# Patient Record
Sex: Female | Born: 1935 | Race: White | Hispanic: No | Smoking: Never smoker
Health system: Southern US, Community
[De-identification: ages and names within clinical notes are randomized; demographics above are authoritative.]

## PROBLEM LIST (undated history)

## (undated) DIAGNOSIS — C50919 Malignant neoplasm of unspecified site of unspecified female breast: Secondary | ICD-10-CM

## (undated) DIAGNOSIS — M810 Age-related osteoporosis without current pathological fracture: Secondary | ICD-10-CM

## (undated) DIAGNOSIS — I499 Cardiac arrhythmia, unspecified: Secondary | ICD-10-CM

## (undated) DIAGNOSIS — I509 Heart failure, unspecified: Secondary | ICD-10-CM

## (undated) DIAGNOSIS — D219 Benign neoplasm of connective and other soft tissue, unspecified: Secondary | ICD-10-CM

## (undated) DIAGNOSIS — N951 Menopausal and female climacteric states: Secondary | ICD-10-CM

## (undated) DIAGNOSIS — I4891 Unspecified atrial fibrillation: Secondary | ICD-10-CM

## (undated) DIAGNOSIS — R0600 Dyspnea, unspecified: Secondary | ICD-10-CM

## (undated) DIAGNOSIS — I2510 Atherosclerotic heart disease of native coronary artery without angina pectoris: Secondary | ICD-10-CM

## (undated) DIAGNOSIS — C801 Malignant (primary) neoplasm, unspecified: Secondary | ICD-10-CM

## (undated) DIAGNOSIS — M1990 Unspecified osteoarthritis, unspecified site: Secondary | ICD-10-CM

## (undated) DIAGNOSIS — M353 Polymyalgia rheumatica: Secondary | ICD-10-CM

## (undated) DIAGNOSIS — I10 Essential (primary) hypertension: Secondary | ICD-10-CM

## (undated) DIAGNOSIS — Z950 Presence of cardiac pacemaker: Secondary | ICD-10-CM

## (undated) HISTORY — DX: Unspecified atrial fibrillation: I48.91

## (undated) HISTORY — DX: Age-related osteoporosis without current pathological fracture: M81.0

## (undated) HISTORY — PX: BASAL CELL CARCINOMA EXCISION: SHX1214

## (undated) HISTORY — PX: INSERT / REPLACE / REMOVE PACEMAKER: SUR710

## (undated) HISTORY — DX: Menopausal and female climacteric states: N95.1

## (undated) HISTORY — DX: Essential (primary) hypertension: I10

## (undated) HISTORY — DX: Benign neoplasm of connective and other soft tissue, unspecified: D21.9

## (undated) HISTORY — DX: Polymyalgia rheumatica: M35.3

## (undated) HISTORY — DX: Malignant (primary) neoplasm, unspecified: C80.1

## (undated) HISTORY — DX: Malignant neoplasm of unspecified site of unspecified female breast: C50.919

## (undated) HISTORY — PX: SKIN BIOPSY: SHX1

## (undated) HISTORY — PX: CARDIAC ELECTROPHYSIOLOGY STUDY AND ABLATION: SHX1294

## (undated) HISTORY — PX: HYSTEROSCOPY: SHX211

---
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
## 1998-02-09 ENCOUNTER — Ambulatory Visit (HOSPITAL_COMMUNITY): Admission: RE | Admit: 1998-02-09 | Discharge: 1998-02-09 | Payer: Self-pay | Admitting: Obstetrics and Gynecology

## 1998-03-22 ENCOUNTER — Other Ambulatory Visit: Admission: RE | Admit: 1998-03-22 | Discharge: 1998-03-22 | Payer: Self-pay | Admitting: Obstetrics and Gynecology

## 1998-06-07 ENCOUNTER — Encounter: Payer: Self-pay | Admitting: Obstetrics and Gynecology

## 1998-06-07 ENCOUNTER — Ambulatory Visit (HOSPITAL_COMMUNITY): Admission: RE | Admit: 1998-06-07 | Discharge: 1998-06-07 | Payer: Self-pay | Admitting: Obstetrics and Gynecology

## 1999-01-17 ENCOUNTER — Other Ambulatory Visit: Admission: RE | Admit: 1999-01-17 | Discharge: 1999-01-17 | Payer: Self-pay | Admitting: Obstetrics and Gynecology

## 1999-09-22 ENCOUNTER — Inpatient Hospital Stay (HOSPITAL_COMMUNITY): Admission: RE | Admit: 1999-09-22 | Discharge: 1999-09-26 | Payer: Self-pay | Admitting: Internal Medicine

## 2000-01-19 ENCOUNTER — Other Ambulatory Visit: Admission: RE | Admit: 2000-01-19 | Discharge: 2000-01-19 | Payer: Self-pay | Admitting: Obstetrics and Gynecology

## 2000-07-24 ENCOUNTER — Ambulatory Visit (HOSPITAL_COMMUNITY): Admission: RE | Admit: 2000-07-24 | Discharge: 2000-07-24 | Payer: Self-pay | Admitting: Internal Medicine

## 2000-07-24 ENCOUNTER — Encounter: Payer: Self-pay | Admitting: Internal Medicine

## 2001-02-21 ENCOUNTER — Other Ambulatory Visit: Admission: RE | Admit: 2001-02-21 | Discharge: 2001-02-21 | Payer: Self-pay | Admitting: Obstetrics and Gynecology

## 2002-02-21 ENCOUNTER — Other Ambulatory Visit: Admission: RE | Admit: 2002-02-21 | Discharge: 2002-02-21 | Payer: Self-pay | Admitting: Obstetrics and Gynecology

## 2003-02-23 ENCOUNTER — Other Ambulatory Visit: Admission: RE | Admit: 2003-02-23 | Discharge: 2003-02-23 | Payer: Self-pay | Admitting: Obstetrics and Gynecology

## 2003-04-04 HISTORY — PX: CARDIAC CATHETERIZATION: SHX172

## 2005-03-02 ENCOUNTER — Other Ambulatory Visit: Admission: RE | Admit: 2005-03-02 | Discharge: 2005-03-02 | Payer: Self-pay | Admitting: Obstetrics and Gynecology

## 2005-12-14 ENCOUNTER — Ambulatory Visit: Payer: Self-pay | Admitting: Internal Medicine

## 2006-03-08 ENCOUNTER — Other Ambulatory Visit: Admission: RE | Admit: 2006-03-08 | Discharge: 2006-03-08 | Payer: Self-pay | Admitting: Obstetrics and Gynecology

## 2006-06-28 ENCOUNTER — Ambulatory Visit: Payer: Self-pay | Admitting: Internal Medicine

## 2006-08-06 ENCOUNTER — Ambulatory Visit: Payer: Self-pay | Admitting: Internal Medicine

## 2007-03-20 ENCOUNTER — Other Ambulatory Visit: Admission: RE | Admit: 2007-03-20 | Discharge: 2007-03-20 | Payer: Self-pay | Admitting: Obstetrics and Gynecology

## 2007-04-22 ENCOUNTER — Encounter: Payer: Self-pay | Admitting: Obstetrics and Gynecology

## 2007-04-22 ENCOUNTER — Ambulatory Visit (HOSPITAL_BASED_OUTPATIENT_CLINIC_OR_DEPARTMENT_OTHER): Admission: RE | Admit: 2007-04-22 | Discharge: 2007-04-22 | Payer: Self-pay | Admitting: Obstetrics and Gynecology

## 2008-07-13 ENCOUNTER — Encounter: Payer: Self-pay | Admitting: Obstetrics and Gynecology

## 2008-07-13 ENCOUNTER — Ambulatory Visit: Payer: Self-pay | Admitting: Obstetrics and Gynecology

## 2008-07-13 ENCOUNTER — Other Ambulatory Visit: Admission: RE | Admit: 2008-07-13 | Discharge: 2008-07-13 | Payer: Self-pay | Admitting: Obstetrics and Gynecology

## 2009-06-22 ENCOUNTER — Ambulatory Visit: Payer: Self-pay | Admitting: Obstetrics and Gynecology

## 2009-06-23 ENCOUNTER — Ambulatory Visit: Payer: Self-pay | Admitting: Obstetrics and Gynecology

## 2009-07-14 ENCOUNTER — Ambulatory Visit: Payer: Self-pay | Admitting: Obstetrics and Gynecology

## 2009-08-31 ENCOUNTER — Ambulatory Visit: Payer: Self-pay | Admitting: Obstetrics and Gynecology

## 2009-10-28 ENCOUNTER — Ambulatory Visit (HOSPITAL_BASED_OUTPATIENT_CLINIC_OR_DEPARTMENT_OTHER): Admission: RE | Admit: 2009-10-28 | Discharge: 2009-10-28 | Payer: Self-pay | Admitting: Obstetrics and Gynecology

## 2009-10-28 ENCOUNTER — Ambulatory Visit: Payer: Self-pay | Admitting: Obstetrics and Gynecology

## 2009-10-31 ENCOUNTER — Inpatient Hospital Stay: Payer: Self-pay | Admitting: Internal Medicine

## 2009-11-03 ENCOUNTER — Ambulatory Visit: Payer: Self-pay | Admitting: Obstetrics and Gynecology

## 2009-12-20 ENCOUNTER — Ambulatory Visit: Payer: Self-pay | Admitting: Unknown Physician Specialty

## 2009-12-22 LAB — PATHOLOGY REPORT

## 2010-06-18 LAB — BASIC METABOLIC PANEL
BUN: 24 mg/dL — ABNORMAL HIGH (ref 6–23)
CO2: 25 mEq/L (ref 19–32)
Calcium: 9.5 mg/dL (ref 8.4–10.5)
Chloride: 107 mEq/L (ref 96–112)
Creatinine, Ser: 0.99 mg/dL (ref 0.4–1.2)
GFR calc Af Amer: >60 mL/min (ref >60)
GFR calc non Af Amer: 54 mL/min — ABNORMAL LOW (ref >60)
Glucose, Bld: 109 mg/dL — ABNORMAL HIGH (ref 70–99)
Potassium: 4.8 mEq/L (ref 3.5–5.1)
Sodium: 138 mEq/L (ref 135–145)

## 2010-06-18 LAB — PROTIME-INR
INR: 1.27 (ref 0.00–1.49)
Prothrombin Time: 15.8 seconds — ABNORMAL HIGH (ref 11.6–15.2)

## 2010-06-18 LAB — POCT HEMOGLOBIN-HEMACUE: Hemoglobin: 15.7 g/dL — ABNORMAL HIGH (ref 12.0–15.0)

## 2010-06-18 LAB — APTT: aPTT: 27 seconds (ref 24–37)

## 2010-08-16 NOTE — Op Note (Signed)
[name], [name]             ACCOUNT NO.:  [id number]   MEDICAL RECORD NO.:  [id number]          PATIENT TYPE:  AMB   LOCATION:  [facility]                         FACILITY:  [facility]   PHYSICIAN:  Daniel L. Gottsegen, M.D.DATE OF BIRTH:  [birth year]   DATE OF PROCEDURE:  04/22/2007  DATE OF DISCHARGE:                               OPERATIVE REPORT   PREOPERATIVE DIAGNOSES:  Postmenopausal bleeding with endometrial polyp.   POSTOPERATIVE DIAGNOSIS:  Postmenopausal bleeding with endometrial  polyp.   OPERATIONS:  Hysteroscopy and excision of endometrial polyp.   SURGEON:  Dr. Eda Paschal.   ANESTHESIA:  General.   INDICATIONS:  The patient is a 77 year old, gravida 3 , para 3, AB 0 who  had presented to the office with postmenopausal bleeding.  She had been  on hormone replacement but had never had this prior. She underwent an  ultrasound in our office which showed an enlarged endometrial echo of  about 9-10 mm and a distinct endometrial polyp in the lower uterine  segment. As a result of this, she was taken to the operating room for  hysteroscopy, excision of polyp and evaluation to be sure there was not  another reason for her postmenopausal bleeding.   FINDINGS:  External is normal. BUS is normal.  Vaginal shows decreased  estrogen effect, cervix is clean.  The uterus is retroverted and small,  adnexa failed to reveal masses.  At the time of hysteroscopy, there was  a small endometrial polyp in the lower uterine segment of approximately  1 to 1-1/2 cm.  It was attached to the anterior wall.  After this was  removed, the top of the fundus, tubal ostia, anterior and posterior  walls of the fundus, lower uterine segment and endocervical canal were  evaluated and were all free of disease.   DESCRIPTION OF PROCEDURE:  After adequate general anesthesia, the  patient was placed in the dorsal lithotomy position, prepped and draped  in the usual sterile manner.  A single-tooth tenaculum  was placed in the  anterior lip of the cervix.  The cervix could be dilated to about a 27  Pratt dilator but after that even using a little more pressure than  normal but being careful not to perforate or lacerate the cervix, we  could not get further dilatation. An attempt at this point then was made  to insert a resectoscope. It could be inserted into the external os but  it could not get through the internal os. Even  trying to follow it in  with the 3% sorbitol running to try to dilate the internal os, it could  not be passed into the endometrial cavity, however, you could start to  see the endometrial polyp in the lower uterine segment when you got  close the internal os. At this point then, the resectoscope was  abandoned and using polyp forceps and a small endometrial curette  the  polyp appeared to be removed. There was significant tissue obtained.  We  then put a diagnostic hysteroscope in and were able to explore the  entire endometrial cavity.  We could seen no pathology  other than the  fact that there was still a small stalk of the polyp that had not been  completely removed with the Randall forceps or with the curette.  We  therefore switched to a [place] diagnostic scope which has a side operating  channel. Through that we could put a grasper and through this we could  grasp the remainder of the polyp stalk and remove it.  There was no  bleeding noted.  The fluid deficit was less than 100 mL.  There was some bleeding however from the single-tooth tenaculum.  Normally this would have probably been treated either with silver  nitrate or just time but because she has to go back on Coumadin we  cauterized this to stop the bleeding.  Total blood loss was minimal.  The patient left the operating room in satisfactory condition.      Daniel L. Eda Paschal, M.D.  Electronically Signed     DLG/MEDQ  D:  04/22/2007  T:  04/22/2007  Job:  [id number]

## 2010-08-19 NOTE — Discharge Summary (Signed)
[facility]  Patient:    [name], [name]                    MRN: [id number] Adm. Date:  [id number] Disc. Date: [id number] Attending:  Nathen May Dictator:   Delton See, P.A. CC:         Nathen May, M.D. [facility]             Diona Fanti, M.D., [place]             Dalia Heading, M.D.                           Discharge Summary  HISTORY ON ADMISSION:  This is a 77 year old female evaluated by Dr. Graciela Husbands in the office on September 06, 1999, for uncontrollable atrial arrhythmias.  Please refer to his complete office notes.  The patient is status post multiple ablations performed at [facility].  She previously was treated with sotalol, but her symptoms continued to worsen.  A decision was made to admit the patient for dofetilide therapy.  The patient is very symptomatic when she has her atrial arrhythmias.  Her symptoms include shortness of breath, chest pressure and pre-syncope.  PAST MEDICAL HISTORY:  Significant for hypertension, history of atrial arrhythmias.  She is status post cardiac catheterization at [facility].  She had a normal left ventricle with normal coronary arteries at that time except for mild irregularities of the LAD.  She has a history of uterine polyps.  She has an ankle fracture, skin cancer and breast biopsies. She had TB treated with INH therapy.  She has been on Coumadin for her atrial arrhythmias.  She also has rheumatoid arthritis.  ALLERGIES:  No known drug allergies.  MEDICATIONS AT TIME OF ADMISSION: 1. Coumadin 2.5 mg daily. 2. Provera 2.5 mg daily. 3. Ogen 0.325 mg daily. 4. Sotalol 120 mg b.i.d. 5. Multivitamins. 6. Folic acid. 7. Metoprolol 50 mg q.d.  SOCIAL HISTORY:  The patient is married.  She lives with her husband.  She is a retired Engineer, civil (consulting).  She does not use tobacco.  She drinks wine on occasion.  REVIEW OF SYSTEMS:  Essentially negative,  except for the symptoms as noted above associated with her rapid atrial arrhythmias.  PHYSICAL EXAMINATION:  GENERAL:  Revealed a pleasant 77 year old white female in no acute distress.  VITAL SIGNS:  Blood pressure 140/100, pulse 97.  HEENT:  Unremarkable.  NECK:  No bruits, no jugular venous distension.  HEART:  Regular rate and rhythm without murmur.  LUNGS:  Clear.  ABDOMEN:  Positive bowel sounds, soft, nontender.  EXTREMITIES:  Revealed pulses to be intact without edema.  HOSPITAL COURSE:  As noted, this patient was to be admitted to [facility] on September 20, 1999, for dofetilide therapy secondary to uncontrolled atrial arrhythmias.  She had been seen in the office by Dr. Graciela Husbands on September 06, 1999, and was seen by Dr. Andee Lineman shortly after admission.  Dr. Andee Lineman did not feel that the treatment with dofetilide was appropriate at this time, although I am not sure why that decision was made.  The patient was discharged in stable condition.  DISCHARGE MEDICATIONS:  To my knowledge the patient was discharged on the same medications she was on at time of admission, please see list as noted above.  IMPRESSION: 1. Uncontrolled atrial arrhythmias status post multiple ablations. 2. History  of normal coronary arteries and normal left ventricle at cardiac    catheterization performed at [facility]. 3. History of Coumadin therapy. 4. Hypertension. 5. Rheumatoid arthritis. 6. Status post multiple surgeries. 7. Remote tuberculosis history treated with appropriate antibiotics. DD:  10/07/99 TD:  10/07/99 Job: [id number]

## 2010-08-19 NOTE — Assessment & Plan Note (Signed)
[facility]                         ELECTROPHYSIOLOGY OFFICE NOTE   HANALEI, GLACE                    MRN:          [id number]  DATE:06/28/2006                            DOB:          [birth year]    [name] comes in today.  She has been feeling some better on her  current medications without feeling like her heart is bottoming out.  This had improved your new discontinuation of her Lanoxin and Toprol in  the fall.  She is back on Toprol 25 mg a day and she feels like her  heart rate does great for the morning and then it starts going fast in  the evening.   She is also concerned about her Coumadin and its impact on her diet,  having excluded most of what she would like to eat and, unfortunately,  she has also intercurrently been diagnosed with polymyalgia rheumatica  and has been put on prednisone and is feeling quite puffy.  She has put  on about 10 pounds, she says because of this, which is true from our  scales from September 2007 but is overall down from December 2003.   Her medication list is lesion and includes aldactone, Coumadin and  prednisone, Toprol 25 mg and a variety of nutraceuticals.   She has no known drug allergies.   PHYSICAL EXAMINATION:  VITAL SIGNS:  On examination today her blood  pressure is 122/78, pulse is 74.  LUNGS:  Clear.  CARDIAC:  Heart sounds were regular.  EXTREMITIES:  Without edema.   Her electrocardiogram demonstrated sinus rhythm at 74 with intervals of  0.13/0.07/0.38.  The axis was mildly leftward at -19.  Her ECG was  consistent with a prior septal infarct, which is old, and she apparently  has had a negative catheterization about 7 years ago.   IMPRESSION:  1. Atrial arrhythmias.  2. History of bradycardia with previously-planned pacemaker      implantation, which was deferred and for which she is now grateful.  3. Anticoagulation for #1 with an impact on her diet.  4. Polymyalgia  rheumatica with a sense of weight gain.   [name] has a variety of issues.  What we will try and do initially  is:  1. Increase her Toprol by having her take a small dose in the evening      at 25 mg to see if we can minimize her p.m. symptoms.  This is done      empirically.  2. Allow her to free up her diet, realizing that it may increase the      Coumadin requirements that she has.  3. Further, she will get her lipids and we will plan to review those      issues concerned about whether she should be on a statin or not.  4. She is planning to go to [place] in early May so we will plan to see      her again in 3 weeks' time.     Duke Salvia, MD, [facility]  Electronically Signed    SCK/MedQ  DD: 06/28/2006  DT: 06/28/2006  Job #: [id number]   cc:   Arnoldo Hooker, M.D.  Lavenia Atlas, M.D.

## 2010-08-19 NOTE — Letter (Signed)
December 14, 2005     Catherine Hooker, MD  [address]  [place], [place]  [address]   RE:  [name], [name]  MRN:  [id number]  /  DOB:  10-12-1926   Dear [name]:   It was a pleasure to [employer] again at your request about her  tachybrady syndrome.   As you know, she is a very pleasant 77 year old woman who with her husband  both think the world of you.  She has a long standing history of atrial  arrhythmias and underwent a complex ablation procedure with [facility] Chester,  MD down at [facility] years ago.  This was ultimately felt to be unsuccessful and  she was tried on a series of antiarrhythmic drugs to control her symptoms  for tachycardia.  She had some bradycardia but this was relatively  quiescent.  She was tried 1C agents as well as dofetilide;  ultimately these  were discontinued a couple of years back and she had been relatively symptom  free for the last 3-years.  She is able to play tennis, walk on her  treadmill, swim.  However, a couple of weeks ago she started having spells  of presyncope.  Holter monitor was undertaken which demonstrated rates  ranging from the 140s down to the 40s.  She was seen in your office at a 3.5  second pause as noted with sinus node dysfunction. Review of their tracings  demonstrates continued problems with this atrial arrhythmia with upright P-  waves in lead V1 and inferiorly consistent with a left atrial focus.  She  also has atrial fibrillations still.   You stopped her Toprol and her Lanoxin earlier this week and she has had no  recurrent spells of her light headedness.   She has no chest pain or shortness of breath.  She has long-standing  hypertension.   PAST MEDICAL HISTORY:  Her past medical history is otherwise largely  negative.   FAMILY HISTORY:  She comes from a long-standing stock with her parents  living to a total age of 190 years between the two of them.   PAST SURGICAL HISTORY:  Her past surgical history  is noted for breast  biopsy.   REVIEW OF SYSTEMS:  Non-contributory. It is notable for a remote history of  TB treated with INH.   CURRENT MEDICATIONS:  Include:  1. Aldactone 25.  2. Coumadin.  3. Fosamax.  4. Vitamin C.   ALLERGIES:  No known drug allergies.   EXAMINATION:  VITAL SIGNS:  Her blood pressure today was 136/93.  Her pulse  is 93.  HEENT:  Demonstrates no icterus or xanthomas.  NECK:  Neck veins were flat.  Carotids were brisk and full bilaterally  without bruits.  BACK:  Without kyphosis or scoliosis.  LUNGS:  Clear.  HEART:  Heart sounds were irregular with a 2/6 systolic murmur.  ABDOMEN:  Soft with active bowel sounds.  EXTREMITIES:  Femoral pulses were 2+, distal pulses were intact.  There is  no clubbing, cyanosis or edema.  NEUROLOGICAL:  Grossly normal.   Electrocardiograms from your office dated December 12, 2005,  demonstrated  left atrial flutter with P wave morphology as described above  with an  average heart rate of 75.  Strips from the day before on the December 11, 2005, demonstrated recurrent pausing in the setting of sinus rhythm.  Holter  monitoring from September 2005 demonstrated the rapid rates and slow rates  as previously described.   IMPRESSION:  1. Recurrent light headedness associated with documented sinus node      dysfunction and sinus pauses.  2. Tachycardia - multiple - status post ablation  of multiple arrhythmias      at [facility] with multiple residual arrhythmias and failed entry into drug      therapy.  3. Hypertension.   [name] has tachybrady and I concur with your thoughts that  tachybrady pacing is currently indicated.  She has had symptomatic tachy  palpitations in the past and has been remarkably well-controlled on a  combination of Toprol and Lanoxin; the fact that she is now having problems  with bradycardia, I think is probably evolution of her sinus node  dysfunction and backup brady pacing I think  is the only way to allow for  control of what will likely be symptomatic tachycardia but is already  concerning tachycardia with her heart rates on therapy, previously  demonstrated in early September to be over 130 or 140.   I have reviewed extensively with her and her husband the potential benefits  as well as potential risks including but not limited to death, perforation,  infection and lead dislodgement.  They understand these risks and would like  to proceed.  I stressed that this was a semi-elective procedure so they  could feel free to go to the beach for the week, that the bradycardia is the  more problematic portion of her tachybrady and that as long as she was not  having symptoms of light-headedness that  would put her at risk for falling  down, her tachy palpitations could be tolerated   Thank you very much for asking me to see her and we will plan to implant her  pacer the first week of October.    Sincerely,      Catherine Salvia, MD, [facility]   SCK/MedQ  DD:  12/14/2005  DT:  12/15/2005  Job #:  [id number]   CC:    [name]

## 2010-08-19 NOTE — Discharge Summary (Signed)
[name]  Patient:    [name], [name]                    MRN: [id number] Adm. Date:  [id number] Disc. Date: 09/26/99 Attending:  Nathen May Dictator:   Abelino Derrick, P.A.C. [facility] CC:         Nathen May, M.D., [facility]             Iantha Fallen A. Lady Gary, M.D., [address], [place], [place]                           Discharge Summary  DISCHARGE DIAGNOSES: 1. Atrial fibrillation, dofetilide instituted this admission. 2. Hypertension 3. Nonobstructive coronary disease by previous catheterization. 4. History of TB in the past treated with INH.  HOSPITAL COURSE:  The patient is a 77 year old female, followed by Dr. Lady Gary with atrial arrhythmias.  She was on sotalol most recently.  She was admitted September 22, 1999, for dofetilide therapy.  The patient was admitted to telemetry and started on Tikosyn per the pharmacy protocol.  She tolerated this well.  Dr. Graciela Husbands feels she can be discharged September 26, 1999.  We did add Aldactone to her medicines and increased her Toprol to 100 mg a day.  Her potassium was low during this admission and replaced but she needs a followup potassium check later this week at Dr. Don Perking office.  She will follow up with Dr. Graciela Husbands on October 13, 1999, at 2:15 p.m.  Her QTC at discharge is 453.  DISCHARGE MEDICATIONS: 1. Toprol XL 100 mg a day. 2. Aldactone 25 mg a day. 3. Dofetilide 250 mg twice a day. 4. Coumadin 2.5 mg a day. 5. Folic acid 1 mg a day. 6. Premarin 0.625 q. day. 7. Provera 2.5 q. day.  LABORATORY DATA:  Sodium 137, potassium 3.8, BUN 21, creatinine 0.8, INR is 2.7, white count 5.5. Hemoglobin 14.7, hematocrit 42.7, platelets 160.  DISPOSITION:  The patient is discharge in stable condition and will followup with Dr. Graciela Husbands on October 11, 1999, at 2:15 p.m., and have a potassium check later this week at Dr. Don Perking office. DD:  09/26/99 TD:  09/26/99 Job: [id number]

## 2010-08-19 NOTE — Procedures (Signed)
[place]  Patient:    [name], [name]                    MRN: [id number] Proc. Date: 07/24/00 Adm. Date:  [id number] Attending:  Nathen May                           Procedure Report  PREOPERATIVE DIAGNOSIS:  Atrial tachycardia.  POSTOPERATIVE DIAGNOSIS:  Sinus rhythm.  The patient was admitted for DC cardioversion.  The patients INR was 3.4.  Patient received a total of 150 mg of sodium pentothal under the care of Dr. Kipp Brood.  A 20-joule shock was delivered synchronously during atrial tachycardia and sinus rhythm was restored.  The patient was awakened at this time. DD:  07/24/00 TD:  07/24/00 Job: [id number]

## 2010-12-22 LAB — PROTIME-INR
INR: 1.3
Prothrombin Time: 16.5 — ABNORMAL HIGH

## 2010-12-22 LAB — BASIC METABOLIC PANEL
CO2: 27
Calcium: 9.5
Creatinine, Ser: 0.83
GFR calc Af Amer: >60
GFR calc non Af Amer: >60
Sodium: 142

## 2011-02-06 ENCOUNTER — Encounter: Payer: Self-pay | Admitting: Gynecology

## 2011-02-06 DIAGNOSIS — N951 Menopausal and female climacteric states: Secondary | ICD-10-CM | POA: Insufficient documentation

## 2011-02-20 ENCOUNTER — Other Ambulatory Visit (HOSPITAL_COMMUNITY)
Admission: RE | Admit: 2011-02-20 | Discharge: 2011-02-20 | Disposition: A | Discharge status: Home / Self Care | Payer: Medicare Other | Source: Ambulatory Visit | Attending: Obstetrics and Gynecology | Admitting: Obstetrics and Gynecology

## 2011-02-20 ENCOUNTER — Ambulatory Visit (INDEPENDENT_AMBULATORY_CARE_PROVIDER_SITE_OTHER): Payer: Medicare Other | Admitting: Obstetrics and Gynecology

## 2011-02-20 ENCOUNTER — Encounter: Payer: Self-pay | Admitting: Obstetrics and Gynecology

## 2011-02-20 VITALS — BP 124/74 | Ht 62.0 in | Wt 148.0 lb

## 2011-02-20 DIAGNOSIS — N950 Postmenopausal bleeding: Secondary | ICD-10-CM

## 2011-02-20 DIAGNOSIS — M899 Disorder of bone, unspecified: Secondary | ICD-10-CM

## 2011-02-20 DIAGNOSIS — M8580 Other specified disorders of bone density and structure, unspecified site: Secondary | ICD-10-CM

## 2011-02-20 DIAGNOSIS — R3911 Hesitancy of micturition: Secondary | ICD-10-CM

## 2011-02-20 DIAGNOSIS — Z124 Encounter for screening for malignant neoplasm of cervix: Secondary | ICD-10-CM | POA: Insufficient documentation

## 2011-02-20 DIAGNOSIS — N951 Menopausal and female climacteric states: Secondary | ICD-10-CM

## 2011-02-20 DIAGNOSIS — I4891 Unspecified atrial fibrillation: Secondary | ICD-10-CM

## 2011-02-20 DIAGNOSIS — Z780 Asymptomatic menopausal state: Secondary | ICD-10-CM

## 2011-02-20 MED ORDER — ESTRADIOL-LEVONORGESTREL 0.045-0.015 MG/DAY TD PTWK: 1.0000 | MEDICATED_PATCH | TRANSDERMAL | Status: DC

## 2011-02-20 NOTE — Progress Notes (Signed)
Subjective:     Patient ID: [name], female   DOB: Apr 14, 1926, 77 y.o.   MRN: [id number]  HPIpatient came back to see me today. When I last saw her she had postmenopausal bleeding. She had a previous endometrial polyp. This time when she had the bleeding hysteroscopy and D&C failed to reveal pathology. We stopped her HRT. The bleeding stopped. We thoroughly some of those related to her anticoagulation for atrial fibrillation. She now is having menopausal symptoms. She is a lack of energy. She doesn't feel as good. She doesn't really have night sweats. She is having no vaginal dryness. She wonders if she could go back on HRT. She also notices that her stream of urination isn't as strong. She has no dysuria, frequency, or urgency. She's had no hematuria. She was concerned that her bladder had dropped causing the above. She is due for a mammogram. She is due for bone density. She has low bone mass. She takes her calcium and vitamin D. She's had no fracture. She's had no bleeding now for one year vaginally.   Review of Systems  Constitutional: Negative.   HENT: Negative.   Eyes: Negative.   Respiratory: Negative.   Cardiovascular:       Atrial fibrillation treated with Cardizem and coumadin  Gastrointestinal: Negative.   Genitourinary:       See history of present illness  Musculoskeletal: Negative.   Skin: Negative.   Neurological: Negative.   Hematological: Negative.   Psychiatric/Behavioral: Negative.        Objective:   Physical ExamPhysical examination: Kennon Portela present HEENT within normal limits. Neck: Thyroid not large. No masses. Supraclavicular nodes: not enlarged. Breasts: Examined in both sitting midline position. No skin changes and no masses. Abdomen: Soft no guarding rebound or masses or hernia. Pelvic: External: Within normal limits. BUS: Within normal limits. Vaginal:within normal limits. Poor estrogen effect. No evidence of cystocele rectocele or enterocele.  Cervix: clean. Uterus: Normal size and shape. Adnexa: No masses. Rectovaginal exam: Confirmatory and negative. Extremities: Within normal limits.    Assessment:    #1. Menopausal symptoms #2. Postmenopausal bleeding #3. Mild atrophic vaginitis #4. Low bone mass #5. Loss of stream with urination    Plan:     we discussed in detail reinitiating HRT. All risks including DVT, CVA, and breast cancer discussed. Patient would like to try it. We started her on Climara pro. She was warned about bleeding. She was scheduled bone density and mammogram. She will see a urologist regarding her loss of urine stream.

## 2011-03-16 ENCOUNTER — Telehealth: Payer: Self-pay | Admitting: *Deleted

## 2011-03-16 NOTE — Telephone Encounter (Signed)
Telephone call to review problem of bleeding for 3 days, changing pad every 2-3 hours dark blood. Had been off hormones x1 year with no bleeding. Started back on Climara pro patch one month ago for symptom relief. States has felt better on HRT but does not like bleeding. Also on Coumadin. Due to put a new patch on tomorrow, will take off patch today, will see if bleeding slows or stops. Will call tomorrow with an update. Reviewed may need medication to make bleeding stop.

## 2011-03-16 NOTE — Telephone Encounter (Signed)
Pt called c/o bleeding x 3 days now. Last office visit on 02/20/11 and was given climarapro 0.045-0.015 mg to take. The bleeding is heavy but not gusting blood, no cramping nor other symptoms. Pt wants to know if she should continue the patch or stop? Please advise

## 2011-03-17 ENCOUNTER — Other Ambulatory Visit: Payer: Self-pay | Admitting: Women's Health

## 2011-03-17 MED ORDER — MEGESTROL ACETATE 20 MG PO TABS: 20.0000 mg | ORAL_TABLET | Freq: Two times a day (BID) | ORAL | Status: AC

## 2011-03-17 NOTE — Telephone Encounter (Signed)
Telephone call for followup. States continues to bleed changing a pad every 2-3 hours. No clots blood is mostly dark. Has taken Megace in the past without a problem. Megace 20 mg twice a day until bleeding stops. Will followup with Dr. Eda Paschal for HRT. Instructed to call if bleeding does not stop.

## 2011-03-20 NOTE — Telephone Encounter (Signed)
Telephone call for followup. States bleeding stopped yesterday after 2 days of Megace. None today states is feeling much better. Will continue  Megace for 5 days. Will schedule followup with Dr. Eda Paschal or call if bleeding would start again.

## 2011-03-30 ENCOUNTER — Encounter: Payer: Self-pay | Admitting: Obstetrics and Gynecology

## 2011-04-04 DIAGNOSIS — Z23 Encounter for immunization: Secondary | ICD-10-CM | POA: Diagnosis not present

## 2011-04-24 DIAGNOSIS — I4891 Unspecified atrial fibrillation: Secondary | ICD-10-CM | POA: Diagnosis not present

## 2011-04-24 DIAGNOSIS — E538 Deficiency of other specified B group vitamins: Secondary | ICD-10-CM | POA: Diagnosis not present

## 2011-05-03 DIAGNOSIS — I4891 Unspecified atrial fibrillation: Secondary | ICD-10-CM | POA: Diagnosis not present

## 2011-06-22 DIAGNOSIS — I4891 Unspecified atrial fibrillation: Secondary | ICD-10-CM | POA: Diagnosis not present

## 2011-07-10 DIAGNOSIS — Z23 Encounter for immunization: Secondary | ICD-10-CM | POA: Diagnosis not present

## 2011-07-10 DIAGNOSIS — I10 Essential (primary) hypertension: Secondary | ICD-10-CM | POA: Diagnosis not present

## 2011-07-10 DIAGNOSIS — E538 Deficiency of other specified B group vitamins: Secondary | ICD-10-CM | POA: Diagnosis not present

## 2011-07-10 DIAGNOSIS — I4891 Unspecified atrial fibrillation: Secondary | ICD-10-CM | POA: Diagnosis not present

## 2011-07-10 DIAGNOSIS — M129 Arthropathy, unspecified: Secondary | ICD-10-CM | POA: Diagnosis not present

## 2011-07-11 DIAGNOSIS — I10 Essential (primary) hypertension: Secondary | ICD-10-CM | POA: Diagnosis not present

## 2011-07-11 DIAGNOSIS — M810 Age-related osteoporosis without current pathological fracture: Secondary | ICD-10-CM | POA: Diagnosis not present

## 2011-07-11 DIAGNOSIS — M129 Arthropathy, unspecified: Secondary | ICD-10-CM | POA: Diagnosis not present

## 2011-07-11 DIAGNOSIS — E538 Deficiency of other specified B group vitamins: Secondary | ICD-10-CM | POA: Diagnosis not present

## 2011-07-11 DIAGNOSIS — I4891 Unspecified atrial fibrillation: Secondary | ICD-10-CM | POA: Diagnosis not present

## 2011-08-17 DIAGNOSIS — E538 Deficiency of other specified B group vitamins: Secondary | ICD-10-CM | POA: Diagnosis not present

## 2011-08-21 DIAGNOSIS — I4891 Unspecified atrial fibrillation: Secondary | ICD-10-CM | POA: Diagnosis not present

## 2011-09-14 DIAGNOSIS — I059 Rheumatic mitral valve disease, unspecified: Secondary | ICD-10-CM | POA: Diagnosis not present

## 2011-09-14 DIAGNOSIS — E782 Mixed hyperlipidemia: Secondary | ICD-10-CM | POA: Diagnosis not present

## 2011-09-14 DIAGNOSIS — I4891 Unspecified atrial fibrillation: Secondary | ICD-10-CM | POA: Diagnosis not present

## 2011-09-14 DIAGNOSIS — I4892 Unspecified atrial flutter: Secondary | ICD-10-CM | POA: Diagnosis not present

## 2011-09-14 DIAGNOSIS — I10 Essential (primary) hypertension: Secondary | ICD-10-CM | POA: Diagnosis not present

## 2011-09-19 DIAGNOSIS — E538 Deficiency of other specified B group vitamins: Secondary | ICD-10-CM | POA: Diagnosis not present

## 2011-10-20 DIAGNOSIS — E538 Deficiency of other specified B group vitamins: Secondary | ICD-10-CM | POA: Diagnosis not present

## 2011-11-06 DIAGNOSIS — I4891 Unspecified atrial fibrillation: Secondary | ICD-10-CM | POA: Diagnosis not present

## 2011-11-23 DIAGNOSIS — E538 Deficiency of other specified B group vitamins: Secondary | ICD-10-CM | POA: Diagnosis not present

## 2011-12-14 DIAGNOSIS — I4891 Unspecified atrial fibrillation: Secondary | ICD-10-CM | POA: Diagnosis not present

## 2012-01-02 ENCOUNTER — Other Ambulatory Visit: Payer: Self-pay | Admitting: *Deleted

## 2012-01-02 DIAGNOSIS — M8580 Other specified disorders of bone density and structure, unspecified site: Secondary | ICD-10-CM

## 2012-01-03 DIAGNOSIS — Z23 Encounter for immunization: Secondary | ICD-10-CM | POA: Diagnosis not present

## 2012-01-08 DIAGNOSIS — E538 Deficiency of other specified B group vitamins: Secondary | ICD-10-CM | POA: Diagnosis not present

## 2012-01-08 DIAGNOSIS — I4891 Unspecified atrial fibrillation: Secondary | ICD-10-CM | POA: Diagnosis not present

## 2012-01-09 DIAGNOSIS — M949 Disorder of cartilage, unspecified: Secondary | ICD-10-CM | POA: Diagnosis not present

## 2012-01-09 DIAGNOSIS — M899 Disorder of bone, unspecified: Secondary | ICD-10-CM | POA: Diagnosis not present

## 2012-01-09 DIAGNOSIS — Z1231 Encounter for screening mammogram for malignant neoplasm of breast: Secondary | ICD-10-CM | POA: Diagnosis not present

## 2012-01-11 ENCOUNTER — Encounter: Payer: Self-pay | Admitting: Obstetrics and Gynecology

## 2012-01-11 ENCOUNTER — Other Ambulatory Visit: Payer: Self-pay | Admitting: Obstetrics and Gynecology

## 2012-01-11 DIAGNOSIS — M8580 Other specified disorders of bone density and structure, unspecified site: Secondary | ICD-10-CM

## 2012-01-29 DIAGNOSIS — L578 Other skin changes due to chronic exposure to nonionizing radiation: Secondary | ICD-10-CM | POA: Diagnosis not present

## 2012-01-29 DIAGNOSIS — L820 Inflamed seborrheic keratosis: Secondary | ICD-10-CM | POA: Diagnosis not present

## 2012-01-29 DIAGNOSIS — D485 Neoplasm of uncertain behavior of skin: Secondary | ICD-10-CM | POA: Diagnosis not present

## 2012-01-29 DIAGNOSIS — L821 Other seborrheic keratosis: Secondary | ICD-10-CM | POA: Diagnosis not present

## 2012-01-29 DIAGNOSIS — Z85828 Personal history of other malignant neoplasm of skin: Secondary | ICD-10-CM | POA: Diagnosis not present

## 2012-02-19 DIAGNOSIS — E538 Deficiency of other specified B group vitamins: Secondary | ICD-10-CM | POA: Diagnosis not present

## 2012-02-21 ENCOUNTER — Ambulatory Visit (INDEPENDENT_AMBULATORY_CARE_PROVIDER_SITE_OTHER): Payer: Medicare Other | Admitting: Obstetrics and Gynecology

## 2012-02-21 ENCOUNTER — Encounter: Payer: Self-pay | Admitting: Obstetrics and Gynecology

## 2012-02-21 VITALS — BP 120/76 | Ht 62.0 in | Wt 147.0 lb

## 2012-02-21 DIAGNOSIS — R5381 Other malaise: Secondary | ICD-10-CM

## 2012-02-21 DIAGNOSIS — I10 Essential (primary) hypertension: Secondary | ICD-10-CM | POA: Insufficient documentation

## 2012-02-21 DIAGNOSIS — R5383 Other fatigue: Secondary | ICD-10-CM | POA: Diagnosis not present

## 2012-02-21 DIAGNOSIS — N950 Postmenopausal bleeding: Secondary | ICD-10-CM

## 2012-02-21 DIAGNOSIS — Z780 Asymptomatic menopausal state: Secondary | ICD-10-CM

## 2012-02-21 DIAGNOSIS — M810 Age-related osteoporosis without current pathological fracture: Secondary | ICD-10-CM | POA: Diagnosis not present

## 2012-02-21 MED ORDER — MEDROXYPROGESTERONE ACETATE 2.5 MG PO TABS: 2.5000 mg | ORAL_TABLET | Freq: Every day | ORAL | Status: DC

## 2012-02-21 MED ORDER — ESTRADIOL 0.025 MG/24HR TD PTWK: 1.0000 | MEDICATED_PATCH | TRANSDERMAL | Status: DC

## 2012-02-21 NOTE — Patient Instructions (Signed)
We will call you with lab work 

## 2012-02-21 NOTE — Progress Notes (Signed)
Patient came back to see me today for further follow up. She stopped her HRT after all bleeding issues that we went through.( see record). She is having no bleeding now but does not feel as good off her HRT.She notices more fatigue, joint pain and feels she is less focused. She is having no pelvic pain. Her last bone density showed continued bone loss that was statistically significant. Her diagnosis is osteoporosis with a T score of -2.5. She had been on Fosamax for greater than 10  years. She has been on drug holiday for 3 years. She has had no fractures. She has never had an abnormal Pap smear. Her last Pap smear was 2012.She just had her yearly mammogram. She continues to be on Coumadin due to atrial fibrillation.  She is generally taking 2 mg daily. She is having no dysuria, urgency of urination, or frequency of urination.  ROS: 12 system review done. Pertinent positives above.Other positive is hypertension.  Physical examination:[name] present. HEENT within normal limits. Neck: Thyroid not large. No masses. Supraclavicular nodes: not enlarged. Breasts: Examined in both sitting and lying  position. No skin changes and no masses. Abdomen: Soft no guarding rebound or masses or hernia. Pelvic: External: Within normal limits. BUS: Within normal limits. Vaginal:within normal limits. Good estrogen effect. No evidence of cystocele rectocele or enterocele. Cervix: clean. Uterus: Normal size and shape. Adnexa: No masses. Rectovaginal exam: Confirmatory and negative. Extremities: Within normal limits.  Assessment: #1. Postmenopausal bleeding-now resolved #2. Menopausal symptoms including fatigue #3. Osteoporosis  Plan: Appropriate lab done for her osteoporosis. Recommend Prolia. We will get her approved after lab work. Written information on Prolia given. Discussed issues with HRT at 85 including DVT, stroke and increased risk of breast cancer. Elected to restart her on the lowest dose estrogen patch  with daily progestin. She will not start it without her cardiologist's consent. Pap not done.The new Pap smear guidelines were discussed with the patient. Consider estrogen-serm when available next year Although that would be oral estrogen.

## 2012-02-22 LAB — VITAMIN D 25 HYDROXY (VIT D DEFICIENCY, FRACTURES): Vit D, 25-Hydroxy: 40 ng/mL (ref 30–89)

## 2012-02-23 ENCOUNTER — Other Ambulatory Visit: Payer: Self-pay | Admitting: Obstetrics and Gynecology

## 2012-02-23 DIAGNOSIS — M898X9 Other specified disorders of bone, unspecified site: Secondary | ICD-10-CM

## 2012-02-28 ENCOUNTER — Other Ambulatory Visit: Payer: Medicare Other

## 2012-02-28 DIAGNOSIS — M948X9 Other specified disorders of cartilage, unspecified sites: Secondary | ICD-10-CM | POA: Diagnosis not present

## 2012-02-28 DIAGNOSIS — M898X9 Other specified disorders of bone, unspecified site: Secondary | ICD-10-CM

## 2012-03-01 LAB — PTH, INTACT AND CALCIUM: PTH: 145.2 pg/mL — ABNORMAL HIGH (ref 14.0–72.0)

## 2012-03-05 ENCOUNTER — Telehealth: Payer: Self-pay | Admitting: *Deleted

## 2012-03-05 NOTE — Telephone Encounter (Signed)
Office notes faxed to [facility] physicians for endocrinologist per Dr.G result note. For elevated PTH level.

## 2012-03-12 DIAGNOSIS — I4891 Unspecified atrial fibrillation: Secondary | ICD-10-CM | POA: Diagnosis not present

## 2012-03-12 DIAGNOSIS — I10 Essential (primary) hypertension: Secondary | ICD-10-CM | POA: Diagnosis not present

## 2012-03-12 DIAGNOSIS — I4892 Unspecified atrial flutter: Secondary | ICD-10-CM | POA: Diagnosis not present

## 2012-03-12 DIAGNOSIS — I495 Sick sinus syndrome: Secondary | ICD-10-CM | POA: Diagnosis not present

## 2012-03-12 DIAGNOSIS — I059 Rheumatic mitral valve disease, unspecified: Secondary | ICD-10-CM | POA: Diagnosis not present

## 2012-03-13 NOTE — Telephone Encounter (Signed)
Spoke with care coordinator at office and pt does not have appointment scheduled yet, care coordinator has pt on cancellation list and will contact pt when OV set.

## 2012-03-20 NOTE — Telephone Encounter (Signed)
Pt appt on Jan 13th @ 3:00 pm

## 2012-03-22 DIAGNOSIS — E538 Deficiency of other specified B group vitamins: Secondary | ICD-10-CM | POA: Diagnosis not present

## 2012-04-09 DIAGNOSIS — I4891 Unspecified atrial fibrillation: Secondary | ICD-10-CM | POA: Diagnosis not present

## 2012-04-15 DIAGNOSIS — I4891 Unspecified atrial fibrillation: Secondary | ICD-10-CM | POA: Diagnosis not present

## 2012-04-15 DIAGNOSIS — E215 Disorder of parathyroid gland, unspecified: Secondary | ICD-10-CM | POA: Diagnosis not present

## 2012-04-15 DIAGNOSIS — M810 Age-related osteoporosis without current pathological fracture: Secondary | ICD-10-CM | POA: Diagnosis not present

## 2012-04-30 DIAGNOSIS — E538 Deficiency of other specified B group vitamins: Secondary | ICD-10-CM | POA: Diagnosis not present

## 2012-04-30 DIAGNOSIS — I4891 Unspecified atrial fibrillation: Secondary | ICD-10-CM | POA: Diagnosis not present

## 2012-06-12 DIAGNOSIS — I4891 Unspecified atrial fibrillation: Secondary | ICD-10-CM | POA: Diagnosis not present

## 2012-06-12 DIAGNOSIS — E538 Deficiency of other specified B group vitamins: Secondary | ICD-10-CM | POA: Diagnosis not present

## 2012-07-01 DIAGNOSIS — L578 Other skin changes due to chronic exposure to nonionizing radiation: Secondary | ICD-10-CM | POA: Diagnosis not present

## 2012-07-01 DIAGNOSIS — L820 Inflamed seborrheic keratosis: Secondary | ICD-10-CM | POA: Diagnosis not present

## 2012-07-01 DIAGNOSIS — Z85828 Personal history of other malignant neoplasm of skin: Secondary | ICD-10-CM | POA: Diagnosis not present

## 2012-07-01 DIAGNOSIS — L821 Other seborrheic keratosis: Secondary | ICD-10-CM | POA: Diagnosis not present

## 2012-07-01 DIAGNOSIS — D485 Neoplasm of uncertain behavior of skin: Secondary | ICD-10-CM | POA: Diagnosis not present

## 2012-07-01 DIAGNOSIS — C44319 Basal cell carcinoma of skin of other parts of face: Secondary | ICD-10-CM | POA: Diagnosis not present

## 2012-07-16 DIAGNOSIS — I4891 Unspecified atrial fibrillation: Secondary | ICD-10-CM | POA: Diagnosis not present

## 2012-07-16 DIAGNOSIS — E538 Deficiency of other specified B group vitamins: Secondary | ICD-10-CM | POA: Diagnosis not present

## 2012-08-21 DIAGNOSIS — E538 Deficiency of other specified B group vitamins: Secondary | ICD-10-CM | POA: Diagnosis not present

## 2012-08-21 DIAGNOSIS — I4891 Unspecified atrial fibrillation: Secondary | ICD-10-CM | POA: Diagnosis not present

## 2012-09-24 DIAGNOSIS — I4891 Unspecified atrial fibrillation: Secondary | ICD-10-CM | POA: Diagnosis not present

## 2012-09-24 DIAGNOSIS — I119 Hypertensive heart disease without heart failure: Secondary | ICD-10-CM | POA: Diagnosis not present

## 2012-09-24 DIAGNOSIS — E782 Mixed hyperlipidemia: Secondary | ICD-10-CM | POA: Diagnosis not present

## 2012-09-24 DIAGNOSIS — I4892 Unspecified atrial flutter: Secondary | ICD-10-CM | POA: Diagnosis not present

## 2012-10-13 DIAGNOSIS — I495 Sick sinus syndrome: Secondary | ICD-10-CM | POA: Diagnosis not present

## 2012-10-13 DIAGNOSIS — I214 Non-ST elevation (NSTEMI) myocardial infarction: Secondary | ICD-10-CM | POA: Diagnosis not present

## 2012-10-13 DIAGNOSIS — I4891 Unspecified atrial fibrillation: Secondary | ICD-10-CM | POA: Diagnosis not present

## 2012-10-13 DIAGNOSIS — R0789 Other chest pain: Secondary | ICD-10-CM | POA: Diagnosis not present

## 2012-10-13 DIAGNOSIS — R079 Chest pain, unspecified: Secondary | ICD-10-CM | POA: Diagnosis not present

## 2012-10-13 DIAGNOSIS — Z950 Presence of cardiac pacemaker: Secondary | ICD-10-CM | POA: Diagnosis not present

## 2012-10-13 DIAGNOSIS — Z7901 Long term (current) use of anticoagulants: Secondary | ICD-10-CM | POA: Diagnosis not present

## 2012-10-13 DIAGNOSIS — I10 Essential (primary) hypertension: Secondary | ICD-10-CM | POA: Diagnosis not present

## 2012-10-13 DIAGNOSIS — R110 Nausea: Secondary | ICD-10-CM | POA: Diagnosis not present

## 2012-11-04 DIAGNOSIS — I4891 Unspecified atrial fibrillation: Secondary | ICD-10-CM | POA: Diagnosis not present

## 2012-12-18 DIAGNOSIS — I4891 Unspecified atrial fibrillation: Secondary | ICD-10-CM | POA: Diagnosis not present

## 2013-01-09 DIAGNOSIS — I4891 Unspecified atrial fibrillation: Secondary | ICD-10-CM | POA: Diagnosis not present

## 2013-01-21 DIAGNOSIS — Z1231 Encounter for screening mammogram for malignant neoplasm of breast: Secondary | ICD-10-CM | POA: Diagnosis not present

## 2013-01-23 ENCOUNTER — Encounter: Payer: Self-pay | Admitting: Gynecology

## 2013-01-28 DIAGNOSIS — L820 Inflamed seborrheic keratosis: Secondary | ICD-10-CM | POA: Diagnosis not present

## 2013-01-28 DIAGNOSIS — L821 Other seborrheic keratosis: Secondary | ICD-10-CM | POA: Diagnosis not present

## 2013-01-28 DIAGNOSIS — D239 Other benign neoplasm of skin, unspecified: Secondary | ICD-10-CM | POA: Diagnosis not present

## 2013-01-28 DIAGNOSIS — Z85828 Personal history of other malignant neoplasm of skin: Secondary | ICD-10-CM | POA: Diagnosis not present

## 2013-01-28 DIAGNOSIS — L578 Other skin changes due to chronic exposure to nonionizing radiation: Secondary | ICD-10-CM | POA: Diagnosis not present

## 2013-02-24 ENCOUNTER — Encounter: Payer: Self-pay | Admitting: Gynecology

## 2013-02-24 ENCOUNTER — Ambulatory Visit (INDEPENDENT_AMBULATORY_CARE_PROVIDER_SITE_OTHER): Payer: Medicare Other | Admitting: Gynecology

## 2013-02-24 VITALS — BP 122/78 | Ht 63.0 in | Wt 155.0 lb

## 2013-02-24 DIAGNOSIS — N952 Postmenopausal atrophic vaginitis: Secondary | ICD-10-CM | POA: Diagnosis not present

## 2013-02-24 DIAGNOSIS — M810 Age-related osteoporosis without current pathological fracture: Secondary | ICD-10-CM

## 2013-02-24 DIAGNOSIS — K649 Unspecified hemorrhoids: Secondary | ICD-10-CM | POA: Diagnosis not present

## 2013-02-24 DIAGNOSIS — N951 Menopausal and female climacteric states: Secondary | ICD-10-CM

## 2013-02-24 NOTE — Progress Notes (Signed)
[name] [birth year] [id number]        79 y.o.  G3P3003 for followup exam.  Former patient of Dr. Eda Paschal. Several issues noted below.  Past medical history,surgical history, problem list, medications, allergies, family history and social history were all reviewed and documented in the EPIC chart.  ROS:  Performed and pertinent positives and negatives are included in the history, assessment and plan .  Exam: Kim assistant Filed Vitals:   02/24/13 1449  BP: 122/78  Height: 5\' 3"  (1.6 m)  Weight: 155 lb (70.308 kg)   General appearance  Normal Skin grossly normal Head/Neck normal with no cervical or supraclavicular adenopathy thyroid normal Lungs  clear Cardiac RR, without RMG Abdominal  soft, nontender, without masses, organomegaly or hernia Breasts  examined lying and sitting without masses, retractions, discharge or axillary adenopathy. Pelvic  Ext/BUS/vagina  normal with atrophic changes  Cervix  normal with atrophic changes  Uterus  anteverted, normal size, shape and contour, midline and mobile nontender   Adnexa  Without masses or tenderness    Anus and perineum  normal   Rectovaginal  normal sphincter tone without palpated masses or tenderness. Sternal hemorrhoids   Assessment/Plan:  79 y.o. G3P3003 female for followup exam.   1. Menopausal/menopausal symptoms.  Patient complains of menopausal symptoms. Had been on HRT but discontinued due to irregular bleeding. Still having some hot flashes and night sweats. We've reviewed the risks benefits of HRT and at age 14 with atrial fibrillation on Coumadin it was more prudent to stay off of this and she agrees. 2. Osteoporosis. Most recent DEXA 2013 with T score -2.5. Does have a history of being on Fosamax for greater than 10 years and was on a 3 year drug-free holiday. She showed deterioration on a followup DEXA. She discussed Prolia with Dr. Eda Paschal ended up not starting this. I reviewed her fracture risk and my  recommendation to consider Prolia. Risks/benefits reviewed. Osteonecrosis of the jaw, atypical fracture, rashes and infections reviewed. Patient wants to go ahead and start we'll make arrangements to do so. 3. External hemorrhoids. Not overly bothersome to the patient. We'll continue to monitor. 4. Mammography 01/2013. Continue with annual mammography. SBE monthly reviewed. 5. Pap smear 2012. No Pap smear done today. No history of significant abnormal Pap smears. Current screening guidelines discussed options to stop screening altogether as she is over the age of 65% less frequent screening intervals discussed. We'll readdress on an annual basis. 6. Colonoscopy 2011. Repeat at their recommended interval. 7. Health maintenance. No blood work done as this is done through her other physician's offices. Followup one year, sooner as needed.   Note: This document was prepared with digital dictation and possible smart phrase technology. Any transcriptional errors that result from this process are unintentional.   Catherine Lords MD, 3:29 PM 02/24/2013

## 2013-02-24 NOTE — Patient Instructions (Signed)
Office will call you to arrange for Prolia. Followup in one year for annual exam.

## 2013-03-05 ENCOUNTER — Encounter: Payer: Self-pay | Admitting: Obstetrics and Gynecology

## 2013-03-13 ENCOUNTER — Telehealth: Payer: Self-pay | Admitting: *Deleted

## 2013-03-13 NOTE — Telephone Encounter (Signed)
Pt was called with Prolia benefits. Pts estimated out of pocket is $0 except for deductible cost which has been met. No PA needed. Pt will call back to schedule. [facility]

## 2013-03-20 DIAGNOSIS — I119 Hypertensive heart disease without heart failure: Secondary | ICD-10-CM | POA: Diagnosis not present

## 2013-03-20 DIAGNOSIS — I059 Rheumatic mitral valve disease, unspecified: Secondary | ICD-10-CM | POA: Diagnosis not present

## 2013-03-20 DIAGNOSIS — I4891 Unspecified atrial fibrillation: Secondary | ICD-10-CM | POA: Diagnosis not present

## 2013-03-20 DIAGNOSIS — R0602 Shortness of breath: Secondary | ICD-10-CM | POA: Diagnosis not present

## 2013-03-20 DIAGNOSIS — I4892 Unspecified atrial flutter: Secondary | ICD-10-CM | POA: Diagnosis not present

## 2013-04-01 ENCOUNTER — Ambulatory Visit: Payer: Medicare Other

## 2013-04-03 DIAGNOSIS — M810 Age-related osteoporosis without current pathological fracture: Secondary | ICD-10-CM

## 2013-04-03 HISTORY — DX: Age-related osteoporosis without current pathological fracture: M81.0

## 2013-04-11 DIAGNOSIS — Z23 Encounter for immunization: Secondary | ICD-10-CM | POA: Diagnosis not present

## 2013-04-16 DIAGNOSIS — M1710 Unilateral primary osteoarthritis, unspecified knee: Secondary | ICD-10-CM | POA: Diagnosis not present

## 2013-04-16 DIAGNOSIS — R0602 Shortness of breath: Secondary | ICD-10-CM | POA: Diagnosis not present

## 2013-04-16 DIAGNOSIS — IMO0002 Reserved for concepts with insufficient information to code with codable children: Secondary | ICD-10-CM | POA: Diagnosis not present

## 2013-04-28 DIAGNOSIS — I4891 Unspecified atrial fibrillation: Secondary | ICD-10-CM | POA: Diagnosis not present

## 2013-04-28 DIAGNOSIS — L659 Nonscarring hair loss, unspecified: Secondary | ICD-10-CM | POA: Diagnosis not present

## 2013-04-29 DIAGNOSIS — IMO0002 Reserved for concepts with insufficient information to code with codable children: Secondary | ICD-10-CM | POA: Diagnosis not present

## 2013-04-29 DIAGNOSIS — M1710 Unilateral primary osteoarthritis, unspecified knee: Secondary | ICD-10-CM | POA: Diagnosis not present

## 2013-06-17 DIAGNOSIS — M129 Arthropathy, unspecified: Secondary | ICD-10-CM | POA: Diagnosis not present

## 2013-06-17 DIAGNOSIS — M810 Age-related osteoporosis without current pathological fracture: Secondary | ICD-10-CM | POA: Diagnosis not present

## 2013-06-17 DIAGNOSIS — I4891 Unspecified atrial fibrillation: Secondary | ICD-10-CM | POA: Diagnosis not present

## 2013-06-17 DIAGNOSIS — I10 Essential (primary) hypertension: Secondary | ICD-10-CM | POA: Diagnosis not present

## 2013-06-17 DIAGNOSIS — E538 Deficiency of other specified B group vitamins: Secondary | ICD-10-CM | POA: Diagnosis not present

## 2013-06-18 DIAGNOSIS — I4891 Unspecified atrial fibrillation: Secondary | ICD-10-CM | POA: Diagnosis not present

## 2013-06-18 DIAGNOSIS — E538 Deficiency of other specified B group vitamins: Secondary | ICD-10-CM | POA: Diagnosis not present

## 2013-06-18 DIAGNOSIS — I10 Essential (primary) hypertension: Secondary | ICD-10-CM | POA: Diagnosis not present

## 2013-07-21 DIAGNOSIS — I4891 Unspecified atrial fibrillation: Secondary | ICD-10-CM | POA: Diagnosis not present

## 2013-09-03 DIAGNOSIS — I4891 Unspecified atrial fibrillation: Secondary | ICD-10-CM | POA: Diagnosis not present

## 2013-09-19 DIAGNOSIS — N189 Chronic kidney disease, unspecified: Secondary | ICD-10-CM | POA: Insufficient documentation

## 2013-09-19 DIAGNOSIS — IMO0001 Reserved for inherently not codable concepts without codable children: Secondary | ICD-10-CM | POA: Insufficient documentation

## 2013-09-19 DIAGNOSIS — Z0389 Encounter for observation for other suspected diseases and conditions ruled out: Secondary | ICD-10-CM

## 2013-09-25 DIAGNOSIS — N189 Chronic kidney disease, unspecified: Secondary | ICD-10-CM | POA: Diagnosis not present

## 2013-09-25 DIAGNOSIS — I4891 Unspecified atrial fibrillation: Secondary | ICD-10-CM | POA: Diagnosis not present

## 2013-09-25 DIAGNOSIS — I38 Endocarditis, valve unspecified: Secondary | ICD-10-CM | POA: Diagnosis not present

## 2013-09-25 DIAGNOSIS — I10 Essential (primary) hypertension: Secondary | ICD-10-CM | POA: Diagnosis not present

## 2013-09-25 DIAGNOSIS — I495 Sick sinus syndrome: Secondary | ICD-10-CM | POA: Diagnosis not present

## 2013-10-31 DIAGNOSIS — I4891 Unspecified atrial fibrillation: Secondary | ICD-10-CM | POA: Diagnosis not present

## 2013-12-16 DIAGNOSIS — E538 Deficiency of other specified B group vitamins: Secondary | ICD-10-CM | POA: Diagnosis not present

## 2013-12-16 DIAGNOSIS — I10 Essential (primary) hypertension: Secondary | ICD-10-CM | POA: Diagnosis not present

## 2013-12-16 DIAGNOSIS — I4891 Unspecified atrial fibrillation: Secondary | ICD-10-CM | POA: Diagnosis not present

## 2014-01-20 ENCOUNTER — Other Ambulatory Visit: Payer: Self-pay | Admitting: *Deleted

## 2014-01-20 DIAGNOSIS — M8580 Other specified disorders of bone density and structure, unspecified site: Secondary | ICD-10-CM

## 2014-01-20 DIAGNOSIS — M810 Age-related osteoporosis without current pathological fracture: Secondary | ICD-10-CM | POA: Insufficient documentation

## 2014-01-27 DIAGNOSIS — I481 Persistent atrial fibrillation: Secondary | ICD-10-CM | POA: Diagnosis not present

## 2014-01-27 DIAGNOSIS — I10 Essential (primary) hypertension: Secondary | ICD-10-CM | POA: Diagnosis not present

## 2014-01-27 DIAGNOSIS — E785 Hyperlipidemia, unspecified: Secondary | ICD-10-CM | POA: Diagnosis not present

## 2014-01-27 DIAGNOSIS — E875 Hyperkalemia: Secondary | ICD-10-CM | POA: Diagnosis not present

## 2014-01-27 DIAGNOSIS — I4891 Unspecified atrial fibrillation: Secondary | ICD-10-CM | POA: Diagnosis not present

## 2014-01-27 DIAGNOSIS — M810 Age-related osteoporosis without current pathological fracture: Secondary | ICD-10-CM | POA: Diagnosis not present

## 2014-01-27 LAB — PROTIME-INR

## 2014-01-28 DIAGNOSIS — M810 Age-related osteoporosis without current pathological fracture: Secondary | ICD-10-CM | POA: Diagnosis not present

## 2014-01-28 DIAGNOSIS — Z1231 Encounter for screening mammogram for malignant neoplasm of breast: Secondary | ICD-10-CM | POA: Diagnosis not present

## 2014-02-02 ENCOUNTER — Encounter: Payer: Self-pay | Admitting: Gynecology

## 2014-02-02 DIAGNOSIS — M25511 Pain in right shoulder: Secondary | ICD-10-CM | POA: Diagnosis not present

## 2014-02-02 DIAGNOSIS — M75101 Unspecified rotator cuff tear or rupture of right shoulder, not specified as traumatic: Secondary | ICD-10-CM | POA: Diagnosis not present

## 2014-02-02 DIAGNOSIS — M170 Bilateral primary osteoarthritis of knee: Secondary | ICD-10-CM | POA: Diagnosis not present

## 2014-02-02 DIAGNOSIS — M7541 Impingement syndrome of right shoulder: Secondary | ICD-10-CM | POA: Insufficient documentation

## 2014-02-03 ENCOUNTER — Other Ambulatory Visit: Payer: Self-pay | Admitting: *Deleted

## 2014-02-03 DIAGNOSIS — M8580 Other specified disorders of bone density and structure, unspecified site: Secondary | ICD-10-CM

## 2014-02-23 DIAGNOSIS — I4891 Unspecified atrial fibrillation: Secondary | ICD-10-CM | POA: Diagnosis not present

## 2014-02-23 LAB — PROTIME-INR

## 2014-03-02 ENCOUNTER — Encounter: Payer: Self-pay | Admitting: Gynecology

## 2014-03-02 ENCOUNTER — Ambulatory Visit (INDEPENDENT_AMBULATORY_CARE_PROVIDER_SITE_OTHER): Payer: Medicare Other | Admitting: Gynecology

## 2014-03-02 VITALS — BP 120/76 | Ht 62.0 in | Wt 143.0 lb

## 2014-03-02 DIAGNOSIS — N952 Postmenopausal atrophic vaginitis: Secondary | ICD-10-CM | POA: Diagnosis not present

## 2014-03-02 DIAGNOSIS — M810 Age-related osteoporosis without current pathological fracture: Secondary | ICD-10-CM | POA: Diagnosis not present

## 2014-03-02 MED ORDER — DENOSUMAB 60 MG/ML ~~LOC~~ SOLN
60.0000 mg | Freq: Once | SUBCUTANEOUS | Status: AC
  Administered 2014-03-02: 60 mg via SUBCUTANEOUS

## 2014-03-02 NOTE — Patient Instructions (Signed)
Office will call you in follow up of your lab results and possible referral to endocrinology.

## 2014-03-02 NOTE — Addendum Note (Signed)
Addended by: Nelva Nay on: 03/02/2014 10:34 AM   Modules accepted: Orders

## 2014-03-02 NOTE — Progress Notes (Signed)
[name] 04-26-1926 [id number]        80 y.o.  G3P3003 for follow up exam. Several issues noted below.  Past medical history,surgical history, problem list, medications, allergies, family history and social history were all reviewed and documented as reviewed in the EPIC chart.  ROS:  12 system ROS performed with pertinent positives and negatives included in the history, assessment and plan.   Additional significant findings :  none   Exam: Kim Counsellor Vitals:   03/02/14 0924  BP: 120/76  Height: 5\' 2"  (1.575 m)  Weight: 143 lb (64.864 kg)   General appearance:  Normal affect, orientation and appearance. Skin: Grossly normal HEENT: Without gross lesions.  No cervical or supraclavicular adenopathy. Thyroid normal.  Lungs:  Clear without wheezing, rales or rhonchi Cardiac: RR, without RMG Abdominal:  Soft, nontender, without masses, guarding, rebound, organomegaly or hernia Breasts:  Examined lying and sitting without masses, retractions, discharge or axillary adenopathy. Pelvic:  Ext/BUS/vagina with generalized atrophic changes  Cervix with atrophic changes  Uterus anteverted, normal size, shape and contour, midline and mobile nontender   Adnexa  Without masses or tenderness    Anus and perineum  Normal   Rectovaginal  Normal sphincter tone without palpated masses or tenderness.    Assessment/Plan:  80 y.o. G64P3003 female for follow up exam.   1. Postmenopausal/atrophic genital changes.  Patient continues to have some menopausal symptoms. We again discussed the issues of HRT and risks. Given her other medical issues and anticoagulation we both agree to monitor at present.  She's had no vaginal bleeding and knows importance to report any vaginal bleeding. 2. Osteoporosis. Most recent DEXA 01/2014 T score -2.9. Is going to have her first Prolia injection today.  Patient does have a history of elevated PTH at 145 2013. Saw Dr. Dwyane Dee historically as recommended by  Dr.Gottsegen. She said he had recommended starting Prolia at that point but no further workup otherwise. Will recheck her PTH TSH and vitamin D level today. If still elevated then we'll recommend that she see endocrinology again and I will refer her to Dr. Cruzita Lederer. Patient will follow up in 6 months for her next Prolia injection.  Plan repeat DEXA at two-year interval area 3. Mammography 01/2014. Continue with annual mammography. SBE monthly reviewed. 4. Pap smear 2012. No Pap smear done today. No history of significant abnormal Pap smears. Reviewed current screening guidelines and we both agreed to stop screening. 5. Colonoscopy 2011. Repeat at their recommended interval. 6. Health maintenance. No routine blood work done as this is done at her primary physician's office. Follow up above blood work results and possible referral to endocrinology, otherwise 6 months for her next Prolia shot.     Anastasio Auerbach MD, 10:22 AM 03/02/2014

## 2014-03-03 ENCOUNTER — Telehealth: Payer: Self-pay | Admitting: *Deleted

## 2014-03-03 DIAGNOSIS — M170 Bilateral primary osteoarthritis of knee: Secondary | ICD-10-CM | POA: Insufficient documentation

## 2014-03-03 DIAGNOSIS — M1712 Unilateral primary osteoarthritis, left knee: Secondary | ICD-10-CM | POA: Diagnosis not present

## 2014-03-03 LAB — PTH, INTACT AND CALCIUM
Calcium: 9.8 mg/dL (ref 8.4–10.5)
PTH: 127 pg/mL — ABNORMAL HIGH (ref 14–64)

## 2014-03-03 LAB — VITAMIN D 25 HYDROXY (VIT D DEFICIENCY, FRACTURES): Vit D, 25-Hydroxy: 29 ng/mL — ABNORMAL LOW (ref 30–100)

## 2014-03-03 LAB — TSH: TSH: 2.283 u[IU]/mL (ref 0.350–4.500)

## 2014-03-03 NOTE — Telephone Encounter (Signed)
(  late documentation) Pt called to get Prolia benefits prior to having her injection which she requests to get at visit on 03/02/14 with Dr Phineas Real. She has Medicare that does cover it after the deductible met then has a [employer] plan that also covers it without prior authorization request. And her BCBS Supplement covers all expenses that Medicare doesn't cover. Per Renaye Rakers and Margarette Asal at [place]. Pt will receive injection on 03/02/14 KW CMA

## 2014-03-04 ENCOUNTER — Telehealth: Payer: Self-pay | Admitting: *Deleted

## 2014-03-04 DIAGNOSIS — R7989 Other specified abnormal findings of blood chemistry: Secondary | ICD-10-CM

## 2014-03-04 NOTE — Telephone Encounter (Signed)
-----   Message from Catherine Auerbach, MD sent at 03/04/2014  7:48 AM EST ----- Tell patient her PTH hormone is still elevated. Her vitamin D is mildly low. Recommend OTC vitamin D 2000 units daily and to stay on this. I would also recommend a follow up appointment to see the endocrinologist as we discussed. I would like her to have an appointment to see Dr. Cruzita Lederer.

## 2014-03-04 NOTE — Telephone Encounter (Signed)
Left message for pt to call.

## 2014-03-12 DIAGNOSIS — I482 Chronic atrial fibrillation: Secondary | ICD-10-CM | POA: Diagnosis not present

## 2014-03-12 DIAGNOSIS — I071 Rheumatic tricuspid insufficiency: Secondary | ICD-10-CM | POA: Diagnosis not present

## 2014-03-12 DIAGNOSIS — I480 Paroxysmal atrial fibrillation: Secondary | ICD-10-CM | POA: Diagnosis not present

## 2014-03-12 DIAGNOSIS — I10 Essential (primary) hypertension: Secondary | ICD-10-CM | POA: Diagnosis not present

## 2014-03-13 NOTE — Telephone Encounter (Signed)
Pt informed with the below note, referral placed for [place] office they will contact pt to schedule.

## 2014-03-30 DIAGNOSIS — M170 Bilateral primary osteoarthritis of knee: Secondary | ICD-10-CM | POA: Diagnosis not present

## 2014-04-06 NOTE — Telephone Encounter (Signed)
appointment 04/10/14 @ 1:15pm

## 2014-04-09 DIAGNOSIS — I4891 Unspecified atrial fibrillation: Secondary | ICD-10-CM | POA: Diagnosis not present

## 2014-04-10 ENCOUNTER — Encounter: Payer: Self-pay | Admitting: Internal Medicine

## 2014-04-10 ENCOUNTER — Ambulatory Visit (INDEPENDENT_AMBULATORY_CARE_PROVIDER_SITE_OTHER): Payer: Medicare Other | Admitting: Internal Medicine

## 2014-04-10 VITALS — BP 112/72 | HR 99 | Temp 97.6°F | Resp 12 | Ht 63.0 in | Wt 145.0 lb

## 2014-04-10 DIAGNOSIS — E213 Hyperparathyroidism, unspecified: Secondary | ICD-10-CM

## 2014-04-10 LAB — BASIC METABOLIC PANEL
BUN: 25 mg/dL — AB (ref 6–23)
CHLORIDE: 109 meq/L (ref 96–112)
CO2: 26 mEq/L (ref 19–32)
Calcium: 9.1 mg/dL (ref 8.4–10.5)
Creatinine, Ser: 1.1 mg/dL (ref 0.4–1.2)
GFR: 51.74 mL/min — ABNORMAL LOW (ref >60.00)
Glucose, Bld: 93 mg/dL (ref 70–99)
POTASSIUM: 5.1 meq/L (ref 3.5–5.1)
SODIUM: 141 meq/L (ref 135–145)

## 2014-04-10 LAB — PHOSPHORUS: Phosphorus: 3.1 mg/dL (ref 2.3–4.6)

## 2014-04-10 LAB — MAGNESIUM: Magnesium: 2.3 mg/dL (ref 1.5–2.5)

## 2014-04-10 NOTE — Patient Instructions (Addendum)
Please stop at the lab.  Please come back for a follow-up appointment in 6 months  I will let you know if we need the urine collection. Patient information (Up-to-Date): Collection of a 24-hour urine specimen  - You should collect every drop of urine during each 24-hour period. It does not matter how much or little urine is passed each time, as long as every drop is collected. - Begin the urine collection in the morning after you wake up, after you have emptied your bladder for the first time. - Urinate (empty the bladder) for the first time and flush it down the toilet. Note the exact time (eg, 6:15 AM). You will begin the urine collection at this time. - Collect every drop of urine during the day and night in an empty collection bottle. Store the bottle at room temperature or in the refrigerator. - If you need to have a bowel movement, any urine passed with the bowel movement should be collected. Try not to include feces with the urine collection. If feces does get mixed in, do not try to remove the feces from the urine collection bottle. - Finish by collecting the first urine passed the next morning, adding it to the collection bottle. This should be within ten minutes before or after the time of the first morning void on the first day (which was flushed). In this example, you would try to void between 6:05 and 6:25 on the second day.  Please only take 1 calcium tablet a day, and take this with a meal.  Continue Vitamin D3 2000 units daily. - If you need to urinate one hour before the final collection time, drink a full glass of water so that you can void again at the appropriate time. If you have to urinate 20 minutes before, try to hold the urine until the proper time. - Please note the exact time of the final collection, even if it is not the same time as when collection began on day 1. - The bottle(s) may be kept at room temperature for a day or two, but should be kept cool or refrigerated  for longer periods of time.

## 2014-04-10 NOTE — Progress Notes (Signed)
Patient ID: [name], female   DOB: 04-Feb-1927, 81 y.o.   MRN: [id number]   HPI  [name] is a 81 y.o.-year-old female, referred by Dr. Phineas Real, in consultation for normocalcemic hyperparathyroidism.  Pt was dx with hyperparathyroidism in 2013. Calcium levels always mid-normal range. I reviewed pt's pertinent labs: Lab Results  Component Value Date   PTH 127* 03/02/2014   PTH 145.2* 02/28/2012   PTH 138.0* 02/21/2012   CALCIUM 9.8 03/02/2014   CALCIUM 9.0 02/28/2012   CALCIUM 9.8 02/21/2012   CALCIUM 9.5 10/28/2009   CALCIUM 9.5 04/22/2007   Pt has a h/o OP. I reviewed pt's DEXA scans: Date L1-L3 T score FN T score 33% distal Radius  01/28/2014 -2.9 (-5.7%*) LFN: -2.6 RFN: -2.1 n/a  01/09/2012 -2.5 (-5.8%*) LFN: -2.3 RFN: -1.8 n/a  She was recently started on Prolia - got 1 infusion (Dr Phineas Real)  No fractures or falls.   No h/o kidney stones.  No h/o CKD. Mosr recent BUN/Cr: 12/16/2013: BUN/Cr 25/1.1 (GFR 47); Ca 9.9; K 5.3 Lab Results  Component Value Date   BUN 24* 10/28/2009   CREATININE 0.99 10/28/2009   Pt is not on HCTZ.  Has a h/o mild vitamin D deficiency. Last vit D level was: Component     Latest Ref Rng 02/21/2012 03/02/2014  Vit D, 25-Hydroxy     30 - 100 ng/mL 40 29 (L)   Pt is on calcium-D 1200-? mg at bedtime and vitamin D >> just started 2000 IU /day, prev. On 1000 IU/day; she also eats dairy and green, leafy, vegetables. She takes a Magnesium supplement.   Pt does not have a FH of hypercalcemia, pituitary tumors, thyroid cancer. Has FH of osteoporosis in father. Mother had kidney stones.  ROS: Constitutional: no weight gain/loss, no fatigue, no subjective hyperthermia/hypothermia Eyes: no blurry vision, no xerophthalmia ENT: no sore throat, no nodules palpated in throat, no dysphagia/odynophagia, no hoarseness, + decreased hearing, + poor sleep Cardiovascular: no CP/+ SOB/+ palpitations/no leg swelling Respiratory: no cough/+  SOB Gastrointestinal: no N/V/D/C Musculoskeletal: + both: muscle/joint aches Skin: no rashes, + hair loss Neurological: no tremors/numbness/tingling/dizziness Psychiatric: no depression/anxiety  Past Medical History  Diagnosis Date  . Menopausal symptoms   . Atrial fibrillation   . Polymyalgia   . Cancer     Basal cell  . Fibroid   . Osteoporosis 2015    T score -2.9   Past Surgical History  Procedure Laterality Date  . Breast surgery      Breast Bx  . Cardiac electrophysiology study and ablation    . Basal cell carcinoma excision    . Hysteroscopy      X 2  . Skin biopsy    . Cardiac catheterization     History   Social History  . Marital Status: Married    Spouse Name: N/A    Number of Children: 3   Occupational History  . Retired Therapist, sports   Social History Main Topics  . Smoking status: Never Smoker   . Smokeless tobacco: Not on file  . Alcohol Use: 2.0 oz/week    2-3 drink(s) per week  . Drug Use: No   Current Outpatient Prescriptions on File Prior to Visit  Medication Sig Dispense Refill  . BIOTIN PO Take by mouth.    . Calcium Carbonate-Vitamin D (CALCIUM + D PO) Take by mouth.      . Cholecalciferol (VITAMIN D PO) Take by mouth.      . denosumab (  PROLIA) 60 MG/ML SOLN injection Inject 60 mg into the skin every 6 (six) months. Administer in upper arm, thigh, or abdomen    . diltiazem (CARDIZEM) 120 MG tablet Take 120 mg by mouth daily.      Marland Kitchen Spironolactone (ALDACTONE PO) Take by mouth.      . Warfarin Sodium (COUMADIN PO) Take by mouth.       No current facility-administered medications on file prior to visit.   No Known Allergies Family History  Problem Relation Age of Onset  . Hypertension Mother   . Heart disease Mother   . Heart disease Father   . Osteoporosis Father    PE: BP 112/72 mmHg  Pulse 99  Temp(Src) 97.6 F (36.4 C) (Oral)  Resp 12  Ht 5\' 3"  (1.6 m)  Wt 145 lb (65.772 kg)  BMI 25.69 kg/m2  SpO2 96% Wt Readings from Last 3  Encounters:  04/10/14 145 lb (65.772 kg)  03/02/14 143 lb (64.864 kg)  02/24/13 155 lb (70.308 kg)   Constitutional: normal weight, in NAD. No kyphosis. Eyes: PERRLA, EOMI, no exophthalmos ENT: moist mucous membranes, no thyromegaly, no cervical lymphadenopathy Cardiovascular: RRR, No MRG Respiratory: CTA B Gastrointestinal: abdomen soft, NT, ND, BS+ Musculoskeletal: no deformities, strength intact in all 4 Skin: moist, warm, no rashes, + varicose veins bilat. Neurological: no tremor with outstretched hands, DTR normal in all 4  Assessment: 1. Normocalcemic hyperparathyroidism  Plan: Patient has had several instances of elevated PTH level, but with normal calcium levels. - she has mild vitamin D deficiency, however, not severe enough to explain the PTH elevation - No apparent complications from hypercalcemia: no h/o nephrolithiasis, no AP, no fractures, but does have osteoporosis. - I discussed with the patient about the physiology of calcium and parathyroid hormone, and possible side effects from increased PTH, including kidney stones, osteoporosis, abdominal pain, etc.  - We discussed that we need to check whether his hyperparathyroidism is primary (Familial hypercalcemic hypocalciuria or parathyroid adenoma) or secondary (to conditions like: vitamin D deficiency, calcium malabsorption, hypercalciuria, renal insufficiency, etc.). - per review of her chart, she does not have CKD, she only has mild vit D def >> since then she increased her vit D intake to double (2000 IU vit D3 daily), no h/o malabsorptive ds, or kidney stones. - If she has primary hyperparathyroidism, criteria for parathyroid surgery are:  Increased calcium by more than 1 mg/dL above the upper limit of normal  Kidney ds.  Osteoporosis (or Vb fx) - she has this Age <92 years old New (2013): High UCa >400 mg/d and increased stone risk by biochemical stone risk analysis Presence of nephrolithiasis or  nephrocalcinosis Pt's preference!  - I will also check: calcium level intact PTH ([facility] assay) Magnesium Phosphorus vitamin D- 25 HO and 1,25 HO  24h urinary calcium/creatinine ratio - if iPTH by the [facility] assay is high  - pt given instructions for urine collection and the jug - We discussed possible consequences of hyperparathyroidism: ~1/3 pts will develop complications over 15 years (OP, nephrolithiasis).  - However, the benefit of parathyroidectomy in a 81 y/o woman without elevated calcium are not clear. I d/w the pt to check the above labs and decide about the plan when these are back. If this is a case of primary hyperparathyroidism, we may do better to just follow her clinically and with labs. I agree with Prolia for her osteoporosis.  - I will see the patient back in 6 months, I will discuss  through my chart or by phone  Component     Latest Ref Rng 04/10/2014  Sodium     135 - 145 mEq/L 141  Potassium     3.5 - 5.1 mEq/L 5.1  Chloride     96 - 112 mEq/L 109  CO2     19 - 32 mEq/L 26  Glucose     70 - 99 mg/dL 93  BUN     6 - 23 mg/dL 25 (H)  Creatinine     0.4 - 1.2 mg/dL 1.1  Calcium     8.4 - 10.5 mg/dL 9.1  GFR     >60.00 mL/min 51.74 (L)  PTH     15 - 65 pg/mL 64  VITD     30.00 - 100.00 ng/mL 31.10  Magnesium     1.5 - 2.5 mg/dL 2.3  Phosphorus     2.3 - 4.6 mg/dL 3.1   PTH and calcium normal. Vit D low-normal. Mg and Phosphorus normal. Even though the possibility of a small parathyroid adenoma is not 100% excluded, I would not recommend Sx in this case. I will continue to follow her. Vit D 1,25 diHO-vitD and UCa still pending, but these will not change the plan.

## 2014-04-11 LAB — PARATHYROID HORMONE, INTACT (NO CA): PTH: 64 pg/mL (ref 15–65)

## 2014-04-13 ENCOUNTER — Encounter: Payer: Self-pay | Admitting: *Deleted

## 2014-04-13 LAB — VITAMIN D 1,25 DIHYDROXY
VITAMIN D3 1, 25 (OH): 66 pg/mL
Vitamin D 1, 25 (OH)2 Total: 66 pg/mL (ref 18–72)
Vitamin D2 1, 25 (OH)2: <8 pg/mL

## 2014-04-13 LAB — VITAMIN D 25 HYDROXY (VIT D DEFICIENCY, FRACTURES): VITD: 31.1 ng/mL (ref 30.00–100.00)

## 2014-04-27 ENCOUNTER — Encounter: Payer: Self-pay | Admitting: Internal Medicine

## 2014-05-20 DIAGNOSIS — I4891 Unspecified atrial fibrillation: Secondary | ICD-10-CM | POA: Diagnosis not present

## 2014-05-26 DIAGNOSIS — D2371 Other benign neoplasm of skin of right lower limb, including hip: Secondary | ICD-10-CM | POA: Diagnosis not present

## 2014-05-26 DIAGNOSIS — L570 Actinic keratosis: Secondary | ICD-10-CM | POA: Diagnosis not present

## 2014-05-26 DIAGNOSIS — X32XXXA Exposure to sunlight, initial encounter: Secondary | ICD-10-CM | POA: Diagnosis not present

## 2014-05-26 DIAGNOSIS — L820 Inflamed seborrheic keratosis: Secondary | ICD-10-CM | POA: Diagnosis not present

## 2014-05-26 DIAGNOSIS — L821 Other seborrheic keratosis: Secondary | ICD-10-CM | POA: Diagnosis not present

## 2014-05-26 DIAGNOSIS — D485 Neoplasm of uncertain behavior of skin: Secondary | ICD-10-CM | POA: Diagnosis not present

## 2014-06-18 DIAGNOSIS — M7541 Impingement syndrome of right shoulder: Secondary | ICD-10-CM | POA: Diagnosis not present

## 2014-06-18 DIAGNOSIS — M25511 Pain in right shoulder: Secondary | ICD-10-CM | POA: Diagnosis not present

## 2014-06-18 DIAGNOSIS — M7501 Adhesive capsulitis of right shoulder: Secondary | ICD-10-CM | POA: Diagnosis not present

## 2014-06-18 DIAGNOSIS — M75101 Unspecified rotator cuff tear or rupture of right shoulder, not specified as traumatic: Secondary | ICD-10-CM | POA: Diagnosis not present

## 2014-06-23 DIAGNOSIS — I4891 Unspecified atrial fibrillation: Secondary | ICD-10-CM | POA: Diagnosis not present

## 2014-07-29 DIAGNOSIS — M150 Primary generalized (osteo)arthritis: Secondary | ICD-10-CM | POA: Diagnosis not present

## 2014-07-29 DIAGNOSIS — I10 Essential (primary) hypertension: Secondary | ICD-10-CM | POA: Diagnosis not present

## 2014-07-29 DIAGNOSIS — E538 Deficiency of other specified B group vitamins: Secondary | ICD-10-CM | POA: Diagnosis not present

## 2014-07-29 DIAGNOSIS — M353 Polymyalgia rheumatica: Secondary | ICD-10-CM | POA: Diagnosis not present

## 2014-07-29 DIAGNOSIS — I4891 Unspecified atrial fibrillation: Secondary | ICD-10-CM | POA: Diagnosis not present

## 2014-07-29 DIAGNOSIS — M810 Age-related osteoporosis without current pathological fracture: Secondary | ICD-10-CM | POA: Diagnosis not present

## 2014-07-29 DIAGNOSIS — E785 Hyperlipidemia, unspecified: Secondary | ICD-10-CM | POA: Diagnosis not present

## 2014-07-29 DIAGNOSIS — I481 Persistent atrial fibrillation: Secondary | ICD-10-CM | POA: Diagnosis not present

## 2014-08-26 ENCOUNTER — Telehealth: Payer: Self-pay | Admitting: Gynecology

## 2014-08-26 NOTE — Telephone Encounter (Addendum)
Phone call to patient. Prolia due after 08/31/14. Calcium level 9.1  On 04/10/2014.  Explained that we must check insurance benefits confirmed insurance cards. Called information to Prolia for benefits.

## 2014-09-03 DIAGNOSIS — Z952 Presence of prosthetic heart valve: Secondary | ICD-10-CM | POA: Diagnosis not present

## 2014-09-03 NOTE — Telephone Encounter (Signed)
Phone call to patient. Insurance after [employer] [phone]) met (secondary insurance no deduc no co pay). Cost to pt $0. Asked pt to call in to make Prolia injection appointment. Due after 08/31/2014.

## 2014-09-08 ENCOUNTER — Encounter: Payer: Self-pay | Admitting: Gynecology

## 2014-09-08 NOTE — Telephone Encounter (Signed)
Patient returned phone call . Schedule Prolia injection for June  22 at 11 am. Per pt request. Next injection due after 03/26/15.

## 2014-09-23 ENCOUNTER — Ambulatory Visit (INDEPENDENT_AMBULATORY_CARE_PROVIDER_SITE_OTHER): Payer: Medicare Other | Admitting: *Deleted

## 2014-09-23 DIAGNOSIS — M810 Age-related osteoporosis without current pathological fracture: Secondary | ICD-10-CM | POA: Diagnosis not present

## 2014-09-23 MED ORDER — DENOSUMAB 60 MG/ML ~~LOC~~ SOLN
60.0000 mg | Freq: Once | SUBCUTANEOUS | Status: AC
  Administered 2014-09-23: 60 mg via SUBCUTANEOUS

## 2014-09-29 DIAGNOSIS — Z952 Presence of prosthetic heart valve: Secondary | ICD-10-CM | POA: Diagnosis not present

## 2014-10-16 DIAGNOSIS — I10 Essential (primary) hypertension: Secondary | ICD-10-CM | POA: Insufficient documentation

## 2014-10-20 ENCOUNTER — Ambulatory Visit: Payer: BLUE CROSS/BLUE SHIELD | Admitting: Internal Medicine

## 2014-10-20 DIAGNOSIS — I071 Rheumatic tricuspid insufficiency: Secondary | ICD-10-CM | POA: Diagnosis not present

## 2014-10-20 DIAGNOSIS — I10 Essential (primary) hypertension: Secondary | ICD-10-CM | POA: Diagnosis not present

## 2014-10-20 DIAGNOSIS — I482 Chronic atrial fibrillation: Secondary | ICD-10-CM | POA: Diagnosis not present

## 2014-10-20 DIAGNOSIS — I480 Paroxysmal atrial fibrillation: Secondary | ICD-10-CM | POA: Diagnosis not present

## 2014-11-09 DIAGNOSIS — Z952 Presence of prosthetic heart valve: Secondary | ICD-10-CM | POA: Diagnosis not present

## 2014-11-24 DIAGNOSIS — C44612 Basal cell carcinoma of skin of right upper limb, including shoulder: Secondary | ICD-10-CM | POA: Diagnosis not present

## 2014-11-24 DIAGNOSIS — X32XXXA Exposure to sunlight, initial encounter: Secondary | ICD-10-CM | POA: Diagnosis not present

## 2014-11-24 DIAGNOSIS — C44619 Basal cell carcinoma of skin of left upper limb, including shoulder: Secondary | ICD-10-CM | POA: Diagnosis not present

## 2014-11-24 DIAGNOSIS — D485 Neoplasm of uncertain behavior of skin: Secondary | ICD-10-CM | POA: Diagnosis not present

## 2014-11-24 DIAGNOSIS — L578 Other skin changes due to chronic exposure to nonionizing radiation: Secondary | ICD-10-CM | POA: Diagnosis not present

## 2014-11-24 DIAGNOSIS — L570 Actinic keratosis: Secondary | ICD-10-CM | POA: Diagnosis not present

## 2014-11-24 DIAGNOSIS — L821 Other seborrheic keratosis: Secondary | ICD-10-CM | POA: Diagnosis not present

## 2014-11-25 ENCOUNTER — Telehealth: Payer: Self-pay | Admitting: Gynecology

## 2014-11-25 NOTE — Telephone Encounter (Signed)
Phone call from pt requesting prolia injection in nov. Explained must be 6 months apart, she will come in 03/31/15 for complete exam and prolia injection. Calcium 9.1  04/10/2014

## 2014-12-24 DIAGNOSIS — D485 Neoplasm of uncertain behavior of skin: Secondary | ICD-10-CM | POA: Diagnosis not present

## 2014-12-24 DIAGNOSIS — L821 Other seborrheic keratosis: Secondary | ICD-10-CM | POA: Diagnosis not present

## 2014-12-24 DIAGNOSIS — L578 Other skin changes due to chronic exposure to nonionizing radiation: Secondary | ICD-10-CM | POA: Diagnosis not present

## 2014-12-24 DIAGNOSIS — L820 Inflamed seborrheic keratosis: Secondary | ICD-10-CM | POA: Diagnosis not present

## 2014-12-25 DIAGNOSIS — Z952 Presence of prosthetic heart valve: Secondary | ICD-10-CM | POA: Diagnosis not present

## 2015-01-05 DIAGNOSIS — C44619 Basal cell carcinoma of skin of left upper limb, including shoulder: Secondary | ICD-10-CM | POA: Diagnosis not present

## 2015-01-05 DIAGNOSIS — L905 Scar conditions and fibrosis of skin: Secondary | ICD-10-CM | POA: Diagnosis not present

## 2015-01-19 DIAGNOSIS — C44612 Basal cell carcinoma of skin of right upper limb, including shoulder: Secondary | ICD-10-CM | POA: Diagnosis not present

## 2015-02-02 DIAGNOSIS — Z952 Presence of prosthetic heart valve: Secondary | ICD-10-CM | POA: Diagnosis not present

## 2015-02-18 DIAGNOSIS — H2513 Age-related nuclear cataract, bilateral: Secondary | ICD-10-CM | POA: Diagnosis not present

## 2015-03-23 DIAGNOSIS — I480 Paroxysmal atrial fibrillation: Secondary | ICD-10-CM | POA: Diagnosis not present

## 2015-03-23 DIAGNOSIS — I071 Rheumatic tricuspid insufficiency: Secondary | ICD-10-CM | POA: Diagnosis not present

## 2015-03-23 DIAGNOSIS — I10 Essential (primary) hypertension: Secondary | ICD-10-CM | POA: Diagnosis not present

## 2015-03-23 DIAGNOSIS — E782 Mixed hyperlipidemia: Secondary | ICD-10-CM | POA: Diagnosis not present

## 2015-03-23 DIAGNOSIS — I482 Chronic atrial fibrillation: Secondary | ICD-10-CM | POA: Diagnosis not present

## 2015-03-23 NOTE — Telephone Encounter (Signed)
Prolia scheduled for 03/31/15. $0 cost to pt. Medicare deduc $166 (met), co insurance 20%  approx $200 . Secondary ins coverage of $200 , NO PA needed.    Complete exam with Dr Phineas Real on 12/28.  Next injection due after 09/30/15

## 2015-03-24 ENCOUNTER — Encounter: Payer: Self-pay | Admitting: Gynecology

## 2015-03-25 DIAGNOSIS — Z952 Presence of prosthetic heart valve: Secondary | ICD-10-CM | POA: Diagnosis not present

## 2015-03-29 DIAGNOSIS — Z23 Encounter for immunization: Secondary | ICD-10-CM | POA: Diagnosis not present

## 2015-03-31 ENCOUNTER — Encounter: Payer: Self-pay | Admitting: Gynecology

## 2015-03-31 ENCOUNTER — Ambulatory Visit (INDEPENDENT_AMBULATORY_CARE_PROVIDER_SITE_OTHER): Payer: Medicare Other | Admitting: Gynecology

## 2015-03-31 VITALS — BP 132/78 | Ht 61.75 in | Wt 146.0 lb

## 2015-03-31 DIAGNOSIS — L659 Nonscarring hair loss, unspecified: Secondary | ICD-10-CM | POA: Diagnosis not present

## 2015-03-31 DIAGNOSIS — Z01419 Encounter for gynecological examination (general) (routine) without abnormal findings: Secondary | ICD-10-CM

## 2015-03-31 DIAGNOSIS — D179 Benign lipomatous neoplasm, unspecified: Secondary | ICD-10-CM

## 2015-03-31 DIAGNOSIS — M810 Age-related osteoporosis without current pathological fracture: Secondary | ICD-10-CM

## 2015-03-31 DIAGNOSIS — N952 Postmenopausal atrophic vaginitis: Secondary | ICD-10-CM

## 2015-03-31 DIAGNOSIS — Z1231 Encounter for screening mammogram for malignant neoplasm of breast: Secondary | ICD-10-CM | POA: Diagnosis not present

## 2015-03-31 MED ORDER — DENOSUMAB 60 MG/ML ~~LOC~~ SOLN
60.0000 mg | Freq: Once | SUBCUTANEOUS | Status: AC
  Administered 2015-03-31: 60 mg via SUBCUTANEOUS

## 2015-03-31 NOTE — Addendum Note (Signed)
Addended by: Thurnell Garbe A on: 03/31/2015 11:39 AM   Modules accepted: Orders

## 2015-03-31 NOTE — Progress Notes (Signed)
[name] [birth year] [id number]        81 y.o.  G3P3003  for for breast and pelvic exam as well as her Prolia shot  Past medical history,surgical history, problem list, medications, allergies, family history and social history were all reviewed and documented as reviewed in the EPIC chart.  ROS:  Performed with pertinent positives and negatives included in the history, assessment and plan.   Additional significant findings :  none   Exam: Programmer, multimedia Vitals:   03/31/15 1052  BP: 132/78  Height: 5' 1.75" (1.568 m)  Weight: 146 lb (66.225 kg)   General appearance:  Normal affect, orientation and appearance. Skin: Grossly normal HEENT: Without gross lesions.  No cervical or supraclavicular adenopathy. Thyroid normal.  Lungs:  Clear without wheezing, rales or rhonchi Cardiac: RR, without RMG Abdominal:  Soft, nontender, without guarding, rebound, organomegaly or hernia. Small lipomatous feeling mass 2-3 cm left lower quadrant flank. Breasts:  Examined lying and sitting without masses, retractions, discharge or axillary adenopathy. Pelvic:  Ext/BUS/vagina with atrophic changes  Cervix with atrophic changes  Uterus axial to anteverted, normal size, shape and contour, midline and mobile nontender   Adnexa  Without masses or tenderness    Anus and perineum  Normal   Rectovaginal  Normal sphincter tone without palpated masses or tenderness.    Assessment/Plan:  81 y.o. G2P3003 female for breast and pelvic exam.   1. Postmenopausal/atrophic genital changes. Patient doing well overall without significant hot flushes, night sweats, vaginal dryness or any vaginal bleeding. Continue to monitor and report any vaginal bleeding. 2. Small lipomatous feeling mass subcutaneous area, left lower quadrant flankpatient reports present for years unchanged. She'll continue to monitor and report any changes It is asymptomatic to her. 3. Osteoporosis. DEXA 01/2014 T score -2.9. She is due  for her Prolia today. This completes her first year.  We'll continue every 6 months for a five-year course. Repeat DEXA next year at two-year interval. PTH TSH and vitamin D normal last year. 4. Alopecia. Patient notes over the past 6 months or so generalized loss of hair. Exam shows no frank alopecia or balding. We'll check baseline TSH. Otherwise recommended follow up with dermatology. 5. Mammography scheduled today. Continue with annual mammography. SBE monthly review. 6. Pap smear 2012. No Pap smear done today. No history of significant abnormal Pap smears. We both agree to stop screening based on age per current screening guidelines. 7. Colonoscopy 2011. Repeat at their recommended interval.  IC Lite stool kit given to the patient and she will mail this back and then called make sure he received it and it is negative. 8. Health maintenance. No routine blood work done this is done at her primary physician's office. Follow up 6 months for her next Prolia otherwise annually for her next exam.   Anastasio Auerbach MD, 11:21 AM 03/31/2015

## 2015-03-31 NOTE — Patient Instructions (Signed)

## 2015-04-02 ENCOUNTER — Encounter: Payer: Self-pay | Admitting: Gynecology

## 2015-05-20 DIAGNOSIS — Z952 Presence of prosthetic heart valve: Secondary | ICD-10-CM | POA: Diagnosis not present

## 2015-06-14 DIAGNOSIS — M7989 Other specified soft tissue disorders: Secondary | ICD-10-CM | POA: Diagnosis not present

## 2015-06-14 DIAGNOSIS — M79609 Pain in unspecified limb: Secondary | ICD-10-CM | POA: Diagnosis not present

## 2015-06-14 DIAGNOSIS — I8312 Varicose veins of left lower extremity with inflammation: Secondary | ICD-10-CM | POA: Diagnosis not present

## 2015-06-14 DIAGNOSIS — Z952 Presence of prosthetic heart valve: Secondary | ICD-10-CM | POA: Diagnosis not present

## 2015-06-14 DIAGNOSIS — I872 Venous insufficiency (chronic) (peripheral): Secondary | ICD-10-CM | POA: Diagnosis not present

## 2015-06-14 DIAGNOSIS — I499 Cardiac arrhythmia, unspecified: Secondary | ICD-10-CM | POA: Diagnosis not present

## 2015-06-30 DIAGNOSIS — I499 Cardiac arrhythmia, unspecified: Secondary | ICD-10-CM | POA: Diagnosis not present

## 2015-06-30 DIAGNOSIS — I8312 Varicose veins of left lower extremity with inflammation: Secondary | ICD-10-CM | POA: Diagnosis not present

## 2015-06-30 DIAGNOSIS — M7989 Other specified soft tissue disorders: Secondary | ICD-10-CM | POA: Diagnosis not present

## 2015-06-30 DIAGNOSIS — M79609 Pain in unspecified limb: Secondary | ICD-10-CM | POA: Diagnosis not present

## 2015-06-30 DIAGNOSIS — I872 Venous insufficiency (chronic) (peripheral): Secondary | ICD-10-CM | POA: Diagnosis not present

## 2015-07-13 DIAGNOSIS — Z952 Presence of prosthetic heart valve: Secondary | ICD-10-CM | POA: Diagnosis not present

## 2015-08-10 DIAGNOSIS — I482 Chronic atrial fibrillation: Secondary | ICD-10-CM | POA: Diagnosis not present

## 2015-09-08 DIAGNOSIS — I482 Chronic atrial fibrillation: Secondary | ICD-10-CM | POA: Diagnosis not present

## 2015-09-21 ENCOUNTER — Telehealth: Payer: Self-pay | Admitting: Gynecology

## 2015-09-21 DIAGNOSIS — H10501 Unspecified blepharoconjunctivitis, right eye: Secondary | ICD-10-CM | POA: Diagnosis not present

## 2015-09-21 NOTE — Telephone Encounter (Signed)
prolia injection due after 09/30/2015. Left VM to call with new insurance information to check benefits.

## 2015-09-27 NOTE — Telephone Encounter (Signed)
PC from pt no insurance change. She would like injection 09/30/15. I will contact Prolia due to no benefits information has been received.. I also do not have a current Calcium level from PCP

## 2015-09-28 NOTE — Telephone Encounter (Addendum)
PC to pt , left message. Insurance benefits deductible [id number]  , co insurance 20% $420.  Secondary coverage 100% non covered Medicare cost for Pt $0. Needs Calcium level drawn . Caled her PCP and she has not been seen since 2016. WIll wait for her phone call.Called PCP and they have no bloodwork for 2017 pt has not been seen. Pt called and wanted blood work at cardiologist office (both are [facility] ) but I explained I had call PCP. Order for Calcium level faxed to PCP. SHe will have blood work then come in on Thursday PM for Prolia injection at 2:30 after we receive Calcium level results.

## 2015-09-29 DIAGNOSIS — M810 Age-related osteoporosis without current pathological fracture: Secondary | ICD-10-CM | POA: Diagnosis not present

## 2015-09-30 ENCOUNTER — Ambulatory Visit (INDEPENDENT_AMBULATORY_CARE_PROVIDER_SITE_OTHER): Payer: Medicare Other | Admitting: Gynecology

## 2015-09-30 DIAGNOSIS — M810 Age-related osteoporosis without current pathological fracture: Secondary | ICD-10-CM

## 2015-09-30 MED ORDER — DENOSUMAB 60 MG/ML ~~LOC~~ SOLN
60.0000 mg | Freq: Once | SUBCUTANEOUS | Status: AC
  Administered 2015-09-30: 60 mg via SUBCUTANEOUS

## 2015-09-30 NOTE — Telephone Encounter (Signed)
prolia given next injection after 03/31/16

## 2015-09-30 NOTE — Telephone Encounter (Signed)
Received labs from her primary physician which shows normal calcium at 9.7. If she is due for her Prolia she can proceed with this. Her vitamin D level was also checked and is low. Recommend to the patient to take additional 2000 units OTC vitamin D daily. I called and informed Pt about her Vit D per the above note from Dr Phineas Real. She will come in today for Prolia injection.

## 2015-10-01 ENCOUNTER — Encounter: Payer: Self-pay | Admitting: Gynecology

## 2015-10-14 DIAGNOSIS — Z950 Presence of cardiac pacemaker: Secondary | ICD-10-CM | POA: Diagnosis not present

## 2015-10-14 DIAGNOSIS — M353 Polymyalgia rheumatica: Secondary | ICD-10-CM | POA: Diagnosis not present

## 2015-10-14 DIAGNOSIS — G8929 Other chronic pain: Secondary | ICD-10-CM | POA: Diagnosis not present

## 2015-10-14 DIAGNOSIS — I482 Chronic atrial fibrillation: Secondary | ICD-10-CM | POA: Diagnosis not present

## 2015-10-14 DIAGNOSIS — M25512 Pain in left shoulder: Secondary | ICD-10-CM | POA: Diagnosis not present

## 2015-10-14 DIAGNOSIS — M170 Bilateral primary osteoarthritis of knee: Secondary | ICD-10-CM | POA: Diagnosis not present

## 2015-10-14 DIAGNOSIS — I10 Essential (primary) hypertension: Secondary | ICD-10-CM | POA: Diagnosis not present

## 2015-10-14 DIAGNOSIS — E213 Hyperparathyroidism, unspecified: Secondary | ICD-10-CM | POA: Diagnosis not present

## 2015-10-14 DIAGNOSIS — E782 Mixed hyperlipidemia: Secondary | ICD-10-CM | POA: Diagnosis not present

## 2015-10-14 DIAGNOSIS — E538 Deficiency of other specified B group vitamins: Secondary | ICD-10-CM | POA: Diagnosis not present

## 2015-10-14 DIAGNOSIS — M25511 Pain in right shoulder: Secondary | ICD-10-CM | POA: Diagnosis not present

## 2015-10-18 DIAGNOSIS — I872 Venous insufficiency (chronic) (peripheral): Secondary | ICD-10-CM | POA: Diagnosis not present

## 2015-10-18 DIAGNOSIS — M7989 Other specified soft tissue disorders: Secondary | ICD-10-CM | POA: Diagnosis not present

## 2015-10-18 DIAGNOSIS — M79609 Pain in unspecified limb: Secondary | ICD-10-CM | POA: Diagnosis not present

## 2015-10-18 DIAGNOSIS — I8312 Varicose veins of left lower extremity with inflammation: Secondary | ICD-10-CM | POA: Diagnosis not present

## 2015-10-18 DIAGNOSIS — I499 Cardiac arrhythmia, unspecified: Secondary | ICD-10-CM | POA: Diagnosis not present

## 2015-10-20 DIAGNOSIS — G8929 Other chronic pain: Secondary | ICD-10-CM | POA: Insufficient documentation

## 2015-10-20 DIAGNOSIS — M7541 Impingement syndrome of right shoulder: Secondary | ICD-10-CM | POA: Insufficient documentation

## 2015-10-20 DIAGNOSIS — M25511 Pain in right shoulder: Secondary | ICD-10-CM

## 2015-10-20 DIAGNOSIS — M25512 Pain in left shoulder: Secondary | ICD-10-CM

## 2015-10-20 DIAGNOSIS — M7542 Impingement syndrome of left shoulder: Secondary | ICD-10-CM | POA: Insufficient documentation

## 2015-12-18 DIAGNOSIS — Z23 Encounter for immunization: Secondary | ICD-10-CM | POA: Diagnosis not present

## 2016-01-14 DIAGNOSIS — M7541 Impingement syndrome of right shoulder: Secondary | ICD-10-CM | POA: Insufficient documentation

## 2016-01-14 DIAGNOSIS — M7542 Impingement syndrome of left shoulder: Secondary | ICD-10-CM | POA: Diagnosis not present

## 2016-01-31 DIAGNOSIS — I482 Chronic atrial fibrillation: Secondary | ICD-10-CM | POA: Diagnosis not present

## 2016-02-23 ENCOUNTER — Telehealth: Payer: Self-pay | Admitting: Gynecology

## 2016-03-21 NOTE — Telephone Encounter (Signed)
Prolia due 04/01/16 which is on a Saturday. I left a Vm for pt , she will also need a complete exam with Dr Phineas Real last exam 03/31/2015 . I will re check her insurance benefits in January and schedule her complete exam and her Prolia on the same day . Her Calcium level PCP at [facility] 9.7 09/29/15 .

## 2016-03-23 NOTE — Telephone Encounter (Signed)
PC  from pt requesting complete exam on 12/29  No apointments available. LM on VM to call me in 2018 so that we can make appointment for Prolia and complete exam and also make sure no insurance change.

## 2016-03-31 ENCOUNTER — Ambulatory Visit: Payer: Medicare Other

## 2016-04-04 DIAGNOSIS — Z23 Encounter for immunization: Secondary | ICD-10-CM | POA: Diagnosis not present

## 2016-04-04 NOTE — Telephone Encounter (Signed)
Exam with T Fontaine on 04/19/16 for complete exam and Prolia injection. No insurance change , Prolia re check benefits.

## 2016-04-06 NOTE — Telephone Encounter (Addendum)
Insurance benefits deduc $183 (0 met), [employer] 20 % $420 , secondary coverage 100% coverage , Injection r/s due to snow, 05/03/16

## 2016-04-11 ENCOUNTER — Encounter: Payer: Self-pay | Admitting: Gynecology

## 2016-04-18 DIAGNOSIS — R002 Palpitations: Secondary | ICD-10-CM | POA: Diagnosis not present

## 2016-04-18 DIAGNOSIS — I482 Chronic atrial fibrillation: Secondary | ICD-10-CM | POA: Diagnosis not present

## 2016-04-18 DIAGNOSIS — I071 Rheumatic tricuspid insufficiency: Secondary | ICD-10-CM | POA: Diagnosis not present

## 2016-04-18 DIAGNOSIS — I10 Essential (primary) hypertension: Secondary | ICD-10-CM | POA: Diagnosis not present

## 2016-04-18 DIAGNOSIS — E782 Mixed hyperlipidemia: Secondary | ICD-10-CM | POA: Diagnosis not present

## 2016-04-19 ENCOUNTER — Encounter: Payer: Medicare Other | Admitting: Gynecology

## 2016-04-21 DIAGNOSIS — R002 Palpitations: Secondary | ICD-10-CM | POA: Insufficient documentation

## 2016-04-28 ENCOUNTER — Encounter: Payer: Medicare Other | Admitting: Gynecology

## 2016-05-02 ENCOUNTER — Telehealth: Payer: Self-pay | Admitting: *Deleted

## 2016-05-02 NOTE — Telephone Encounter (Signed)
I would wait on the injection until she has no symptoms. 1 risk of Prolia is increased risk of infection.

## 2016-05-02 NOTE — Telephone Encounter (Signed)
Pt has annual scheduled tomorrow, due for prolia injection tomorrow as well. Pt c/o coughing, nasal congestion, no fever, no chills. She asked if okay to still have injection? Please advise

## 2016-05-03 ENCOUNTER — Encounter: Payer: Medicare Other | Admitting: Gynecology

## 2016-05-03 ENCOUNTER — Encounter: Payer: Self-pay | Admitting: Gynecology

## 2016-05-03 DIAGNOSIS — Z1231 Encounter for screening mammogram for malignant neoplasm of breast: Secondary | ICD-10-CM | POA: Diagnosis not present

## 2016-05-03 DIAGNOSIS — M810 Age-related osteoporosis without current pathological fracture: Secondary | ICD-10-CM | POA: Diagnosis not present

## 2016-05-03 NOTE — Telephone Encounter (Signed)
Pt informed with the below note. 

## 2016-05-04 NOTE — Telephone Encounter (Signed)
Pt r/s sick appointment 06/2016

## 2016-05-05 DIAGNOSIS — M7541 Impingement syndrome of right shoulder: Secondary | ICD-10-CM | POA: Diagnosis not present

## 2016-05-05 DIAGNOSIS — M7542 Impingement syndrome of left shoulder: Secondary | ICD-10-CM | POA: Diagnosis not present

## 2016-05-08 ENCOUNTER — Encounter: Payer: Self-pay | Admitting: Gynecology

## 2016-05-09 ENCOUNTER — Encounter: Payer: Self-pay | Admitting: Gynecology

## 2016-06-07 ENCOUNTER — Encounter: Payer: Self-pay | Admitting: Gynecology

## 2016-06-07 ENCOUNTER — Ambulatory Visit (INDEPENDENT_AMBULATORY_CARE_PROVIDER_SITE_OTHER): Payer: Medicare Other | Admitting: Gynecology

## 2016-06-07 VITALS — BP 120/74 | Ht 62.0 in | Wt 140.0 lb

## 2016-06-07 DIAGNOSIS — Z01411 Encounter for gynecological examination (general) (routine) with abnormal findings: Secondary | ICD-10-CM

## 2016-06-07 DIAGNOSIS — K644 Residual hemorrhoidal skin tags: Secondary | ICD-10-CM

## 2016-06-07 DIAGNOSIS — K529 Noninfective gastroenteritis and colitis, unspecified: Secondary | ICD-10-CM

## 2016-06-07 DIAGNOSIS — N952 Postmenopausal atrophic vaginitis: Secondary | ICD-10-CM

## 2016-06-07 DIAGNOSIS — M810 Age-related osteoporosis without current pathological fracture: Secondary | ICD-10-CM

## 2016-06-07 MED ORDER — DENOSUMAB 60 MG/ML ~~LOC~~ SOLN
60.0000 mg | Freq: Once | SUBCUTANEOUS | Status: AC
  Administered 2016-06-07: 60 mg via SUBCUTANEOUS

## 2016-06-07 NOTE — Addendum Note (Signed)
Addended by: Nelva Nay on: 06/07/2016 02:13 PM   Modules accepted: Orders

## 2016-06-07 NOTE — Patient Instructions (Signed)
Follow up with your gastroenterology doctor.

## 2016-06-07 NOTE — Progress Notes (Signed)
    [name] 05/05/1926 [id number]        83 y.o.  G3P3003 for breast and pelvic exam.  Past medical history,surgical history, problem list, medications, allergies, family history and social history were all reviewed and documented as reviewed in the EPIC chart.  ROS:  Performed with pertinent positives and negatives included in the history, assessment and plan.   Additional significant findings :  None   Exam: [name] assistant Vitals:   06/07/16 1045  BP: 120/74  Weight: 140 lb (63.5 kg)  Height: 5\' 2"  (1.575 m)   Body mass index is 25.61 kg/m.  General appearance:  Normal affect, orientation and appearance. Skin: Grossly normal HEENT: Without gross lesions.  No cervical or supraclavicular adenopathy. Thyroid normal.  Lungs:  Clear without wheezing, rales or rhonchi Cardiac: RR, without RMG Abdominal:  Soft, nontender, without masses, guarding, rebound, organomegaly or hernia Breasts:  Examined lying and sitting without masses, retractions, discharge or axillary adenopathy. Pelvic:  Ext, BUS, Vagina: With atrophic changes  Cervix: With atrophic changes  Uterus: Anteverted, normal size, shape and contour, midline and mobile nontender   Adnexa: Without masses or tenderness    Anus and perineum: With inflamed external hemorrhoid  Rectovaginal: Normal sphincter tone without palpated masses or tenderness.    Assessment/Plan:  83 y.o. G70P3003 female for breast and pelvic exam.   1. Postmenopausal/atrophic genital changes. No significant hot flushes, night sweats, vaginal dryness or any bleeding. Continue to monitor report any issues or bleeding. 2. Osteoporosis. DEXA 2018 T score -2.8 stable/improved from prior DEXA. Going on 2 years of Prolia. Received injection today. Continue this coming year. 3. Mammography 04/2016. Continue with annual mammography when due. SBE monthly. 4. Colonoscopy 2011. Patient is complaining of frequent stooling since [place] of [place]. Not having significant diarrhea and nausea vomiting or abdominal pain. Patient is going to make an appointment to see her gastroenterologist. 5. External hemorrhoid. Has become inflamed since her frequent stooling. We'll follow up with gastroenterology as above. 6. Pap smear 2012. No Pap smear done today. No history of significant abnormal Pap smears. We both agree to stop screening per current screening guidelines based on age. 7. Health maintenance. No routine lab work done as patient does this elsewhere. Follow up with her gastroenterologist. Follow up for next Prolia shot in 6 months. Follow up for exam in one year.   Anastasio Auerbach MD, 11:11 AM 06/07/2016

## 2016-06-08 NOTE — Telephone Encounter (Signed)
Prolia given 06/07/16  Next due after 12/09/16 will also need calcium level.

## 2016-07-27 DIAGNOSIS — I482 Chronic atrial fibrillation: Secondary | ICD-10-CM | POA: Diagnosis not present

## 2016-07-27 DIAGNOSIS — M1712 Unilateral primary osteoarthritis, left knee: Secondary | ICD-10-CM | POA: Diagnosis not present

## 2016-07-27 DIAGNOSIS — M7542 Impingement syndrome of left shoulder: Secondary | ICD-10-CM | POA: Diagnosis not present

## 2016-08-02 DIAGNOSIS — M1711 Unilateral primary osteoarthritis, right knee: Secondary | ICD-10-CM | POA: Diagnosis not present

## 2016-08-02 DIAGNOSIS — M25511 Pain in right shoulder: Secondary | ICD-10-CM | POA: Diagnosis not present

## 2016-08-02 DIAGNOSIS — M7542 Impingement syndrome of left shoulder: Secondary | ICD-10-CM | POA: Diagnosis not present

## 2016-08-02 DIAGNOSIS — M25512 Pain in left shoulder: Secondary | ICD-10-CM | POA: Diagnosis not present

## 2016-08-02 DIAGNOSIS — G8929 Other chronic pain: Secondary | ICD-10-CM | POA: Diagnosis not present

## 2016-08-02 DIAGNOSIS — M810 Age-related osteoporosis without current pathological fracture: Secondary | ICD-10-CM | POA: Diagnosis not present

## 2016-08-02 DIAGNOSIS — M1712 Unilateral primary osteoarthritis, left knee: Secondary | ICD-10-CM | POA: Diagnosis not present

## 2016-08-02 DIAGNOSIS — M7541 Impingement syndrome of right shoulder: Secondary | ICD-10-CM | POA: Diagnosis not present

## 2016-09-21 DIAGNOSIS — C44612 Basal cell carcinoma of skin of right upper limb, including shoulder: Secondary | ICD-10-CM | POA: Diagnosis not present

## 2016-09-21 DIAGNOSIS — D0461 Carcinoma in situ of skin of right upper limb, including shoulder: Secondary | ICD-10-CM | POA: Diagnosis not present

## 2016-09-21 DIAGNOSIS — D485 Neoplasm of uncertain behavior of skin: Secondary | ICD-10-CM | POA: Diagnosis not present

## 2016-09-21 DIAGNOSIS — D0462 Carcinoma in situ of skin of left upper limb, including shoulder: Secondary | ICD-10-CM | POA: Diagnosis not present

## 2016-09-21 DIAGNOSIS — C44712 Basal cell carcinoma of skin of right lower limb, including hip: Secondary | ICD-10-CM | POA: Diagnosis not present

## 2016-09-21 DIAGNOSIS — Z85828 Personal history of other malignant neoplasm of skin: Secondary | ICD-10-CM | POA: Diagnosis not present

## 2016-09-21 DIAGNOSIS — Z08 Encounter for follow-up examination after completed treatment for malignant neoplasm: Secondary | ICD-10-CM | POA: Diagnosis not present

## 2016-10-12 DIAGNOSIS — I10 Essential (primary) hypertension: Secondary | ICD-10-CM | POA: Diagnosis not present

## 2016-10-12 DIAGNOSIS — I482 Chronic atrial fibrillation: Secondary | ICD-10-CM | POA: Diagnosis not present

## 2016-10-12 DIAGNOSIS — E782 Mixed hyperlipidemia: Secondary | ICD-10-CM | POA: Diagnosis not present

## 2016-10-12 DIAGNOSIS — R0602 Shortness of breath: Secondary | ICD-10-CM | POA: Insufficient documentation

## 2016-10-23 ENCOUNTER — Telehealth: Payer: Self-pay | Admitting: Gynecology

## 2016-10-23 ENCOUNTER — Ambulatory Visit (INDEPENDENT_AMBULATORY_CARE_PROVIDER_SITE_OTHER): Payer: Medicare Other | Admitting: Gastroenterology

## 2016-10-23 ENCOUNTER — Encounter: Payer: Self-pay | Admitting: Gastroenterology

## 2016-10-23 ENCOUNTER — Other Ambulatory Visit: Payer: Self-pay

## 2016-10-23 VITALS — BP 148/79 | HR 74 | Temp 97.9°F | Ht 63.0 in | Wt 134.0 lb

## 2016-10-23 DIAGNOSIS — R159 Full incontinence of feces: Secondary | ICD-10-CM | POA: Diagnosis not present

## 2016-10-23 DIAGNOSIS — E782 Mixed hyperlipidemia: Secondary | ICD-10-CM | POA: Insufficient documentation

## 2016-10-23 DIAGNOSIS — E538 Deficiency of other specified B group vitamins: Secondary | ICD-10-CM | POA: Insufficient documentation

## 2016-10-23 DIAGNOSIS — M1990 Unspecified osteoarthritis, unspecified site: Secondary | ICD-10-CM | POA: Insufficient documentation

## 2016-10-23 DIAGNOSIS — R152 Fecal urgency: Secondary | ICD-10-CM

## 2016-10-23 DIAGNOSIS — M353 Polymyalgia rheumatica: Secondary | ICD-10-CM | POA: Insufficient documentation

## 2016-10-23 NOTE — Progress Notes (Signed)
Catherine Bellows MD, MRCP(U.K) [address]  [place]  [place], [place] [address]  Main: [phone]  Fax: [phone]   Gastroenterology Consultation  Referring Provider:     Adin Hector, MD Primary Care Physician:  Adin Hector, MD Primary Gastroenterologist:  Dr. Jonathon Hodge  Reason for Consultation:     Referral says abdominal pain but patient says here for incontinence.         HPI:   [name] is a 83 y.o. y/o female referred for consultation & management  by Dr. Caryl Comes, Wendelyn Breslow III, MD.     She says that she is here today to see me because everytime she wishes to urinate, and passes urine, passes gas and some stool , formed , not hard. Defecation occurs without her knowledge , occurring for a month. She says that she has leg cramps and takes 1 tablespoon of mustard and at times takes 5 tablespoons . No issues passing urine. No urinary incontinence. She says she has been so irritated from her hemorrhoids that she notices some blood on her toilet paper. Last colonoscopy was 5 years back , was normal or she had 1-2 polyps at that time. . Was told " she had a kinked bowel ".   She denies any weight loss. She is on coumadin for A fibb.    Past Medical History:  Diagnosis Date  . Atrial fibrillation ([place])   . Cancer (HCC)    Basal cell  . Fibroid   . Menopausal symptoms   . Osteoporosis 2018   T score -2.8 stable/improved from prior DEXA  . Polymyalgia ([place])     Past Surgical History:  Procedure Laterality Date  . BASAL CELL CARCINOMA EXCISION    . BREAST SURGERY     Breast Bx  . CARDIAC CATHETERIZATION    . CARDIAC ELECTROPHYSIOLOGY STUDY AND ABLATION    . HYSTEROSCOPY     X 2  . SKIN BIOPSY      Prior to Admission medications   Medication Sig Start Date End Date Taking? Authorizing Provider  diltiazem (CARDIZEM CD) 120 MG 24 hr capsule  03/28/16  Yes [provider]  Influenza Vac Split High-Dose (FLUZONE HIGH-DOSE) 0.5 ML SUSY TO  BE ADMINISTERED BY PHARMACIST FOR IMMUNIZATION 12/18/15  Yes [provider]  BIOTIN PO Take by mouth.    [provider]  Calcium Carbonate-Vitamin D (CALCIUM + D PO) Take by mouth.      [provider]  celecoxib (CELEBREX) 200 MG capsule  07/27/16   [provider]  Cholecalciferol (VITAMIN D PO) Take 2 capsules by mouth daily.     [provider]  Cyanocobalamin (B-12 COMPLIANCE INJECTION) 1000 MCG/ML KIT Inject as directed.    [provider]  denosumab (PROLIA) 60 MG/ML SOLN injection Inject 60 mg into the skin every 6 (six) months. Administer in upper arm, thigh, or abdomen    [provider]  diltiazem (CARDIZEM) 120 MG tablet Take 120 mg by mouth daily.      [provider]  Spironolactone (ALDACTONE PO) Take by mouth.      [provider]  Warfarin Sodium (COUMADIN PO) Take by mouth.      [provider]    Family History  Problem Relation Age of Onset  . Hypertension Mother   . Heart disease Mother   . Heart disease Father   . Osteoporosis Father      Social History  Substance Use Topics  . Smoking status: Never Smoker  . Smokeless tobacco: Never Used  . Alcohol use 2.4 oz/week    4 Standard drinks or equivalent per week    Allergies as of 10/23/2016  . (No Known Allergies)    Review of Systems:    All systems reviewed and negative except where noted in HPI.   Physical Exam:  There were no vitals taken for this visit. No LMP recorded. Patient is postmenopausal. Psych:  Alert and cooperative. Normal mood and affect. General:   Alert,  Well-developed, well-nourished, pleasant and cooperative in NAD Head:  Normocephalic and atraumatic. Eyes:  Sclera clear, no icterus.   Conjunctiva pink. Ears:  Normal auditory acuity. Nose:  No deformity, discharge, or lesions. Mouth:  No deformity or lesions,oropharynx pink & moist. Neck:  Supple; no masses or thyromegaly. Lungs:   Respirations even and unlabored.  Clear throughout to auscultation.   No wheezes, crackles, or rhonchi. No acute distress. Heart:  Regular rate and rhythm; no murmurs, clicks, rubs, or gallops. Abdomen:  Normal bowel sounds.  No bruits.  Soft, non-tender and non-distended without masses, hepatosplenomegaly or hernias noted.  No guarding or rebound tenderness.    Neurologic:  Alert and oriented x3;  grossly normal neurologically. Lymph Nodes:  No significant cervical adenopathy. Psych:  Alert and cooperative. Normal mood and affect.  Imaging Studies: No results found.  Assessment and Plan:   [name] is a 83 y.o. y/o female has been referred for fecal incontinence , she has a bowel movement every time she passes urine. Stool is not watery , is formed , occasionally has blood on the toilet paper. Discussed options - based on her history will plan to rule out any lesions of the rectum /sigmoid colon and hence a sigmoidoscopy should suffice. Since no polypectomy is planned , will not need to go off coumadin- we will just be looking , will perform rectal exam prior to procedure.  Plan  1. Flexible sigmoidoscopy this Friday   I have discussed alternative options, risks & benefits,  which include, but are not limited to, bleeding, infection, perforation,respiratory complication & drug reaction.  The patient agrees with this plan & written consent will be obtained.     Follow up as needed   Dr Catherine Bellows MD,MRCP(U.K)

## 2016-10-23 NOTE — Telephone Encounter (Signed)
PC from pt wanting to come in for Prolia injection . Her injection is after 12/08/16  Left VM for pt date of next injection. Last calcium was in 10/2015 at [facility] she will need repeat.

## 2016-10-24 ENCOUNTER — Telehealth: Payer: Self-pay | Admitting: Gastroenterology

## 2016-10-24 NOTE — Telephone Encounter (Signed)
10/24/16 MCR/BCBS  NO prior auth required for Flex sig 62863 / R19.5 Vicente Males, [facility], 10/27/16)

## 2016-10-25 DIAGNOSIS — D0462 Carcinoma in situ of skin of left upper limb, including shoulder: Secondary | ICD-10-CM | POA: Diagnosis not present

## 2016-10-25 DIAGNOSIS — L905 Scar conditions and fibrosis of skin: Secondary | ICD-10-CM | POA: Diagnosis not present

## 2016-10-27 ENCOUNTER — Encounter: Payer: Self-pay | Admitting: *Deleted

## 2016-10-27 ENCOUNTER — Encounter
Admission: RE | Disposition: A | Discharge status: Home / Self Care | Payer: Self-pay | Source: Ambulatory Visit | Attending: Gastroenterology

## 2016-10-27 ENCOUNTER — Ambulatory Visit
Admission: RE | Admit: 2016-10-27 | Discharge: 2016-10-27 | Disposition: A | Discharge status: Home / Self Care | Payer: Medicare Other | Source: Ambulatory Visit | Attending: Gastroenterology | Admitting: Gastroenterology

## 2016-10-27 DIAGNOSIS — K641 Second degree hemorrhoids: Secondary | ICD-10-CM | POA: Diagnosis not present

## 2016-10-27 DIAGNOSIS — R159 Full incontinence of feces: Secondary | ICD-10-CM | POA: Diagnosis not present

## 2016-10-27 DIAGNOSIS — I509 Heart failure, unspecified: Secondary | ICD-10-CM | POA: Diagnosis not present

## 2016-10-27 DIAGNOSIS — Z85828 Personal history of other malignant neoplasm of skin: Secondary | ICD-10-CM | POA: Insufficient documentation

## 2016-10-27 DIAGNOSIS — M353 Polymyalgia rheumatica: Secondary | ICD-10-CM | POA: Diagnosis not present

## 2016-10-27 DIAGNOSIS — K5730 Diverticulosis of large intestine without perforation or abscess without bleeding: Secondary | ICD-10-CM | POA: Diagnosis not present

## 2016-10-27 DIAGNOSIS — I4891 Unspecified atrial fibrillation: Secondary | ICD-10-CM | POA: Insufficient documentation

## 2016-10-27 DIAGNOSIS — Z79899 Other long term (current) drug therapy: Secondary | ICD-10-CM | POA: Insufficient documentation

## 2016-10-27 DIAGNOSIS — I2510 Atherosclerotic heart disease of native coronary artery without angina pectoris: Secondary | ICD-10-CM | POA: Insufficient documentation

## 2016-10-27 DIAGNOSIS — M810 Age-related osteoporosis without current pathological fracture: Secondary | ICD-10-CM | POA: Diagnosis not present

## 2016-10-27 DIAGNOSIS — Z950 Presence of cardiac pacemaker: Secondary | ICD-10-CM | POA: Insufficient documentation

## 2016-10-27 DIAGNOSIS — Z7901 Long term (current) use of anticoagulants: Secondary | ICD-10-CM | POA: Diagnosis not present

## 2016-10-27 DIAGNOSIS — K645 Perianal venous thrombosis: Secondary | ICD-10-CM | POA: Diagnosis not present

## 2016-10-27 HISTORY — DX: Presence of cardiac pacemaker: Z95.0

## 2016-10-27 HISTORY — DX: Atherosclerotic heart disease of native coronary artery without angina pectoris: I25.10

## 2016-10-27 HISTORY — DX: Heart failure, unspecified: I50.9

## 2016-10-27 HISTORY — PX: FLEXIBLE SIGMOIDOSCOPY: SHX5431

## 2016-10-27 SURGERY — SIGMOIDOSCOPY, FLEXIBLE
Anesthesia: General

## 2016-10-27 MED ORDER — SODIUM CHLORIDE 0.9 % IV SOLN
INTRAVENOUS | Status: DC
  Administered 2016-10-27: 10:00:00 via INTRAVENOUS

## 2016-10-27 MED ORDER — LIDOCAINE HCL 2 % EX GEL
CUTANEOUS | Status: AC
  Filled 2016-10-27: qty 5

## 2016-10-27 NOTE — Op Note (Signed)
[facility] Gastroenterology Patient Name: [name] Procedure Date: 10/27/2016 10:52 AM MRN: [id number] Account #: [id number] Date of Birth: [birth year] Admit Type: Outpatient Age: 83 Room: [facility] ENDO ROOM 4 Gender: Female Note Status: Finalized Procedure:            Flexible Sigmoidoscopy Indications:          Incontinence of feces Providers:            Jonathon Bellows MD, MD Referring MD:         Ramonita Lab, MD (Referring MD) Medicines:            None Complications:        No immediate complications. Procedure:            Pre-Anesthesia Assessment:                       - Prior to the procedure, a History and Physical was                        performed, and patient medications, allergies and                        sensitivities were reviewed. The patient's tolerance of                        previous anesthesia was reviewed.                       - The risks and benefits of the procedure and the                        sedation options and risks were discussed with the                        patient. All questions were answered and informed                        consent was obtained.                       - ASA Grade Assessment: III - A patient with severe                        systemic disease.                       After obtaining informed consent, the scope was passed                        under direct vision. The Colonoscope was introduced                        through the anus and advanced to the the left                        transverse colon. The flexible sigmoidoscopy was                        accomplished with ease. The patient tolerated the  procedure well. The quality of the bowel preparation                        was good. Findings:      The perianal exam findings include thrombosed external hemorrhoids,       non-thrombosed internal hemorrhoids and internal hemorrhoids that       prolapse with straining, but  spontaneously regress to the resting       position (Grade II).      Many small-mouthed diverticula were found in the sigmoid colon.      Non-bleeding internal hemorrhoids were found during endoscopy. The       hemorrhoids were moderate, small and Grade II (internal hemorrhoids that       prolapse but reduce spontaneously).      The exam was otherwise without abnormality. Impression:           - Thrombosed external hemorrhoids, non-thrombosed                        internal hemorrhoids and internal hemorrhoids that                        prolapse with straining, but spontaneously regress to                        the resting position (Grade II) found on perianal exam.                       - Diverticulosis in the sigmoid colon.                       - Non-bleeding internal hemorrhoids.                       - The examination was otherwise normal.                       - No specimens collected. Recommendation:       - Discharge patient to home.                       - Continue present medications.                       - Informed to the extent of the sigmoidoscopy - think                        hr symptoms of bleeding are from hemorroids and                        incontince related to poor anal tone. If symptoms                        persist can consider a colonoscopy . She will contact                        my office if she needs further evaluation if her                        symptoms done resolve. She did have formed stool in her  transverse colon suggesting she has no diarrhea. Procedure Code(s):    --- Professional ---                       [phone], Sigmoidoscopy, flexible; diagnostic, including                        collection of specimen(s) by brushing or washing, when                        performed (separate procedure) Diagnosis Code(s):    --- Professional ---                       K64.1, Second degree hemorrhoids                       K64.5, Perianal  venous thrombosis                       R15.9, Full incontinence of feces                       K57.30, Diverticulosis of large intestine without                        perforation or abscess without bleeding CPT copyright 2016 American Medical Association. All rights reserved. The codes documented in this report are preliminary and upon coder review may  be revised to meet current compliance requirements. Jonathon Bellows, MD Jonathon Bellows MD, MD 10/27/2016 11:18:40 AM This report has been signed electronically. Number of Addenda: 0 Note Initiated On: 10/27/2016 10:52 AM      [facility]

## 2016-10-27 NOTE — H&P (Signed)
Jonathon Bellows MD [address] [place], [place] [address] Phone: [phone] Fax : [phone]  Primary Care Physician:  Adin Hector, MD Primary Gastroenterologist:  Dr. Jonathon Bellows   Pre-Procedure History & Physical: HPI:  [name] is a 83 y.o. female is here for an flexible sigmoidoscopy.   Past Medical History:  Diagnosis Date  . Atrial fibrillation ([place])   . Cancer (HCC)    Basal cell  . CHF (congestive heart failure) ([place])   . Coronary artery disease   . Fibroid   . Menopausal symptoms   . Osteoporosis 2018   T score -2.8 stable/improved from prior DEXA  . Polymyalgia ([place])   . Presence of permanent cardiac pacemaker     Past Surgical History:  Procedure Laterality Date  . BASAL CELL CARCINOMA EXCISION    . BREAST SURGERY     Breast Bx  . CARDIAC CATHETERIZATION    . CARDIAC ELECTROPHYSIOLOGY STUDY AND ABLATION    . HYSTEROSCOPY     X 2  . SKIN BIOPSY      Prior to Admission medications   Medication Sig Start Date End Date Taking? Authorizing Provider  BIOTIN PO Take by mouth.   Yes [provider]  Calcium Carbonate-Vitamin D (CALCIUM + D PO) Take by mouth.     Yes [provider]  Cholecalciferol (VITAMIN D PO) Take 2 capsules by mouth daily.    Yes [provider]  Cyanocobalamin (B-12 COMPLIANCE INJECTION) 1000 MCG/ML KIT Inject as directed.   Yes [provider]  diltiazem (CARDIZEM) 120 MG tablet Take 120 mg by mouth daily.     Yes [provider]  Spironolactone (ALDACTONE PO) Take by mouth.     Yes [provider]  Warfarin Sodium (COUMADIN PO) Take by mouth.     Yes [provider]  celecoxib (CELEBREX) 200 MG capsule  07/27/16   [provider]  denosumab (PROLIA) 60 MG/ML SOLN injection Inject 60 mg into the skin every 6 (six) months. Administer in upper arm, thigh, or abdomen    [provider]  diltiazem (CARDIZEM CD) 120 MG 24 hr capsule  03/28/16    [provider]  Influenza Vac Split High-Dose (FLUZONE HIGH-DOSE) 0.5 ML SUSY TO BE ADMINISTERED BY PHARMACIST FOR IMMUNIZATION 12/18/15   [provider]    Allergies as of 10/23/2016  . (No Known Allergies)    Family History  Problem Relation Age of Onset  . Hypertension Mother   . Heart disease Mother   . Heart disease Father   . Osteoporosis Father     Social History   Social History  . Marital status: Married    Spouse name: N/A  . Number of children: N/A  . Years of education: N/A   Occupational History  . Not on file.   Social History Main Topics  . Smoking status: Never Smoker  . Smokeless tobacco: Never Used  . Alcohol use 2.4 oz/week    4 Standard drinks or equivalent per week  . Drug use: No  . Sexual activity: No     Comment: 1st intercourse 73 yo-1 partner   Other Topics Concern  . Not on file   Social History Narrative  . No narrative on file    Review of Systems: See HPI, otherwise negative ROS  Physical Exam: SpO2 100%  General:   Alert,  pleasant and cooperative in NAD Head:  Normocephalic and atraumatic. Neck:  Supple; no masses  or thyromegaly. Lungs:  Clear throughout to auscultation.    Heart:  Regular rate and rhythm. Abdomen:  Soft, nontender and nondistended. Normal bowel sounds, without guarding, and without rebound.   Neurologic:  Alert and  oriented x4;  grossly normal neurologically.  Impression/Plan: [name] is here for an flexible sigmoidoscopy to be performed for fecal incontinence, rectal bleeding   Risks, benefits, limitations, and alternatives regarding  flexible sigmoidoscopy have been reviewed with the patient.  Questions have been answered.  All parties agreeable.   Jonathon Bellows, MD  10/27/2016, 10:24 AM

## 2016-10-30 ENCOUNTER — Encounter: Payer: Self-pay | Admitting: Gastroenterology

## 2016-11-09 DIAGNOSIS — E538 Deficiency of other specified B group vitamins: Secondary | ICD-10-CM | POA: Diagnosis not present

## 2016-11-09 DIAGNOSIS — E782 Mixed hyperlipidemia: Secondary | ICD-10-CM | POA: Diagnosis not present

## 2016-11-09 DIAGNOSIS — I482 Chronic atrial fibrillation: Secondary | ICD-10-CM | POA: Diagnosis not present

## 2016-11-09 DIAGNOSIS — M810 Age-related osteoporosis without current pathological fracture: Secondary | ICD-10-CM | POA: Diagnosis not present

## 2016-11-09 DIAGNOSIS — I10 Essential (primary) hypertension: Secondary | ICD-10-CM | POA: Diagnosis not present

## 2016-11-09 DIAGNOSIS — E213 Hyperparathyroidism, unspecified: Secondary | ICD-10-CM | POA: Diagnosis not present

## 2016-11-09 DIAGNOSIS — I071 Rheumatic tricuspid insufficiency: Secondary | ICD-10-CM | POA: Diagnosis not present

## 2016-11-09 DIAGNOSIS — M353 Polymyalgia rheumatica: Secondary | ICD-10-CM | POA: Diagnosis not present

## 2016-11-21 ENCOUNTER — Other Ambulatory Visit: Payer: Self-pay | Admitting: Unknown Physician Specialty

## 2016-11-21 DIAGNOSIS — G8929 Other chronic pain: Secondary | ICD-10-CM

## 2016-11-21 DIAGNOSIS — M25511 Pain in right shoulder: Principal | ICD-10-CM

## 2016-11-28 ENCOUNTER — Other Ambulatory Visit: Payer: Self-pay | Admitting: Unknown Physician Specialty

## 2016-11-28 DIAGNOSIS — G8929 Other chronic pain: Secondary | ICD-10-CM

## 2016-11-28 DIAGNOSIS — M25511 Pain in right shoulder: Secondary | ICD-10-CM | POA: Diagnosis not present

## 2016-11-28 DIAGNOSIS — M170 Bilateral primary osteoarthritis of knee: Secondary | ICD-10-CM

## 2016-11-28 DIAGNOSIS — M7541 Impingement syndrome of right shoulder: Secondary | ICD-10-CM

## 2016-11-28 DIAGNOSIS — M25512 Pain in left shoulder: Principal | ICD-10-CM

## 2016-11-30 ENCOUNTER — Telehealth: Payer: Self-pay | Admitting: Gynecology

## 2016-11-30 ENCOUNTER — Encounter: Payer: Self-pay | Admitting: Gynecology

## 2016-11-30 NOTE — Telephone Encounter (Addendum)
Prolia due after 12/09/2016  Calcium   Complete exam TF 06/07/2016. Pt had labs at [facility] in July 2018 called PCP they will fax. Calcium 11/09/16  9.6

## 2016-12-05 ENCOUNTER — Other Ambulatory Visit: Payer: BLUE CROSS/BLUE SHIELD

## 2016-12-05 ENCOUNTER — Ambulatory Visit: Payer: BLUE CROSS/BLUE SHIELD

## 2016-12-20 NOTE — Telephone Encounter (Signed)
PC to [place] left message with Husband to call me regarding Prolia.  Insurance  No deductbile (met), Co pay 20% approx $210, Secondary insurance 100% coverage. Cost pt $0.

## 2016-12-25 DIAGNOSIS — Z23 Encounter for immunization: Secondary | ICD-10-CM | POA: Diagnosis not present

## 2016-12-26 ENCOUNTER — Encounter: Payer: Self-pay | Admitting: Gynecology

## 2016-12-26 ENCOUNTER — Telehealth: Payer: Self-pay | Admitting: *Deleted

## 2016-12-26 NOTE — Telephone Encounter (Signed)
Patient recently received flu shot from PCP, due for prolia would like to have done this week as long as not interaction? Please advise

## 2016-12-26 NOTE — Telephone Encounter (Signed)
I would wait at least a week after the flu shot.

## 2016-12-26 NOTE — Telephone Encounter (Signed)
Pt informed

## 2016-12-26 NOTE — Telephone Encounter (Signed)
PC to pt r/s Prolia due to flu shot today, she will come in Nurse only on Oct 5 at 10 30 am.

## 2016-12-28 ENCOUNTER — Ambulatory Visit: Payer: Medicare Other

## 2017-01-05 ENCOUNTER — Ambulatory Visit (INDEPENDENT_AMBULATORY_CARE_PROVIDER_SITE_OTHER): Payer: Medicare Other | Admitting: Gynecology

## 2017-01-05 DIAGNOSIS — M810 Age-related osteoporosis without current pathological fracture: Secondary | ICD-10-CM

## 2017-01-05 MED ORDER — DENOSUMAB 60 MG/ML ~~LOC~~ SOLN
60.0000 mg | Freq: Once | SUBCUTANEOUS | Status: AC
  Administered 2017-01-05: 60 mg via SUBCUTANEOUS

## 2017-01-08 DIAGNOSIS — I482 Chronic atrial fibrillation: Secondary | ICD-10-CM | POA: Diagnosis not present

## 2017-01-08 NOTE — Telephone Encounter (Signed)
Prolia given 01/05/17  Next injection due after 07/07/17

## 2017-01-10 DIAGNOSIS — M170 Bilateral primary osteoarthritis of knee: Secondary | ICD-10-CM | POA: Diagnosis not present

## 2017-02-08 DIAGNOSIS — C44612 Basal cell carcinoma of skin of right upper limb, including shoulder: Secondary | ICD-10-CM | POA: Diagnosis not present

## 2017-02-08 DIAGNOSIS — D0461 Carcinoma in situ of skin of right upper limb, including shoulder: Secondary | ICD-10-CM | POA: Diagnosis not present

## 2017-02-19 DIAGNOSIS — I482 Chronic atrial fibrillation: Secondary | ICD-10-CM | POA: Diagnosis not present

## 2017-02-27 ENCOUNTER — Other Ambulatory Visit
Admission: RE | Admit: 2017-02-27 | Discharge: 2017-02-27 | Disposition: A | Discharge status: Home / Self Care | Payer: Medicare Other | Source: Ambulatory Visit | Attending: Rheumatology | Admitting: Rheumatology

## 2017-02-27 DIAGNOSIS — M150 Primary generalized (osteo)arthritis: Secondary | ICD-10-CM | POA: Diagnosis not present

## 2017-02-27 DIAGNOSIS — M25562 Pain in left knee: Secondary | ICD-10-CM | POA: Diagnosis not present

## 2017-02-27 DIAGNOSIS — M1990 Unspecified osteoarthritis, unspecified site: Secondary | ICD-10-CM | POA: Diagnosis not present

## 2017-02-27 LAB — SYNOVIAL CELL COUNT + DIFF, W/ CRYSTALS
Eosinophils-Synovial: 0 %
Lymphocytes-Synovial Fld: 6 %
MONOCYTE-MACROPHAGE-SYNOVIAL FLUID: 16 %
Neutrophil, Synovial: 78 %
Other Cells-SYN: 0
WBC, SYNOVIAL: 20535 /mm3 — AB (ref 0–200)

## 2017-03-23 DIAGNOSIS — I482 Chronic atrial fibrillation: Secondary | ICD-10-CM | POA: Diagnosis not present

## 2017-04-04 ENCOUNTER — Telehealth: Payer: Self-pay | Admitting: Gynecology

## 2017-04-04 NOTE — Telephone Encounter (Addendum)
PC from pt regarding next Prolia injection. Last injection 01/05/2017 next injection due 07/07/17  Complete exam due in march 2019  I called pt and left VM with above information. I also ask her to confirm no change with her insurance.

## 2017-05-02 NOTE — Telephone Encounter (Addendum)
Deductible MET  OOP MAX MET  Annual exam 07/10/17 at 11:30   Prolia same day  Calcium 9.6      Date 11/09/16  Upcoming dental procedures NO  Prior Authorization needed NO  Pt estimated Cost $0       Coverage Details: This is a medicare plan J Plan and it covers the medicare Part B deductible and 100% of the excess charges  Appointment 07/10/17 at 11:30

## 2017-05-02 NOTE — Telephone Encounter (Deleted)
Left message for pt to return my call.

## 2017-06-28 ENCOUNTER — Telehealth: Payer: Self-pay | Admitting: *Deleted

## 2017-06-28 NOTE — Telephone Encounter (Signed)
Left message for pt to return my call would like to reschedule Prolia from the same day as annual due to insurance not paying. Would like to come up with a better day for pt

## 2017-07-06 NOTE — Telephone Encounter (Signed)
Pt will discuss coming in for her Prolia after her annual visit

## 2017-07-10 ENCOUNTER — Encounter: Payer: Self-pay | Admitting: Gynecology

## 2017-07-10 ENCOUNTER — Ambulatory Visit (INDEPENDENT_AMBULATORY_CARE_PROVIDER_SITE_OTHER): Payer: Medicare Other | Admitting: Gynecology

## 2017-07-10 VITALS — BP 118/74 | Ht 62.0 in | Wt 131.0 lb

## 2017-07-10 DIAGNOSIS — M810 Age-related osteoporosis without current pathological fracture: Secondary | ICD-10-CM | POA: Diagnosis not present

## 2017-07-10 DIAGNOSIS — N952 Postmenopausal atrophic vaginitis: Secondary | ICD-10-CM

## 2017-07-10 DIAGNOSIS — Z01411 Encounter for gynecological examination (general) (routine) with abnormal findings: Secondary | ICD-10-CM

## 2017-07-10 MED ORDER — DENOSUMAB 60 MG/ML ~~LOC~~ SOLN
60.0000 mg | Freq: Once | SUBCUTANEOUS | Status: AC
  Administered 2017-07-10: 60 mg via SUBCUTANEOUS

## 2017-07-10 NOTE — Patient Instructions (Signed)
Schedule your mammogram  Follow-up in 6 months for your next Prolia shot

## 2017-07-10 NOTE — Progress Notes (Signed)
    [name] 1927/02/16 [id number]        84 y.o.  G3P3003 for breast and pelvic exam  Past medical history,surgical history, problem list, medications, allergies, family history and social history were all reviewed and documented as reviewed in the EPIC chart.  ROS:  Performed with pertinent positives and negatives included in the history, assessment and plan.   Additional significant findings : None   Exam: [name] assistant Vitals:   07/10/17 1105  BP: 118/74  Weight: 131 lb (59.4 kg)  Height: 5\' 2"  (1.575 m)   Body mass index is 23.96 kg/m.  General appearance:  Normal affect, orientation and appearance. Skin: Grossly normal HEENT: Without gross lesions.  No cervical or supraclavicular adenopathy. Thyroid normal.  Lungs:  Clear without wheezing, rales or rhonchi Cardiac: RR, without RMG Abdominal:  Soft, nontender, without masses, guarding, rebound, organomegaly or hernia Breasts:  Examined lying and sitting without masses, retractions, discharge or axillary adenopathy. Pelvic:  Ext, BUS, Vagina: With atrophic changes  Cervix: With atrophic changes  Uterus: Anteverted, normal size, shape and contour, midline and mobile nontender   Adnexa: Without masses or tenderness    Anus and perineum: Normal   Rectovaginal: Normal sphincter tone without palpated masses or tenderness.    Assessment/Plan:  84 y.o. G3P3003 female for breast and pelvic exam  1. Postmenopausal/atrophic genital changes.  Doing well without significant symptoms.  No bleeding. 2. Osteoporosis.  DEXA 2018 T score -2.8 stable/improved from prior DEXA.  3 years of Prolia.  Continue on Prolia for now.  Plan repeat DEXA next year. 3. Sigmoidoscopy 2018. 4. Mammography 04/2016.  Schedule follow-up mammogram now.  Breast exam normal today. 5. Pap smear 2012.  No Pap smear done today.  No history of abnormal Pap smears.  We both agree to stop screening per current screening guidelines based on  age. 6. Health maintenance.  No routine lab work done as patient does this elsewhere.  Follow-up in 1 year, sooner as needed.   Catherine Auerbach MD, 11:34 AM 07/10/2017

## 2017-07-10 NOTE — Addendum Note (Signed)
Addended by: Nelva Nay on: 07/10/2017 11:42 AM   Modules accepted: Orders

## 2017-07-12 ENCOUNTER — Encounter: Payer: Self-pay | Admitting: Gynecology

## 2017-07-12 NOTE — Telephone Encounter (Signed)
PROLIA GIVEN 07/10/17 NEXT INJECTION 01/10/18   Per Manager Prolia injection was ok to give during annual exam

## 2017-08-14 ENCOUNTER — Encounter: Payer: Self-pay | Admitting: Gynecology

## 2017-12-20 ENCOUNTER — Telehealth: Payer: Self-pay | Admitting: *Deleted

## 2017-12-20 NOTE — Telephone Encounter (Addendum)
Deductible $185 ($185mt)  OOP MAX Amount met  Annual exam 07/10/17 TF  Calcium 10.3            Date 01/09/18  [facility] # [phone]...Marland Kitchenhone # [phone]  Upcoming dental procedures   Prior Authorization needed NO  Pt estimated Cost $0   APPT 01/15/18 _0 :30  Coverage Details:: This is a Medicare Supplement Plan J and it covers the Medicare Part B deductible, co-insurance and 100% of the excess charges.

## 2018-01-07 ENCOUNTER — Ambulatory Visit
Admission: RE | Admit: 2018-01-07 | Discharge: 2018-01-07 | Disposition: A | Discharge status: Home / Self Care | Payer: Medicare Other | Source: Ambulatory Visit | Attending: Internal Medicine | Admitting: Internal Medicine

## 2018-01-07 ENCOUNTER — Other Ambulatory Visit: Payer: Self-pay | Admitting: Internal Medicine

## 2018-01-07 DIAGNOSIS — W19XXXA Unspecified fall, initial encounter: Secondary | ICD-10-CM | POA: Diagnosis not present

## 2018-01-07 DIAGNOSIS — R519 Headache, unspecified: Secondary | ICD-10-CM

## 2018-01-07 DIAGNOSIS — R51 Headache: Secondary | ICD-10-CM | POA: Insufficient documentation

## 2018-01-08 NOTE — Telephone Encounter (Signed)
Courtesy call to pt lab follow up.  Pt states she went to [employer] office and got her INR drawn no Calcium.   Pt requested me to  call them and verbally tell that office to call her to set up appt for her labs to be drawn at there office. Order was already faxed in. As well as scanned in to system   Pt states she has a black eye can does not want to make it to [facility] for labs at the moment   Spoke with [facility] to verify right fax and see if they received the information they stated to fax again . With Attention Sherrie Done. They states they will call pt and arrange lab visit

## 2018-01-10 ENCOUNTER — Encounter: Payer: Self-pay | Admitting: Gynecology

## 2018-01-14 ENCOUNTER — Ambulatory Visit (INDEPENDENT_AMBULATORY_CARE_PROVIDER_SITE_OTHER): Payer: Medicare Other | Admitting: Gynecology

## 2018-01-14 DIAGNOSIS — M810 Age-related osteoporosis without current pathological fracture: Secondary | ICD-10-CM | POA: Diagnosis not present

## 2018-01-14 MED ORDER — DENOSUMAB 60 MG/ML ~~LOC~~ SOSY
60.0000 mg | PREFILLED_SYRINGE | Freq: Once | SUBCUTANEOUS | Status: AC
  Administered 2018-01-14: 60 mg via SUBCUTANEOUS

## 2018-01-15 ENCOUNTER — Ambulatory Visit: Payer: Medicare Other

## 2018-01-17 NOTE — Telephone Encounter (Signed)
PROLIA GIVEN 01/14/18 NEXT INJECTION 07/17/2018  SOB SCANNED IN

## 2018-01-23 ENCOUNTER — Other Ambulatory Visit: Payer: Self-pay | Admitting: Student

## 2018-01-23 ENCOUNTER — Other Ambulatory Visit
Admission: RE | Admit: 2018-01-23 | Discharge: 2018-01-23 | Disposition: A | Discharge status: Home / Self Care | Payer: Medicare Other | Source: Ambulatory Visit | Attending: Student | Admitting: Student

## 2018-01-23 DIAGNOSIS — R197 Diarrhea, unspecified: Secondary | ICD-10-CM

## 2018-01-23 DIAGNOSIS — R1084 Generalized abdominal pain: Secondary | ICD-10-CM

## 2018-01-23 LAB — GASTROINTESTINAL PANEL BY PCR, STOOL (REPLACES STOOL CULTURE)
ASTROVIRUS: NOT DETECTED
Adenovirus F40/41: NOT DETECTED
Campylobacter species: NOT DETECTED
Cryptosporidium: NOT DETECTED
Cyclospora cayetanensis: NOT DETECTED
ENTEROAGGREGATIVE E COLI (EAEC): NOT DETECTED
ENTEROPATHOGENIC E COLI (EPEC): NOT DETECTED
ENTEROTOXIGENIC E COLI (ETEC): NOT DETECTED
Entamoeba histolytica: NOT DETECTED
GIARDIA LAMBLIA: NOT DETECTED
NOROVIRUS GI/GII: NOT DETECTED
Plesimonas shigelloides: NOT DETECTED
ROTAVIRUS A: NOT DETECTED
Salmonella species: NOT DETECTED
Sapovirus (I, II, IV, and V): NOT DETECTED
Shiga like toxin producing E coli (STEC): NOT DETECTED
Shigella/Enteroinvasive E coli (EIEC): NOT DETECTED
VIBRIO CHOLERAE: NOT DETECTED
Vibrio species: NOT DETECTED
Yersinia enterocolitica: NOT DETECTED

## 2018-01-23 LAB — C DIFFICILE QUICK SCREEN W PCR REFLEX
C DIFFICILE (CDIFF) TOXIN: NEGATIVE
C DIFFICLE (CDIFF) ANTIGEN: NEGATIVE
C Diff interpretation: NOT DETECTED

## 2018-01-24 LAB — CALPROTECTIN, FECAL: Calprotectin, Fecal: 122 ug/g — ABNORMAL HIGH (ref 0–120)

## 2018-01-26 LAB — PANCREATIC ELASTASE, FECAL: Pancreatic Elastase-1, Stool: 228 ug Elast./g (ref >200)

## 2018-01-30 ENCOUNTER — Ambulatory Visit
Admission: RE | Admit: 2018-01-30 | Discharge: 2018-01-30 | Disposition: A | Discharge status: Home / Self Care | Payer: Medicare Other | Source: Ambulatory Visit | Attending: Student | Admitting: Student

## 2018-01-30 DIAGNOSIS — N289 Disorder of kidney and ureter, unspecified: Secondary | ICD-10-CM | POA: Insufficient documentation

## 2018-01-30 DIAGNOSIS — R1084 Generalized abdominal pain: Secondary | ICD-10-CM | POA: Diagnosis present

## 2018-01-30 DIAGNOSIS — R197 Diarrhea, unspecified: Secondary | ICD-10-CM | POA: Diagnosis not present

## 2018-01-30 MED ORDER — IOHEXOL 300 MG/ML  SOLN
75.0000 mL | Freq: Once | INTRAMUSCULAR | Status: AC | PRN
  Administered 2018-01-30: 75 mL via INTRAVENOUS

## 2018-04-16 ENCOUNTER — Encounter: Payer: Self-pay | Admitting: Gynecology

## 2018-04-17 ENCOUNTER — Other Ambulatory Visit: Payer: BLUE CROSS/BLUE SHIELD

## 2018-04-29 ENCOUNTER — Other Ambulatory Visit: Payer: Self-pay

## 2018-04-29 ENCOUNTER — Ambulatory Visit
Admission: RE | Admit: 2018-04-29 | Discharge: 2018-04-29 | Disposition: A | Discharge status: Home / Self Care | Payer: Medicare Other | Source: Ambulatory Visit | Attending: Cardiology | Admitting: Cardiology

## 2018-04-29 ENCOUNTER — Encounter
Admission: RE | Admit: 2018-04-29 | Discharge: 2018-04-29 | Disposition: A | Discharge status: Home / Self Care | Payer: Medicare Other | Source: Ambulatory Visit | Attending: Cardiology | Admitting: Cardiology

## 2018-04-29 DIAGNOSIS — Z01810 Encounter for preprocedural cardiovascular examination: Secondary | ICD-10-CM | POA: Diagnosis present

## 2018-04-29 HISTORY — DX: Dyspnea, unspecified: R06.00

## 2018-04-29 HISTORY — DX: Cardiac arrhythmia, unspecified: I49.9

## 2018-04-29 HISTORY — DX: Unspecified osteoarthritis, unspecified site: M19.90

## 2018-04-29 LAB — BASIC METABOLIC PANEL
Anion gap: 6 (ref 5–15)
BUN: 19 mg/dL (ref 8–23)
CO2: 26 mmol/L (ref 22–32)
Calcium: 9.9 mg/dL (ref 8.9–10.3)
Chloride: 108 mmol/L (ref 98–111)
Creatinine, Ser: 0.76 mg/dL (ref 0.44–1.00)
GFR calc Af Amer: >60 mL/min (ref >60)
GFR calc non Af Amer: >60 mL/min (ref >60)
Glucose, Bld: 90 mg/dL (ref 70–99)
Potassium: 4.7 mmol/L (ref 3.5–5.1)
Sodium: 140 mmol/L (ref 135–145)

## 2018-04-29 LAB — APTT: aPTT: 35 seconds (ref 24–36)

## 2018-04-29 LAB — PROTIME-INR
INR: 1.54
Prothrombin Time: 18.3 seconds — ABNORMAL HIGH (ref 11.4–15.2)

## 2018-04-29 LAB — CBC
HCT: 42.7 % (ref 36.0–46.0)
Hemoglobin: 13.7 g/dL (ref 12.0–15.0)
MCH: 33.5 pg (ref 26.0–34.0)
MCHC: 32.1 g/dL (ref 30.0–36.0)
MCV: 104.4 fL — ABNORMAL HIGH (ref 80.0–100.0)
NRBC: 0 % (ref 0.0–0.2)
Platelets: 191 10*3/uL (ref 150–400)
RBC: 4.09 MIL/uL (ref 3.87–5.11)
RDW: 13.3 % (ref 11.5–15.5)
WBC: 6.3 10*3/uL (ref 4.0–10.5)

## 2018-04-29 NOTE — Patient Instructions (Signed)
Your procedure is scheduled on: Wednesday, May 01, 2018  Report to [place]   DO NOT STOP ON THE FIRST FLOOR TO REGISTER  To find out your arrival time please call [phone] between 1PM - 3PM on Tuesday, April 30, 2018  Remember: Instructions that are not followed completely may result in serious medical risk,  up to and including death, or upon the discretion of your surgeon and anesthesiologist your  surgery may need to be rescheduled.     _X__ 1. Do not eat food after midnight the night before your procedure.                 No gum chewing or hard candies.                       ABSOLUTELY NOTHING SOLID IN YOUR MOUTH AFTER M IDNIGHT                  You may drink clear liquids up to 2 hours before you are scheduled to arrive for your surgery-                   DO not drink clear liquids within 2 hours of the start of your surgery.                  Clear Liquids include:  water, apple juice without pulp, clear carbohydrate                 drink such as Clearfast of Gatorade, Black Coffee or Tea (Do not add                 anything to coffee or tea).  __X__2.  On the morning of surgery brush your teeth with toothpaste and water,                  You may rinse your mouth with mouthwash if you wish.                    Do not swallow any toothpaste of mouthwash.     _X__ 3.  No Alcohol for 24 hours before or after surgery.   _X__ 4.  Do Not Smoke or use e-cigarettes For 24 Hours Prior to Your Surgery.                 Do not use any chewable tobacco products for at least 6 hours prior to                 surgery.  ____  5.  Bring all medications with you on the day of surgery if instructed.   ____  6.  Notify your doctor if there is any change in your medical condition      (cold, fever, infections).     Do not wear jewelry, make-up, hairpins, clips or nail polish. Do not wear lotions, powders, or perfumes. You may NOT  wear deodorant. Do not shave 48 hours prior to surgery. Men may shave face and neck. Do not bring valuables to the hospital.    [facility] is not responsible for any belongings or valuables.  Contacts, dentures or bridgework may not be worn into surgery. Leave your suitcase in the car. After surgery it may be brought to your room. For patients admitted to the hospital, discharge time is determined by your treatment team.   Patients discharged the  day of surgery will not be allowed to drive home.   Please read over the following fact sheets that you were given:   PREPARING FOR SURGERY  ____ Take these medicines the morning of surgery with A SIP OF WATER:    1.CARDIZEM  2.   3.   4.  5.  6.  ____ Fleet Enema (as directed)   _X___ Use CHG Soap as directed  __X__ Stop Coumadin AS YOU ALREADY HAVE  __X__ Stop Anti-inflammatories AS OF TODAY              DO NOT TAKE ANY CELEBREX PRIOR TO SURGERY   ____ Stop supplements until after surgery.    ____ Bring C-Pap to the hospital.   DO NOT TAKE ALDACTONE ON THE DAY OF SURGERY  WEAR SOMETHING LOOSE AND COMFORTABLE TO [place]

## 2018-04-30 MED ORDER — CEFAZOLIN SODIUM-DEXTROSE 1-4 GM/50ML-% IV SOLN
1.0000 g | Freq: Once | INTRAVENOUS | Status: AC
  Administered 2018-05-01: 1 g via INTRAVENOUS

## 2018-05-01 ENCOUNTER — Encounter: Payer: Self-pay | Admitting: *Deleted

## 2018-05-01 ENCOUNTER — Ambulatory Visit
Admission: RE | Admit: 2018-05-01 | Discharge: 2018-05-01 | Disposition: A | Discharge status: Home / Self Care | Payer: Medicare Other | Attending: Cardiology | Admitting: Cardiology

## 2018-05-01 ENCOUNTER — Inpatient Hospital Stay: Payer: Medicare Other | Admitting: Certified Registered"

## 2018-05-01 ENCOUNTER — Encounter
Admission: RE | Disposition: A | Discharge status: Home / Self Care | Payer: Self-pay | Source: Home / Self Care | Attending: Cardiology

## 2018-05-01 ENCOUNTER — Other Ambulatory Visit: Payer: Self-pay

## 2018-05-01 DIAGNOSIS — E538 Deficiency of other specified B group vitamins: Secondary | ICD-10-CM | POA: Diagnosis not present

## 2018-05-01 DIAGNOSIS — Z8249 Family history of ischemic heart disease and other diseases of the circulatory system: Secondary | ICD-10-CM | POA: Diagnosis not present

## 2018-05-01 DIAGNOSIS — R0602 Shortness of breath: Secondary | ICD-10-CM | POA: Diagnosis not present

## 2018-05-01 DIAGNOSIS — Z7901 Long term (current) use of anticoagulants: Secondary | ICD-10-CM | POA: Diagnosis not present

## 2018-05-01 DIAGNOSIS — Z45010 Encounter for checking and testing of cardiac pacemaker pulse generator [battery]: Secondary | ICD-10-CM | POA: Diagnosis present

## 2018-05-01 DIAGNOSIS — I495 Sick sinus syndrome: Secondary | ICD-10-CM | POA: Insufficient documentation

## 2018-05-01 DIAGNOSIS — R42 Dizziness and giddiness: Secondary | ICD-10-CM | POA: Diagnosis not present

## 2018-05-01 DIAGNOSIS — Z79899 Other long term (current) drug therapy: Secondary | ICD-10-CM | POA: Insufficient documentation

## 2018-05-01 DIAGNOSIS — M810 Age-related osteoporosis without current pathological fracture: Secondary | ICD-10-CM | POA: Diagnosis not present

## 2018-05-01 DIAGNOSIS — I10 Essential (primary) hypertension: Secondary | ICD-10-CM | POA: Insufficient documentation

## 2018-05-01 DIAGNOSIS — I4819 Other persistent atrial fibrillation: Secondary | ICD-10-CM | POA: Diagnosis not present

## 2018-05-01 HISTORY — PX: IMPLANTABLE CARDIOVERTER DEFIBRILLATOR (ICD) GENERATOR CHANGE: SHX5469

## 2018-05-01 LAB — PROTIME-INR
INR: 1.11
Prothrombin Time: 14.2 seconds (ref 11.4–15.2)

## 2018-05-01 SURGERY — ICD GENERATOR CHANGE
Anesthesia: General

## 2018-05-01 MED ORDER — MIDAZOLAM HCL 2 MG/2ML IJ SOLN
INTRAMUSCULAR | Status: DC | PRN
  Administered 2018-05-01: 0.5 mg via INTRAVENOUS

## 2018-05-01 MED ORDER — LACTATED RINGERS IV SOLN
INTRAVENOUS | Status: DC
  Administered 2018-05-01: 12:00:00 via INTRAVENOUS

## 2018-05-01 MED ORDER — CEPHALEXIN 250 MG PO CAPS: 250.0000 mg | ORAL_CAPSULE | Freq: Two times a day (BID) | ORAL | 0 refills | Status: DC

## 2018-05-01 MED ORDER — FAMOTIDINE 20 MG PO TABS
ORAL_TABLET | ORAL | Status: AC
  Filled 2018-05-01: qty 1

## 2018-05-01 MED ORDER — CEFAZOLIN SODIUM-DEXTROSE 1-4 GM/50ML-% IV SOLN
INTRAVENOUS | Status: AC
  Filled 2018-05-01: qty 50

## 2018-05-01 MED ORDER — LIDOCAINE 1 % OPTIME INJ - NO CHARGE
INTRAMUSCULAR | Status: DC | PRN
  Administered 2018-05-01: 20 mL

## 2018-05-01 MED ORDER — FENTANYL CITRATE (PF) 100 MCG/2ML IJ SOLN: 25.0000 ug | INTRAMUSCULAR | Status: DC | PRN

## 2018-05-01 MED ORDER — PROPOFOL 10 MG/ML IV BOLUS
INTRAVENOUS | Status: AC
  Filled 2018-05-01: qty 20

## 2018-05-01 MED ORDER — ONDANSETRON HCL 4 MG/2ML IJ SOLN
INTRAMUSCULAR | Status: DC | PRN
  Administered 2018-05-01: 4 mg via INTRAVENOUS

## 2018-05-01 MED ORDER — FAMOTIDINE 20 MG PO TABS
20.0000 mg | ORAL_TABLET | Freq: Once | ORAL | Status: AC
  Administered 2018-05-01: 20 mg via ORAL

## 2018-05-01 MED ORDER — PROPOFOL 500 MG/50ML IV EMUL
INTRAVENOUS | Status: DC | PRN
  Administered 2018-05-01: 100 ug/kg/min via INTRAVENOUS

## 2018-05-01 MED ORDER — CEFAZOLIN SODIUM-DEXTROSE 2-4 GM/100ML-% IV SOLN
INTRAVENOUS | Status: AC
  Filled 2018-05-01: qty 100

## 2018-05-01 MED ORDER — SODIUM CHLORIDE 0.9 % IV SOLN
Freq: Once | INTRAVENOUS | Status: DC
  Filled 2018-05-01: qty 2

## 2018-05-01 MED ORDER — FENTANYL CITRATE (PF) 100 MCG/2ML IJ SOLN
INTRAMUSCULAR | Status: DC | PRN
  Administered 2018-05-01: 25 ug via INTRAVENOUS

## 2018-05-01 MED ORDER — ONDANSETRON HCL 4 MG/2ML IJ SOLN
INTRAMUSCULAR | Status: AC
  Filled 2018-05-01: qty 2

## 2018-05-01 MED ORDER — HEPARIN SODIUM (PORCINE) 5000 UNIT/ML IJ SOLN
INTRAMUSCULAR | Status: AC
  Filled 2018-05-01: qty 1

## 2018-05-01 MED ORDER — PROPOFOL 10 MG/ML IV BOLUS
INTRAVENOUS | Status: DC | PRN
  Administered 2018-05-01: 20 mg via INTRAVENOUS

## 2018-05-01 MED ORDER — ONDANSETRON HCL 4 MG/2ML IJ SOLN: 4.0000 mg | Freq: Once | INTRAMUSCULAR | Status: DC | PRN

## 2018-05-01 MED ORDER — MIDAZOLAM HCL 2 MG/2ML IJ SOLN
INTRAMUSCULAR | Status: AC
  Filled 2018-05-01: qty 2

## 2018-05-01 MED ORDER — FENTANYL CITRATE (PF) 100 MCG/2ML IJ SOLN
INTRAMUSCULAR | Status: AC
  Filled 2018-05-01: qty 2

## 2018-05-01 MED ORDER — PHENYLEPHRINE HCL 10 MG/ML IJ SOLN
INTRAMUSCULAR | Status: DC | PRN
  Administered 2018-05-01: 100 ug via INTRAVENOUS
  Administered 2018-05-01 (×2): 50 ug via INTRAVENOUS

## 2018-05-01 MED ORDER — LIDOCAINE HCL (PF) 2 % IJ SOLN
INTRAMUSCULAR | Status: AC
  Filled 2018-05-01: qty 10

## 2018-05-01 SURGICAL SUPPLY — 50 items
BAG DECANTER FOR FLEXI CONT (MISCELLANEOUS) ×3 IMPLANT
BLADE PHOTON ILLUMINATED (MISCELLANEOUS) ×3 IMPLANT
BLADE SURG SZ10 CARB STEEL (BLADE) ×3 IMPLANT
CANISTER SUCT 1200ML W/VALVE (MISCELLANEOUS) ×3 IMPLANT
CHLORAPREP W/TINT 26ML (MISCELLANEOUS) ×3 IMPLANT
CLOSURE WOUND 1/2 X4 (GAUZE/BANDAGES/DRESSINGS) ×1
COVER LIGHT HANDLE STERIS (MISCELLANEOUS) ×6 IMPLANT
COVER WAND RF STERILE (DRAPES) ×3 IMPLANT
DRAPE C-ARM XRAY 36X54 (DRAPES) ×1 IMPLANT
DRAPE INCISE IOBAN 66X45 STRL (DRAPES) ×3 IMPLANT
DRAPE LAPAROTOMY 77X122 PED (DRAPES) ×3 IMPLANT
DRSG TEGADERM 4X4.75 (GAUZE/BANDAGES/DRESSINGS) ×5 IMPLANT
DRSG TEGADERM 6X8 (GAUZE/BANDAGES/DRESSINGS) ×3 IMPLANT
DRSG TELFA 4X3 1S NADH ST (GAUZE/BANDAGES/DRESSINGS) ×2 IMPLANT
ELECT REM PT RETURN 9FT ADLT (ELECTROSURGICAL) ×3
ELECTRODE REM PT RTRN 9FT ADLT (ELECTROSURGICAL) ×1 IMPLANT
GLOVE BIO SURGEON STRL SZ7.5 (GLOVE) ×3 IMPLANT
GLOVE BIO SURGEON STRL SZ8 (GLOVE) ×3 IMPLANT
GOWN STRL REUS W/ TWL LRG LVL3 (GOWN DISPOSABLE) ×1 IMPLANT
GOWN STRL REUS W/ TWL XL LVL3 (GOWN DISPOSABLE) ×1 IMPLANT
GOWN STRL REUS W/TWL LRG LVL3 (GOWN DISPOSABLE) ×3
GOWN STRL REUS W/TWL XL LVL3 (GOWN DISPOSABLE) ×3
GRADUATE 1200CC STRL 31836 (MISCELLANEOUS) ×3 IMPLANT
IPG PACE AZUR XT DR MRI W1DR01 (Pacemaker) IMPLANT
IV NS 1000ML (IV SOLUTION) ×3
IV NS 1000ML BAXH (IV SOLUTION) ×1 IMPLANT
KIT TURNOVER KIT A (KITS) ×3 IMPLANT
NDL FILTER BLUNT 18X1 1/2 (NEEDLE) ×1 IMPLANT
NDL HYPO 25X1 1.5 SAFETY (NEEDLE) ×1 IMPLANT
NDL SPNL 22GX3.5 QUINCKE BK (NEEDLE) ×1 IMPLANT
NEEDLE FILTER BLUNT 18X 1/2SAF (NEEDLE) ×2
NEEDLE FILTER BLUNT 18X1 1/2 (NEEDLE) ×1 IMPLANT
NEEDLE HYPO 25X1 1.5 SAFETY (NEEDLE) ×3 IMPLANT
NEEDLE SPNL 22GX3.5 QUINCKE BK (NEEDLE) ×3 IMPLANT
NS IRRIG 500ML POUR BTL (IV SOLUTION) ×3 IMPLANT
PACE AZURE XT DR MRI W1DR01 (Pacemaker) ×3 IMPLANT
PACK BASIN MINOR ARMC (MISCELLANEOUS) ×3 IMPLANT
PACK PACE INSERTION (MISCELLANEOUS) ×3 IMPLANT
PAD STATPAD (MISCELLANEOUS) ×3 IMPLANT
STRAP SAFETY 5IN WIDE (MISCELLANEOUS) ×3 IMPLANT
STRIP CLOSURE SKIN 1/2X4 (GAUZE/BANDAGES/DRESSINGS) ×2 IMPLANT
SUT SILK 2 0 SH (SUTURE) ×3 IMPLANT
SUT VIC AB 2-0 CT1 27 (SUTURE) ×3
SUT VIC AB 2-0 CT1 TAPERPNT 27 (SUTURE) ×1 IMPLANT
SUT VIC AB 2-0 CT2 27 (SUTURE) ×3 IMPLANT
SUT VIC AB 3-0 PS2 18 (SUTURE) ×3 IMPLANT
SUT VIC AB 4-0 PS2 18 (SUTURE) ×3 IMPLANT
SYR 10ML LL (SYRINGE) ×3 IMPLANT
SYR BULB IRRIG 60ML STRL (SYRINGE) ×3 IMPLANT
SYR CONTROL 10ML (SYRINGE) ×3 IMPLANT

## 2018-05-01 NOTE — Interval H&P Note (Signed)
History and Physical Interval Note:  05/01/2018 11:05 AM  [name]  has presented today for surgery, with the diagnosis of SSS  The various methods of treatment have been discussed with the patient and family. After consideration of risks, benefits and other options for treatment, the patient has consented to  Procedure(s): PACEMAKER BATTERY CHANGE CURRENT=ST. JUDE, NEW=MEDTRONIC (N/A) as a surgical intervention .  The patient's history has been reviewed, patient examined, no change in status, stable for surgery.  I have reviewed the patient's chart and labs.  Questions were answered to the patient's satisfaction.      [employer]

## 2018-05-01 NOTE — Anesthesia Post-op Follow-up Note (Signed)
Anesthesia QCDR form completed.        

## 2018-05-01 NOTE — Transfer of Care (Signed)
Immediate Anesthesia Transfer of Care Note  Patient: [name]  Procedure(s) Performed: PACEMAKER BATTERY CHANGE CURRENT=ST. JUDE, NEW=MEDTRONIC (N/A )  Patient Location: PACU  Anesthesia Type:General  Level of Consciousness: awake  Airway & Oxygen Therapy: Patient Spontanous Breathing and Patient connected to face mask oxygen  Post-op Assessment: Report given to RN and Post -op Vital signs reviewed and stable  Post vital signs: stable  Last Vitals:  Vitals Value Taken Time  BP 143/69 05/01/2018  1:20 PM  Temp 36.9 C 05/01/2018  1:20 PM  Pulse 67 05/01/2018  1:23 PM  Resp 19 05/01/2018  1:23 PM  SpO2 100 % 05/01/2018  1:23 PM  Vitals shown include unvalidated device data.  Last Pain:  Vitals:   05/01/18 1320  TempSrc:   PainSc: 0-No pain         Complications: No apparent anesthesia complications

## 2018-05-01 NOTE — H&P (Signed)
Jump to Section ? Document InformationEncounter DetailsGoalsLast Filed Vital SignsMiscellaneous ResultsPatient ContactsPatient DemographicsPlan of TreatmentProceduresProgress NotesReason for ReferralReason for VisitSocial HistoryVisit Diagnoses [name] Encounter Summary, generated on Jan. 27, 2020January 27, 2020 Printout Information  Document Contents Document Received Date Document Source Organization  Office Visit Jan. 27, 2020January 27, 2020 [place]   Patient Demographics - 85 y.o. Female; born [birth year]  Patient Address Communication Language Race / Ethnicity Marital Status  [employer] [place] [address] [phone] [facility]) [phone] (Mobile) English (Preferred) White / Not Hispanic or Latino Widowed  Reason for Referral  Procedure (Routine) Procedure (Routine)  Status Reason Specialty Diagnoses / Procedures Referred By Contact Referred To Contact  Authorized   Diagnoses  SOBOE (shortness of breath on exertion)    Procedures  Echo complete  Flossie Dibble, MD  [address]  [facility]  [place], [place] [address]  Phone: [phone]  Fax: [phone]        Reason for Visit  Reason Comments  Pacer-ICD   Atrial Fibrillation   Dizziness    Encounter Details  Date Type Department Care Team Description  04/01/2018 Office Visit [facility]  [place], [place] [address]  [phone]  Flossie Dibble, MD  [address]  [facility]  [place], [place] [address]  [phone]  [phone] (Fax)  Dizziness (Primary Dx);  Benign essential hypertension;  Longstanding persistent atrial fibrillation;  SOBOE (shortness of breath on exertion)   Social History - documented as of this encounter Tobacco Use Types Packs/Day Years Used Date  Never Smoker      Smokeless Tobacco: Never Used       Alcohol Use Drinks/Week oz/Week Comments  Yes 1 Glasses of wine  1.0    Sex Assigned at [employer] Date Recorded  Not on file    Job Start Date Occupation Industry  Not on file Not on file Not on file   Travel History Travel Start Travel End  No recent travel history available.     Last Filed Vital Signs - documented in this encounter Vital Sign Reading Time Taken Comments  Blood Pressure 136/84 04/01/2018 11:05 AM EST   Pulse 69 04/01/2018 11:05 AM EST   Temperature - -   Respiratory Rate - -   Oxygen Saturation 95% 04/01/2018 11:05 AM EST   Inhaled Oxygen Concentration - -   Weight 59 kg (130 lb 1.1 oz) 04/01/2018 11:05 AM EST   Height 154.9 cm (5\' 1" ) 04/01/2018 11:05 AM EST   Body Mass Index 24.58 04/01/2018 11:05 AM EST    Progress Notes - documented in this encounter Flossie Dibble, MD - 04/01/2018 10:30 AM EST Formatting of this note might be different from the original. Established Patient Visit   Chief Complaint: Chief Complaint  Patient presents with  . Pacer-ICD  . Atrial Fibrillation  . Dizziness  Date of Service: 04/01/2018 Date of Birth: [birth year] PCP: Cheryll Cockayne, MD  History of Present Illness: [name] is a 85 y.o.female patient  Dizziness The patient has had Acute dizziness over the last 1 weeks associated with moving from lying to sitting position and moving from sitting to standing position with variable relief. These symptoms appear to be worsening with increased frequency. Other symptoms include lightheaded, dimming vision, near syncope and unsteadiness. Possible causes include arrhythmia and dehydration/hypovolemia and unknown Pacemaker Interrogation The patient has had a pacemaker interrogation which has shown that the  pacemaker battery has reached end of life. We have had further discussion of battery change out with all of the risks and benefits. Otherwise the patient has not had any significant side effects or symptoms  of the pacemaker wires. Shortness of breath The patient presents with acute on chronic shortness of breath worsening with increased severity over the last 2 weeks which occurs with moderate exertion but does not limits ADLs associated with climbing stairs and walking fast and relived by rest and lasting intermittent (<1 minute). Other related symptoms include fatigue and irregular heart beat. The differential diagnosis includes decrease exercise tolerance and rhythm disturbance  Permanent Atrial Fibrillation The patient has a diagnosis of permanent nonvalvular atrial fibrillation. The patient has been in permanent atrial fibrillation for years. The etiology of atrial fibrillation has included hypertension, valvular heart disease, sick sinus syndrome, structural heart disease and age. The patient has had good heart rate control using ca++blocker and is currently stable. The patient claims to have symptoms af atrial fibrillation including shortness of breath and palpitations. Therefore, they have a severity of atrial fibrillation score of 1. The patient has been on appropriate anticoagulation including warfarin. We have had a long discussion about the significance of rate control and anticoagulation for risk reduction of congestive heart failure and stroke respectively. The patient understands the risks and benefits of the current treatment plan.   Past Medical and Surgical History  Past Medical History Past Medical History:  Diagnosis Date  . Age related osteoporosis ([facility]) 01/20/2014  a. Fosamax.  . Atrial fibrillation (CMS-HCC)  Intermittent atrial fibrillation/flutter with sick sinus syndrome status post previous ablation and dual chamber pacemaker placement 6/10. Followed by Dr. Nehemiah Massed  . B12 deficiency  . Chest pain  a. cardiac catheterization in 1/00 revealed no coronary artery disease. b. cardiac catheterization at [facility] 8/03 revealed no coronary disease.  . Diverticulitis  . Hyperlipidemia   . Hypertension  systemic atrial hypertension  . Osteoarthritis  . PMR (polymyalgia rheumatica) (CMS-HCC)  . Valvular heart disease   Past Surgical History She has a past surgical history that includes Insert / replace / remove pacemaker (09/2008); colonoscopy (12/20/2009); Endometrial polyp excision; and S/P breast biopsy.   Medications and Allergies  Current Medications  Current Outpatient Medications on File Prior to Visit  Medication Sig Dispense Refill  . apixaban (ELIQUIS) 2.5 mg tablet Take 1 tablet (2.5 mg total) by mouth 2 (two) times daily 60 tablet 11  . azelastine (ASTELIN) 137 mcg nasal spray Place 1 spray into both nostrils 2 (two) times daily as needed for Rhinitis 30 mL 12  . biotin 1 mg Cap Take 5,000 mg by mouth.   . cyanocobalamin (VITAMIN B12) 1,000 mcg/mL injection Inject 1 mL (1,000 mcg total) into the muscle monthly 1 mL 11  . denosumab (PROLIA) 60 mg/mL inj syringe Inject subcutaneously once Two times a year  . diltiazem (CARDIZEM CD) 120 MG XR capsule TAKE 1 CAPSULE BY MOUTH ONCE DAILY. 90 capsule 1  . loratadine (CLARITIN) 10 mg tablet Take 1 tablet (10 mg total) by mouth once daily 90 tablet 3  . spironolactone (ALDACTONE) 25 MG tablet TAKE 1 TABLET BY MOUTH ONCE DAILY 90 tablet 1  . azithromycin (ZITHROMAX) 250 MG tablet Take 2 tablets (500mg ) by mouth on Day 1. Take 1 tablet (250mg ) by mouth on Days 2-5. (Patient not taking: Reported on 04/01/2018 ) 6 tablet 1   No current facility-administered medications on file prior to visit.   Allergies: Patient has no  known allergies.  Social and Family History  Social History reports that she has never smoked. She has never used smokeless tobacco. She reports current alcohol use of about 1.0 standard drinks of alcohol per week.  Family History Family History  Problem Relation Age of Onset  . Heart failure Mother  . Osteoporosis (Thinning of bones) Father   Review of Systems   Review of Systems  Positive  for dizziness Negative for weight gain weight loss, weakness, vision change, hearing loss, cough, congestion, PND, orthopnea, heartburn, nausea, diaphoresis, vomiting, diarrhea, bloody stool, melena, stomach pain, extremity pain, leg weakness, leg cramping, leg blood clots, headache, blackouts, nosebleed, trouble swallowing, mouth pain, urinary frequency, urination at night, muscle weakness, skin lesions, skin rashes, tingling ,ulcers, numbness, anxiety, and/or depression Physical Examination   Vitals:BP 136/84  Pulse 69  Ht 154.9 cm (5\' 1" )  Wt 59 kg (130 lb 1.1 oz)  SpO2 95%  BMI 24.58 kg/m  Ht:154.9 cm (5\' 1" ) Wt:59 kg (130 lb 1.1 oz) YWV:PXTG surface area is 1.59 meters squared. Body mass index is 24.58 kg/m. Appearance: well appearing in no acute distress HEENT: Pupils equally reactive to light and accomodation, no xanthalasma  Neck: Supple, no apparent thyromegaly, masses, or lymphadenopathy  Lungs: normal respiratory effort; no crackles, no rhonchi, no wheezes Heart: irregular rate and rhythm. Normal S1 S2 No gallops, murmur, no rub, PMI is normal size and placement. carotid upstroke normal without bruit. Jugular venous pressure is normal Abdomen: soft, nontender, not distended with normal bowel sounds. No apparent hepatosplenomegally. Abdominal aorta is normal size  Extremities: no edema, no ulcers, no clubbing, no cyanosis Peripheral Pulses: 2+ in upper extremities, 2+ femoral pulses bilaterally, 2+lower extremity  Musculoskeletal; Normal muscle tone without kyphosis Neurological: Oriented and Alert, Cranial nerves intact  Assessment   85 y.o. female with  Encounter Diagnoses  Name Primary?  . Dizziness Yes  . Benign essential hypertension  . Longstanding persistent atrial fibrillation  . SOBOE (shortness of breath on exertion)   Plan  -Echocardiogram for further evaluation of dyspnea, fatigue and irregular heart beat with atrial fibrillation, valvular heart disease and  cardiomegally  -No further intervention shortness of breath reported above multifactorial in nature including age, decreased exercise tolerance, disability, and/or medication management. The patient is to follow for any worsening symptoms or increase in severity for further in need in investigation or treatment options.  -no further intervention or diagnostic studies at this time for dizziness multifactorial in nature. We will continue to follow closely for any further significant dizziness resulting in syncope and/or related to cardiovascular disease and education on when to seek further evaluation -Continue current treatment plan for heart rate control of atrial fibrillation and/or maintenance of normal sinus rhythm. There appears to be no current evidence of sick sinus syndrome and/or significant worsening tachycardia and/or heart block. The patient will watch closely for any new significant symptoms of dizziness, weakness, palpitations, atrial fibrillation recurrence and/or need for adjustments of medication management. --the patient will have a consultation and or surgical treatment for recent pacemaker interrogation showing the battery to be end of life. The patient will need a pacemaker battery change out. Patient understands all risks and benefits of battery change out. This includes the possibility of death, stroke, heart attack, infection, bleeding, blood clot, and or side effects of medication management for anesthesia and agrees to proceed -ct ob brain for subdural hematoma  Orders Placed This Encounter  Procedures  . Echo complete   No follow-ups on file.  Flossie Dibble, MD    Electronically signed by Flossie Dibble, MD at 04/01/2018 12:00 PM EST   Plan of Treatment - documented as of this encounter Upcoming Encounters Upcoming Encounters  Date Type Specialty Care Team Description  04/29/2018 Ancillary Procedure Cardiology Flossie Dibble, MD  [address]  [facility]  [place], [place] [address]  [phone]  [phone] (Fax)    08/05/2018 Ancillary Orders Lab Cheryll Cockayne, MD  [address]  [facility] [place], [place] [address]  [phone]  [phone] (Fax)    08/14/2018 Office Visit Internal Medicine Cheryll Cockayne, Emery Tamms  [facility] [place], [place] [address]  [phone]  [phone] (Fax)     Scheduled Orders Scheduled Orders  Name Type Priority Associated Diagnoses Order Schedule  Echo complete Echocardiography Routine SOBOE (shortness of breath on exertion)  1 Occurrences starting 04/01/2018 until 04/02/2019   Goals - documented as of this encounter Goal Patient Goal Type Associated Problems Recent Progress Patient-Stated? Author  No goals   General  On track (02/12/2018 4:14 PM EST) Yes Henrietta Dine, CMA   Procedures - documented in this encounter Procedure Name Priority Date/Time Associated Diagnosis Comments  CARDIAC RHYTHM MANAGEMENT DEVICE  04/02/2018 12:00 AM EST  Results for this procedure are in the results section.    Miscellaneous Results - documented in this encounter  CARDIAC RHYTHM MANAGEMENT DEVICE (04/02/2018 12:00 AM EST) CARDIAC RHYTHM MANAGEMENT DEVICE (04/02/2018 12:00 AM EST)  Narrative Performed At  This result has an attachment that is not available.  Ordered by an unspecified provider.       Visit Diagnoses - documented in this encounter Diagnosis  Dizziness - Primary  Dizziness and giddiness   Benign essential hypertension  Essential hypertension, benign   Longstanding persistent atrial fibrillation   SOBOE (shortness of breath on exertion)  Shortness of breath   Images  Patient Contacts  Contact Name Contact Address Communication Relationship to Patient  Jan Atkins Unknown [phone] [facility]) Son or Daughter, Emergency Contact  Document Information  Primary  Care Provider Other Service Providers Document Coverage Dates  Sheliah Hatch, MD (Oct. 28, 2015October 28, 2015 - Present) DM: [phone] (Work) [phone] (Fax) [facility] [place], [place] [address] Internal Medicine [facility] [address] [place], [place] [address]  Dec. 30, 2019December 30, 2019   [place] [address] [place], [place] [address]   Encounter Providers Encounter Date  Flossie Dibble, MD (Attending) DM: [phone] (Work) [phone] (Fax) [name] [place], [place] [address] Cardiovascular Disease Dec. 30, 2019December 30, 2019    Show All Sections

## 2018-05-01 NOTE — Anesthesia Postprocedure Evaluation (Signed)
Anesthesia Post Note  Patient: [name]  Procedure(s) Performed: PACEMAKER BATTERY CHANGE CURRENT=[facility] JUDE, NEW=MEDTRONIC (N/A )  Patient location during evaluation: PACU Anesthesia Type: General Level of consciousness: awake and alert and oriented Pain management: pain level controlled Vital Signs Assessment: post-procedure vital signs reviewed and stable Respiratory status: spontaneous breathing, nonlabored ventilation and respiratory function stable Cardiovascular status: blood pressure returned to baseline and stable Postop Assessment: no signs of nausea or vomiting Anesthetic complications: no     Last Vitals:  Vitals:   05/01/18 1335 05/01/18 1346  BP: 132/68 (!) 150/76  Pulse: (!) 59 62  Resp: 13 16  Temp:  36.6 C  SpO2: 99% 100%    Last Pain:  Vitals:   05/01/18 1346  TempSrc: Temporal  PainSc: 0-No pain                  

## 2018-05-01 NOTE — Op Note (Signed)
[facility]   05/01/2018                     1:13 PM  PATIENT:  [name]    PRE-OPERATIVE DIAGNOSIS:  SSS  POST-OPERATIVE DIAGNOSIS:  Same  PROCEDURE:  PACEMAKER BATTERY CHANGE CURRENT=[facility]. JUDE, NEW=MEDTRONIC  SURGEON:  Isaias Cowman, MD    ANESTHESIA:     PREOPERATIVE INDICATIONS:  [name] is a  85 y.o. female with a diagnosis of SSS who failed conservative measures and elected for surgical management.    The risks benefits and alternatives were discussed with the patient preoperatively including but not limited to the risks of infection, bleeding, cardiopulmonary complications, the need for revision surgery, among others, and the patient was willing to proceed.   OPERATIVE PROCEDURE: Patient was brought to the operating room in a fasting state.  The left pectoral region was prepped and draped in usual sterile manner.  Anesthesia was obtained 1% lidocaine locally.  A 6 mm incision was performed the left pectoral region.  The old pacemaker generator was retrieved by electrocautery and blunt dissection.  The leads were disconnected and connected to a new MRI compatible, rate responsive, dual-chamber pacemaker generator ( Medtronic [id number] ).  The pacemaker pocket was irrigated with gentamicin solution.  The pacemaker generator was positioned into the pocket and the pocket was closed with 2-0 and 4-0 Vicryl, respectively.  Steri-Strips and pressure dressing were applied.  Postprocedural interrogation revealed appropriate dual-chamber atrial and ventricular sensing and pacing thresholds.

## 2018-05-01 NOTE — Anesthesia Preprocedure Evaluation (Signed)
Anesthesia Evaluation  Patient identified by MRN, date of birth, ID band Patient awake    Reviewed: Allergy & Precautions, NPO status , Patient's Chart, lab work & pertinent test results  History of Anesthesia Complications Negative for: history of anesthetic complications  Airway Mallampati: II  TM Distance: >3 FB Neck ROM: Full    Dental no notable dental hx.    Pulmonary neg sleep apnea, neg COPD,    breath sounds clear to auscultation- rhonchi (-) wheezing      Cardiovascular hypertension, Pt. on medications (-) CAD, (-) Past MI, (-) Cardiac Stents and (-) CABG + dysrhythmias Atrial Fibrillation + pacemaker  Rhythm:Regular Rate:Normal - Systolic murmurs and - Diastolic murmurs Echo 11/06/73: NORMAL LEFT VENTRICULAR SYSTOLIC FUNCTION NORMAL RIGHT VENTRICULAR SYSTOLIC FUNCTION MODERATE VALVULAR REGURGITATION (mod MR) NO VALVULAR STENOSIS PACER WIRE NOTED   Neuro/Psych neg Seizures negative neurological ROS  negative psych ROS   GI/Hepatic negative GI ROS, Neg liver ROS,   Endo/Other  negative endocrine ROSneg diabetes  Renal/GU Renal InsufficiencyRenal disease     Musculoskeletal  (+) Arthritis ,   Abdominal (+) - obese,   Peds  Hematology negative hematology ROS (+)   Anesthesia Other Findings Past Medical History: No date: Arthritis No date: Atrial fibrillation (HCC) No date: Cancer ([place])     Comment:  Basal cell many times on face No date: CHF (congestive heart failure) (HCC) No date: Coronary artery disease No date: Dyspnea     Comment:  sob when walking stairs No date: Dysrhythmia     Comment:  AF/SSS/NSR No date: Fibroid No date: Menopausal symptoms 2018: Osteoporosis     Comment:  T score -2.8 stable/improved from prior DEXA No date: Polymyalgia (HCC) No date: Presence of permanent cardiac pacemaker   Reproductive/Obstetrics                             Anesthesia  Physical Anesthesia Plan  ASA: III  Anesthesia Plan: General   Post-op Pain Management:    Induction: Intravenous  PONV Risk Score and Plan: 2 and Propofol infusion  Airway Management Planned: Natural Airway  Additional Equipment:   Intra-op Plan:   Post-operative Plan:   Informed Consent: I have reviewed the patients History and Physical, chart, labs and discussed the procedure including the risks, benefits and alternatives for the proposed anesthesia with the patient or authorized representative who has indicated his/her understanding and acceptance.     Dental advisory given  Plan Discussed with: CRNA and Anesthesiologist  Anesthesia Plan Comments:         Anesthesia Quick Evaluation

## 2018-05-01 NOTE — Discharge Instructions (Signed)
Remove outer bandage on 05/02/2018.  Leave Steri-Strips on.  May shower 05/02/2018, keep Steri-Strips dry.   AMBULATORY SURGERY  DISCHARGE INSTRUCTIONS   1) The drugs that you were given will stay in your system until tomorrow so for the next 24 hours you should not:  A) Drive an automobile B) Make any legal decisions C) Drink any alcoholic beverage   2) You may resume regular meals tomorrow.  Today it is better to start with liquids and gradually work up to solid foods.  You may eat anything you prefer, but it is better to start with liquids, then soup and crackers, and gradually work up to solid foods.   3) Please notify your doctor immediately if you have any unusual bleeding, trouble breathing, redness and pain at the surgery site, drainage, fever, or pain not relieved by medication.    4) Additional Instructions:        Please contact your physician with any problems or Same Day Surgery at [phone], Monday through Friday 6 am to 4 pm, or [place] at [facility] number at [phone].

## 2018-06-20 ENCOUNTER — Telehealth: Payer: Self-pay | Admitting: *Deleted

## 2018-06-20 NOTE — Telephone Encounter (Addendum)
Annual exam  09/26/18 TF  Calcium  9.9           Date 04/29/2018  Upcoming dental procedures NO  Prior Authorization needed NO  Pt estimated Cost $0   PROLIA APPT 09/26/18   Coverage Details: This is a [employer] Plan and it covers the medicare Part B deductible and 100% of the excess charges

## 2018-07-22 ENCOUNTER — Encounter: Payer: Medicare Other | Admitting: Gynecology

## 2018-07-23 NOTE — Telephone Encounter (Signed)
R/s apt to 09/06/18 KW CMA

## 2018-09-06 ENCOUNTER — Encounter: Payer: Medicare Other | Admitting: Gynecology

## 2018-09-25 ENCOUNTER — Other Ambulatory Visit: Payer: Self-pay

## 2018-09-26 ENCOUNTER — Encounter: Payer: Medicare Other | Admitting: Gynecology

## 2018-09-26 ENCOUNTER — Encounter: Payer: Self-pay | Admitting: Gynecology

## 2018-09-26 ENCOUNTER — Ambulatory Visit (INDEPENDENT_AMBULATORY_CARE_PROVIDER_SITE_OTHER): Payer: Medicare Other | Admitting: Gynecology

## 2018-09-26 VITALS — BP 124/70 | Ht 62.0 in | Wt 129.0 lb

## 2018-09-26 DIAGNOSIS — N952 Postmenopausal atrophic vaginitis: Secondary | ICD-10-CM

## 2018-09-26 DIAGNOSIS — Z01419 Encounter for gynecological examination (general) (routine) without abnormal findings: Secondary | ICD-10-CM | POA: Diagnosis not present

## 2018-09-26 DIAGNOSIS — M810 Age-related osteoporosis without current pathological fracture: Secondary | ICD-10-CM

## 2018-09-26 MED ORDER — DENOSUMAB 60 MG/ML ~~LOC~~ SOSY
60.0000 mg | PREFILLED_SYRINGE | Freq: Once | SUBCUTANEOUS | Status: AC
  Administered 2018-09-26: 60 mg via SUBCUTANEOUS

## 2018-09-26 NOTE — Patient Instructions (Signed)
Follow-up for your Prolia shot when due.

## 2018-09-26 NOTE — Progress Notes (Signed)
    [name] 03-28-1927 [id number]        85 y.o.  [id number] for breast and pelvic Hodge.  Without gynecologic complaints.  Past medical history,surgical history, problem list, medications, allergies, family history and social history were all reviewed and documented as reviewed in the EPIC chart.  ROS:  Performed with pertinent positives and negatives included in the history, assessment and plan.   Additional significant findings : None   Hodge: [name] assistant Vitals:   09/26/18 1126  BP: 124/70  Weight: 129 lb (58.5 kg)  Height: 5\' 2"  (1.575 m)   Body mass index is 23.59 kg/m.  General appearance:  Normal affect, orientation and appearance. Skin: Grossly normal HEENT: Without gross lesions.  No cervical or supraclavicular adenopathy. Thyroid normal.  Lungs:  Clear without wheezing, rales or rhonchi Cardiac: RR, without RMG Abdominal:  Soft, nontender, without masses, guarding, rebound, organomegaly or hernia Breasts:  Examined lying and sitting without masses, retractions, discharge or axillary adenopathy. Pelvic:  Ext, BUS, Vagina: With atrophic changes  Cervix: With atrophic changes  Uterus: Anteverted, normal size, shape and contour, midline and mobile nontender   Adnexa: Without masses or tenderness    Anus and perineum: Normal   Rectovaginal: Normal sphincter tone without palpated masses or tenderness.    Assessment/Plan:  [name]  1. Postmenopausal/atrophic genital changes.  No significant menopausal symptoms or any vaginal bleeding. 2. Osteoporosis.  On Prolia for now for 4 years.  Had DEXA today at [facility].  Awaiting report.  Continue on Prolia every 6 months. 3. Mammography today at [facility].  Awaiting report.  Breast Hodge normal today. 4. Pap smear 2012.  No Pap smear done today.  We both agree to stop screening per current screening guidelines noting no history of significant abnormal Pap smears. 5. Sigmoidoscopy  2018. 6. Health maintenance.  No routine lab work done as patient does this elsewhere.  Follow-up 1 year, sooner as needed.   Anastasio Auerbach MD, 3:46 PM 09/26/2018

## 2018-09-27 ENCOUNTER — Encounter: Payer: Self-pay | Admitting: Gynecology

## 2018-10-01 ENCOUNTER — Encounter: Payer: Medicare Other | Admitting: Gynecology

## 2018-10-08 ENCOUNTER — Telehealth: Payer: Self-pay | Admitting: *Deleted

## 2018-10-08 DIAGNOSIS — M818 Other osteoporosis without current pathological fracture: Secondary | ICD-10-CM

## 2018-10-08 NOTE — Telephone Encounter (Signed)
-----   Message from Ramond Craver, [place] sent at 10/07/2018 12:01 PM EDT ----- Regarding: Dr. Cruzita Lederer referral I spoke with patient and she agrees to appointment.   Per Dr. Loetta Rough "Tell patient her bone density shows some loss at several of the measured sites.  The question is whether to continue on Prolia or to switch to a different medication.  I would recommend appointment with Dr Cruzita Lederer for her recommendations."

## 2018-10-08 NOTE — Telephone Encounter (Signed)
Referral placed at [place], they will call to schedule.  Patient aware.

## 2018-10-24 NOTE — Telephone Encounter (Signed)
Office has left message for patient to call. I called patient and reviewed voicemail and asked her to call [phone] to schedule.

## 2018-10-24 NOTE — Telephone Encounter (Signed)
Prolia Given 09/26/2018 Next injection 03/29/2019

## 2018-11-11 NOTE — Telephone Encounter (Signed)
Patient called back requesting referral switched to Dr.Thomas Honor Junes at [facility] this office is closer to her office notes faxed to [phone]. Once referral approved by MD they will call to schedule.

## 2019-01-09 ENCOUNTER — Encounter: Payer: Self-pay | Admitting: Gynecology

## 2019-03-03 ENCOUNTER — Telehealth: Payer: Self-pay | Admitting: *Deleted

## 2019-03-03 NOTE — Telephone Encounter (Signed)
Pt will start Reclast at another office per referral

## 2019-04-05 IMAGING — CT CT ABD-PELV W/ CM
2 of 5 series · 14 of 46 positions shown, 16 images · IV contrast (APPLIED)
Comparison: 10/31/2009

CLINICAL DATA: Generalized abdominal pain and diarrhea for the past
2 months.

EXAM:
CT ABDOMEN AND PELVIS WITH CONTRAST
TECHNIQUE: Multidetector CT imaging of the abdomen and pelvis was performed
using the standard protocol following bolus administration of
intravenous contrast.
CONTRAST:  75mL OMNIPAQUE IOHEXOL 300 MG/ML  SOLN

[Series 2: axial st · axial · 0.65mm/px · z∈[-453,-83]mm · 11 of 84 slices shown, 13 images]
[im 5/84  soft-tissue]
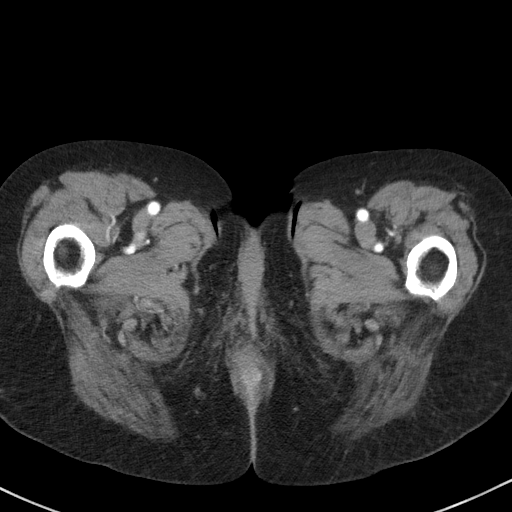
[im 5/84  bone]
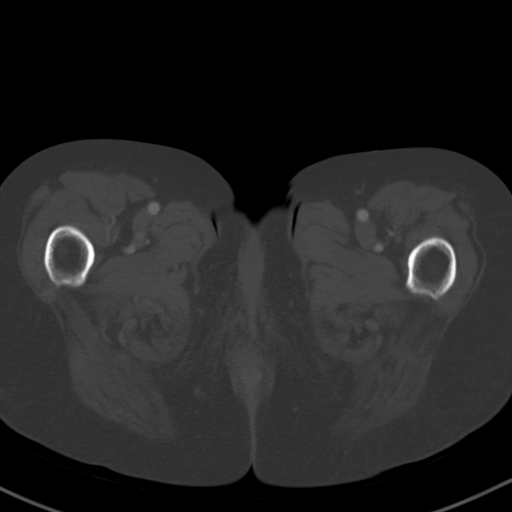
[im 13/84  soft-tissue]
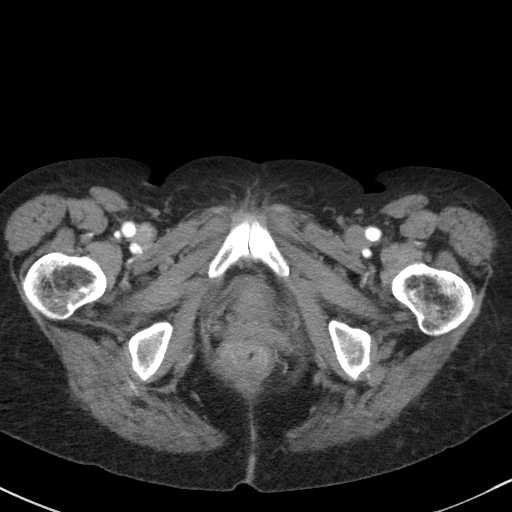
[im 21/84  soft-tissue]
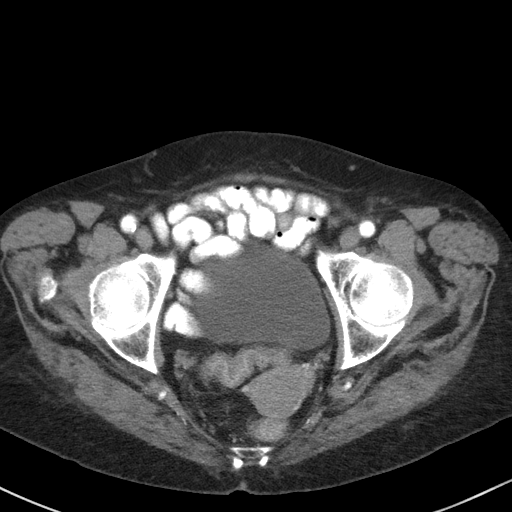
[im 30/84  soft-tissue]
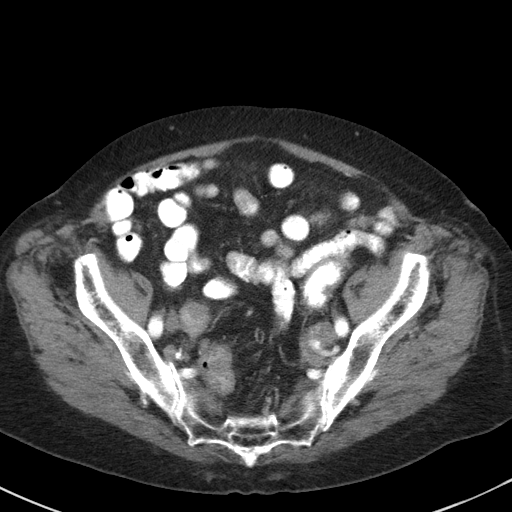
[im 34/84  soft-tissue]
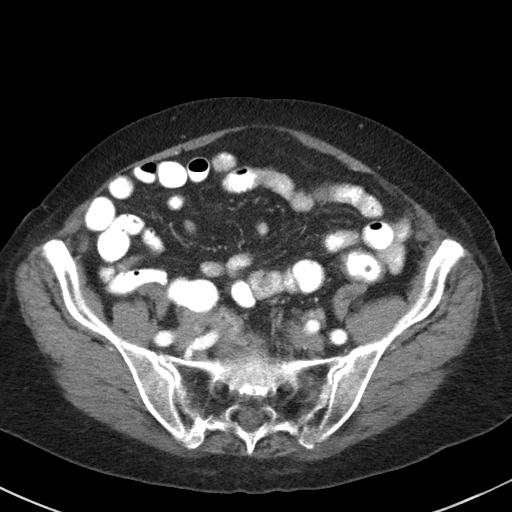
[im 42/84  soft-tissue]
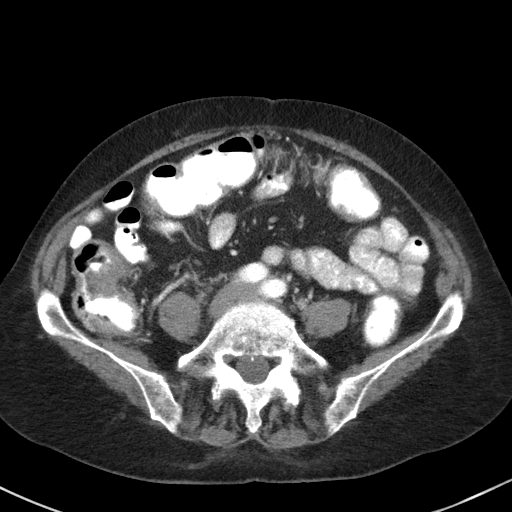
[im 50/84  soft-tissue]
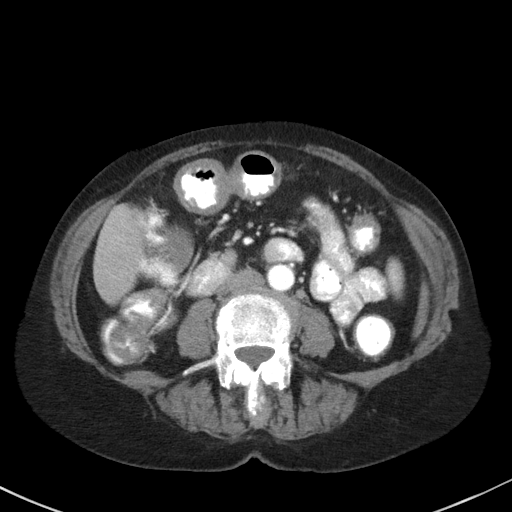
[im 54/84  soft-tissue]
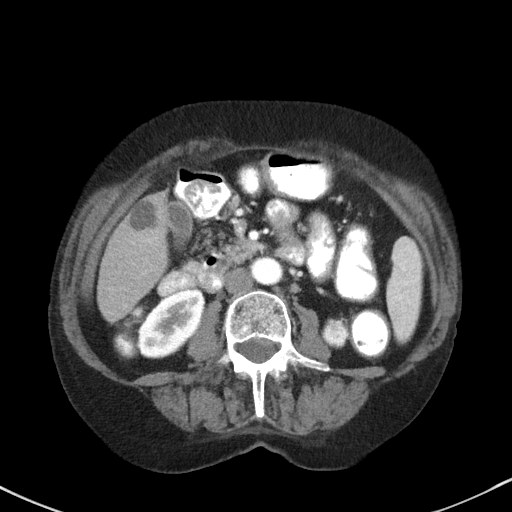
[im 63/84  soft-tissue]
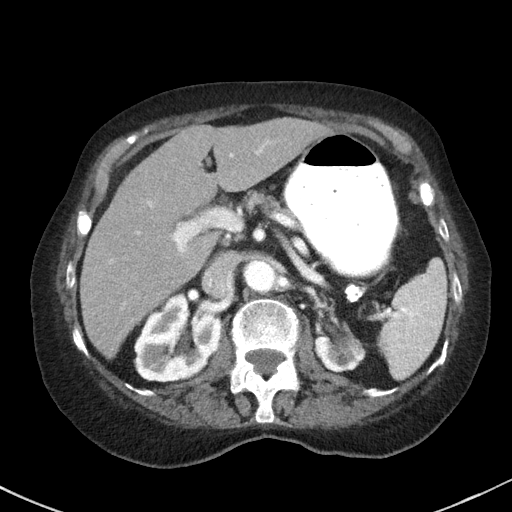
[im 63/84  bone]
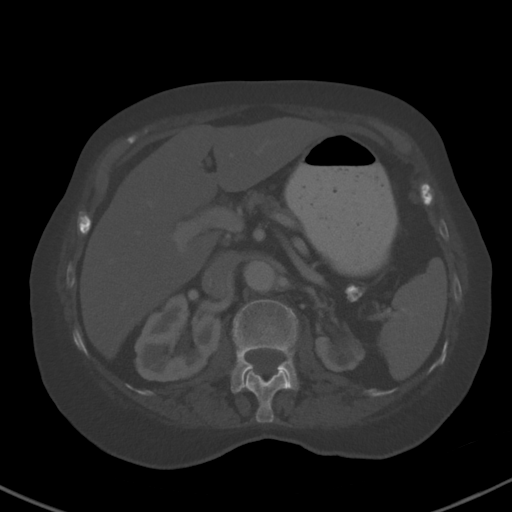
[im 71/84  soft-tissue]
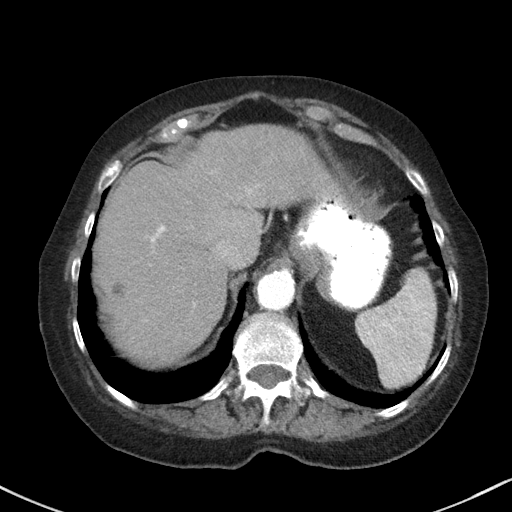
[im 79/84  soft-tissue]
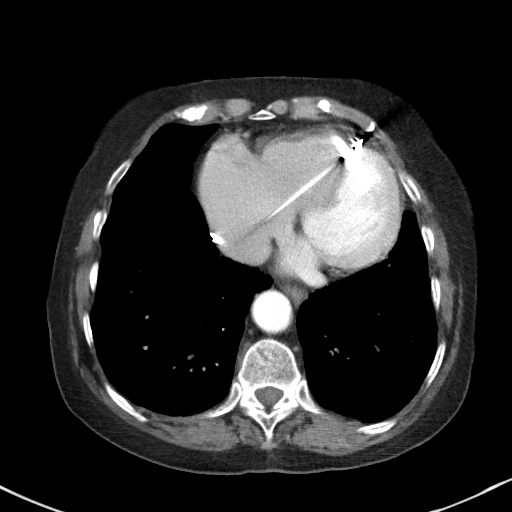

[Series 5: coronal st · coronal · 0.68mm/px · 3 of 83 slices shown]
[im 28/83  soft-tissue]
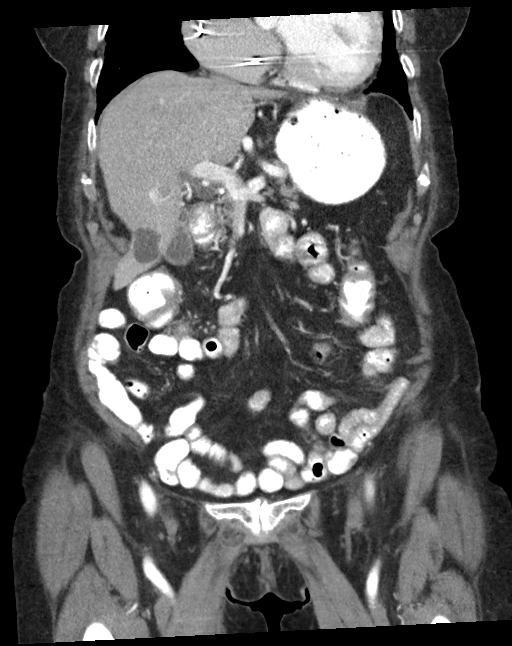
[im 37/83  soft-tissue]
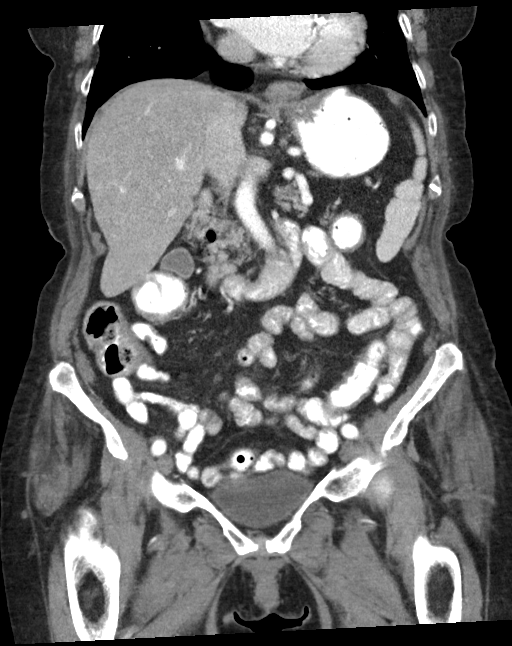
[im 46/83  soft-tissue]
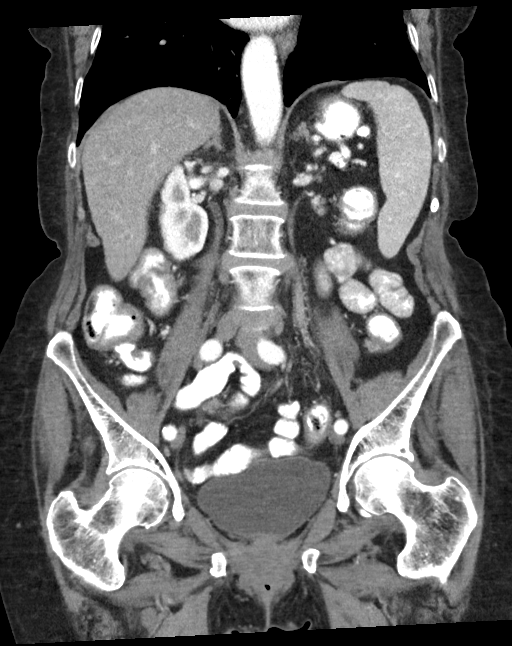

[14 of 46 positions shown; findings below may reference images not displayed]

FINDINGS: Lower chest: Limited visualization of the lower thorax demonstrates
minimal dependent subpleural ground-glass atelectasis. No focal
airspace opacities. No pleural effusion.

Cardiomegaly. Calcifications are seen within the mitral valve
annulus. Pacer leads are seen terminating within the right atrium
and ventricle. No pericardial effusion.

Hepatobiliary: Normal hepatic contour. There is diffuse decreased
attenuation of the hepatic parenchyma on this postcontrast
examination suggestive of hepatic steatosis. There is an
approximately 1.4 cm hypoattenuating (8 Hounsfield unit) cyst within
the anterior inferior aspect of the right lobe of the liver (image
31, series 2). Additional punctate (subcentimeter) hypoattenuating
hepatic lesions are too small to adequately characterize though
favored to represent additional hepatic cysts. No discrete worrisome
hepatic lesions. Normal appearance of the gallbladder given degree
distention. No definite intra extrahepatic biliary duct dilatation.
No ascites.

Pancreas: Normal appearance of the pancreas.

Spleen: Normal appearance of the spleen.

Adrenals/Urinary Tract: There is symmetric enhancement and excretion
of the bilateral kidneys however there is interval atrophy of the
left kidney in comparison to the right. Note is made of an
approximately 3.3 cm hypoattenuating (8 Hounsfield unit) cyst
arising from the superior pole of the left kidney. Adjacent punctate
(approximately 0.7 cm) hypoattenuating lesion within the superior
pole the left kidney as well as an approximately 0.7 cm
hypoattenuating lesion within the inferior pole of the right kidney
(image 22, series 7) are too small to accurately characterize though
favored to represent additional renal cysts. No definite renal
stones this postcontrast examination. No urinary obstruction or
perinephric stranding.

Normal appearance of the bilateral adrenal glands.

Normal appearance of the urinary bladder given degree distention.

Stomach/Bowel: Ingested enteric contrast extends to the level of the
sigmoid colon. Bowel is normal in course and caliber without wall
thickening or evidence of enteric obstruction. Normal appearance of
the terminal ileum and appendix. No discrete areas of bowel wall
thickening. No pneumoperitoneum, pneumatosis or portal venous gas.

Vascular/Lymphatic: The abdominal aorta is mildly tortuous but of
normal caliber. The major branch vessels of the abdominal aorta
appear widely patent on this non CTA examination.

Note is made of a peripherally calcified aneurysm within the splenic
hilum measuring approximately 1.2 x 0.9 cm (coronal image 49, series
5), unchanged compared to the [DATE] examination and thus of no
clinical concern in this postmenopausal patient.

Reproductive: Normal noncontrast appearance of the retroverted
uterus for age. No discrete adnexal lesion. No free fluid the pelvic
cul-de-sac.

Other: Regional soft tissues appear normal.

Musculoskeletal: No acute or aggressive osseous abnormality.
Bilateral facet degenerative change of the lower lumbar spine.
Degenerative change of the pubic symphysis.
IMPRESSION: 1. No definite explanation for patient's generalized abdominal pain
and diarrhea. Specifically, no evidence of enteric or urinary
obstruction. No discrete areas of bowel wall thickening. Normal
appearance of the appendix.
2. Compared to remote abdominal CT performed in 5266, there is been
interval atrophy of the left kidney in comparison to the right,
nonspecific though potentially the sequela of prior vascular insult.
Currently, there is no CT evidence end organ ischemia.

## 2019-04-25 ENCOUNTER — Ambulatory Visit
Admission: RE | Admit: 2019-04-25 | Discharge: 2019-04-25 | Disposition: A | Discharge status: Home / Self Care | Payer: Medicare Other | Source: Ambulatory Visit | Attending: Physician Assistant | Admitting: Physician Assistant

## 2019-04-25 ENCOUNTER — Other Ambulatory Visit: Payer: Self-pay | Admitting: Physician Assistant

## 2019-04-25 ENCOUNTER — Other Ambulatory Visit: Payer: Self-pay

## 2019-04-25 DIAGNOSIS — R270 Ataxia, unspecified: Secondary | ICD-10-CM | POA: Insufficient documentation

## 2019-06-09 ENCOUNTER — Other Ambulatory Visit
Admission: RE | Admit: 2019-06-09 | Discharge: 2019-06-09 | Disposition: A | Discharge status: Home / Self Care | Payer: Medicare Other | Source: Ambulatory Visit | Attending: Ophthalmology | Admitting: Ophthalmology

## 2019-06-09 DIAGNOSIS — H532 Diplopia: Secondary | ICD-10-CM | POA: Diagnosis present

## 2019-06-09 LAB — CBC WITH DIFFERENTIAL/PLATELET
Abs Immature Granulocytes: 0.02 10*3/uL (ref 0.00–0.07)
Basophils Absolute: 0 10*3/uL (ref 0.0–0.1)
Basophils Relative: 0 %
Eosinophils Absolute: 0.1 10*3/uL (ref 0.0–0.5)
Eosinophils Relative: 1 %
HCT: 45.9 % (ref 36.0–46.0)
Hemoglobin: 14.9 g/dL (ref 12.0–15.0)
Immature Granulocytes: 0 %
Lymphocytes Relative: 27 %
Lymphs Abs: 2.1 10*3/uL (ref 0.7–4.0)
MCH: 31.5 pg (ref 26.0–34.0)
MCHC: 32.5 g/dL (ref 30.0–36.0)
MCV: 97 fL (ref 80.0–100.0)
Monocytes Absolute: 0.8 10*3/uL (ref 0.1–1.0)
Monocytes Relative: 11 %
Neutro Abs: 4.6 10*3/uL (ref 1.7–7.7)
Neutrophils Relative %: 61 %
Platelets: 192 10*3/uL (ref 150–400)
RBC: 4.73 MIL/uL (ref 3.87–5.11)
RDW: 12.9 % (ref 11.5–15.5)
WBC: 7.7 10*3/uL (ref 4.0–10.5)
nRBC: 0 % (ref 0.0–0.2)

## 2019-06-09 LAB — C-REACTIVE PROTEIN: CRP: <0.5 mg/dL (ref <1.0)

## 2019-06-09 LAB — SEDIMENTATION RATE: Sed Rate: 5 mm/hr (ref 0–30)

## 2019-07-22 ENCOUNTER — Other Ambulatory Visit: Payer: Self-pay | Admitting: Neurology

## 2019-07-22 DIAGNOSIS — R42 Dizziness and giddiness: Secondary | ICD-10-CM

## 2019-07-30 ENCOUNTER — Other Ambulatory Visit: Payer: Self-pay

## 2019-07-30 ENCOUNTER — Ambulatory Visit
Admission: RE | Admit: 2019-07-30 | Discharge: 2019-07-30 | Disposition: A | Discharge status: Home / Self Care | Payer: Medicare Other | Source: Ambulatory Visit | Attending: Neurology | Admitting: Neurology

## 2019-07-30 ENCOUNTER — Other Ambulatory Visit: Payer: Self-pay | Admitting: Physician Assistant

## 2019-07-30 ENCOUNTER — Other Ambulatory Visit (HOSPITAL_COMMUNITY): Payer: Self-pay | Admitting: Physician Assistant

## 2019-07-30 DIAGNOSIS — M25552 Pain in left hip: Secondary | ICD-10-CM | POA: Diagnosis present

## 2019-07-30 DIAGNOSIS — R42 Dizziness and giddiness: Secondary | ICD-10-CM

## 2019-07-30 LAB — POCT I-STAT CREATININE: Creatinine, Ser: 1 mg/dL (ref 0.44–1.00)

## 2019-07-30 MED ORDER — IOHEXOL 350 MG/ML SOLN
75.0000 mL | Freq: Once | INTRAVENOUS | Status: AC | PRN
  Administered 2019-07-30: 75 mL via INTRAVENOUS

## 2021-04-12 ENCOUNTER — Encounter: Payer: Medicare Other | Attending: Physician Assistant | Admitting: Physician Assistant

## 2021-04-12 ENCOUNTER — Other Ambulatory Visit: Payer: Self-pay

## 2021-04-12 DIAGNOSIS — L97812 Non-pressure chronic ulcer of other part of right lower leg with fat layer exposed: Secondary | ICD-10-CM | POA: Diagnosis not present

## 2021-04-12 DIAGNOSIS — I480 Paroxysmal atrial fibrillation: Secondary | ICD-10-CM | POA: Diagnosis not present

## 2021-04-12 DIAGNOSIS — W2209XA Striking against other stationary object, initial encounter: Secondary | ICD-10-CM | POA: Diagnosis not present

## 2021-04-12 DIAGNOSIS — Z7901 Long term (current) use of anticoagulants: Secondary | ICD-10-CM | POA: Insufficient documentation

## 2021-04-12 DIAGNOSIS — S81811A Laceration without foreign body, right lower leg, initial encounter: Secondary | ICD-10-CM | POA: Diagnosis not present

## 2021-04-12 DIAGNOSIS — I110 Hypertensive heart disease with heart failure: Secondary | ICD-10-CM | POA: Diagnosis not present

## 2021-04-12 DIAGNOSIS — I5042 Chronic combined systolic (congestive) and diastolic (congestive) heart failure: Secondary | ICD-10-CM | POA: Insufficient documentation

## 2021-04-12 NOTE — Progress Notes (Signed)
[name], [name] ([id number]) Visit Report for 04/12/2021 Chief Complaint Document Details Patient Name: [name], [name]. Date of Service: 04/12/2021 12:45 PM Medical Record Number: [id number] Patient Account Number: [id number] Date of Birth/Sex: [birth year] (88 y.o. F) Treating RN: Levora Dredge Primary Care Provider: Ramonita Lab Other Clinician: Referring Provider: Mortimer Fries Treating Provider/Extender: Skipper Cliche in Treatment: 0 Information Obtained from: Patient Chief Complaint Right shin laceration Electronic Signature(s) Signed: 04/12/2021 1:33:10 PM By: Worthy Keeler PA-C Entered By: Worthy Keeler on 04/12/2021 13:33:09 [name] ([id number]) -------------------------------------------------------------------------------- Debridement Details Patient Name: [name], [name] Date of Service: 04/12/2021 12:45 PM Medical Record Number: [id number] Patient Account Number: [id number] Date of Birth/Sex: 25-Mar-1927 (88 y.o. F) Treating RN: Levora Dredge Primary Care Provider: Ramonita Lab Other Clinician: Referring Provider: Mortimer Fries Treating Provider/Extender: Skipper Cliche in Treatment: 0 Debridement Performed for Wound #1 Right Lower Leg Assessment: Performed By: Physician Tommie Sams., PA-C Debridement Type: Debridement Level of Consciousness (Pre- Awake and Alert procedure): Pre-procedure Verification/Time Out Yes - 13:41 Taken: Pain Control: Lidocaine 4% Topical Solution Total Area Debrided (L x W): 3.8 (cm) x 5 (cm) = 19 (cm) Tissue and other material Viable, Non-Viable, Slough, Subcutaneous, Skin: Dermis , Skin: Epidermis, Biofilm, Slough debrided: Level: Skin/Subcutaneous Tissue Debridement Description: Excisional Instrument: Curette, Forceps, Scissors Bleeding: Moderate Hemostasis Achieved: Pressure Response to Treatment: Procedure was tolerated well Level of Consciousness (Post- Awake and Alert procedure): Post  Debridement Measurements of Total Wound Length: (cm) 3.8 Width: (cm) 5 Depth: (cm) 0.2 Volume: (cm) 2.985 Character of Wound/Ulcer Post Debridement: Stable Post Procedure Diagnosis Same as Pre-procedure Electronic Signature(s) Signed: 04/12/2021 4:15:58 PM By: Worthy Keeler PA-C Signed: 04/12/2021 4:38:18 PM By: Levora Dredge Entered By: Levora Dredge on 04/12/2021 13:46:31 Doxtater, Purvis Kilts ([id number]) -------------------------------------------------------------------------------- HPI Details Patient Name: [name], [name] Date of Service: 04/12/2021 12:45 PM Medical Record Number: [id number] Patient Account Number: [id number] Date of Birth/Sex: 02/15/1927 (88 y.o. F) Treating RN: Levora Dredge Primary Care Provider: Ramonita Lab Other Clinician: Referring Provider: Mortimer Fries Treating Provider/Extender: Skipper Cliche in Treatment: 0 History of Present Illness HPI Description: 04/12/2021 upon evaluation today patient appears to be doing well in general in regard to her wound all things considered. She is unfortunately experiencing an issue with a fairly significant skin tear to the anterior portion of her leg on the right side Which occurred when she was in [place] December 5. She tells me that she was getting into a cab when she hit it on the Door and subsequently had a significant injury here. She ended up not going anywhere to have this checked at the hospital or urgent care. Subsequently she has been taking care of this for the most part on her own at home until just recently when she saw her primary care provider on this past week where they prescribed doxycycline for her and then placed her in an Unna boot with Telfa pads underneath. Subsequently the patient was advised to keep this on until she saw [place] and she did so. Nonetheless the biggest issue I see is really some of the skin was rolled up underneath and has reattached to some degree this can make it  difficult for this to heal in that way we can have to try to see what we can do to straighten this situation out. The patient does have a history of hypertension, long-term use of anticoagulant therapy, Coumadin, due to atrial fibrillation, congestive heart failure, and again the current wound  which we are managing today. Electronic Signature(s) Signed: 04/12/2021 3:06:56 PM By: Worthy Keeler PA-C Entered By: Worthy Keeler on 04/12/2021 15:06:55 [name] ([id number]) -------------------------------------------------------------------------------- Physical Exam Details Patient Name: [name], [name] Date of Service: 04/12/2021 12:45 PM Medical Record Number: [id number] Patient Account Number: [id number] Date of Birth/Sex: 29-Jun-1926 (88 y.o. F) Treating RN: Levora Dredge Primary Care Provider: Ramonita Lab Other Clinician: Referring Provider: Mortimer Fries Treating Provider/Extender: Skipper Cliche in Treatment: 0 Constitutional patient is hypertensive.. pulse regular and within target range for patient.Marland Kitchen respirations regular, non-labored and within target range for patient.Marland Kitchen temperature within target range for patient.. Well-nourished and well-hydrated in no acute distress. Eyes conjunctiva clear no eyelid edema noted. pupils equal round and reactive to light and accommodation. Ears, Nose, Mouth, and Throat no gross abnormality of ear auricles or external auditory canals. normal hearing noted during conversation. mucus membranes moist. Respiratory normal breathing without difficulty. Cardiovascular 2+ dorsalis pedis/posterior tibialis pulses. trace pitting edema of the bilateral lower extremities. Musculoskeletal normal gait and posture. no significant deformity or arthritic changes, no loss or range of motion, no clubbing. Psychiatric this patient is able to make decisions and demonstrates good insight into disease process. Alert and Oriented x 3. pleasant and  cooperative. Notes Upon inspection patient's wound bed actually showed signs of good granulation and epithelization at this point. Fortunately I do not see any signs of active infection locally nor systemically currently. I do not think that we have any significant issues here as far as healing is concerned other than the fact that she does have some slough and biofilm noted over the surface of the wound and she does have part of the skin that was torn back rolled up under the proximal or cephalad portion of the wound I was actually able to slip a skinny probe in between the tissue here and was actually able to fold it back out. Unfortunately it does not appear to be viable and I am going to have to trim that away. I did perform debridement to clear away the nonviable tissue here with scissors and forceps. I then subsequently also gave the surface of the wound a light cleaning with a #3 curette she did have some bleeding nothing too significant we were able to get this under control fairly easily without any complication. Electronic Signature(s) Signed: 04/12/2021 3:08:15 PM By: Worthy Keeler PA-C Entered By: Worthy Keeler on 04/12/2021 15:08:15 [name], [name] ([id number]) -------------------------------------------------------------------------------- Physician Orders Details Patient Name: [name], [name] Date of Service: 04/12/2021 12:45 PM Medical Record Number: [id number] Patient Account Number: [id number] Date of Birth/Sex: Nov 17, 1926 (88 y.o. F) Treating RN: Levora Dredge Primary Care Provider: Ramonita Lab Other Clinician: Referring Provider: Mortimer Fries Treating Provider/Extender: Skipper Cliche in Treatment: 0 Verbal / Phone Orders: No Diagnosis Coding ICD-10 Coding Code Description [id number] Laceration without foreign body, right lower leg, initial encounter L97.812 Non-pressure chronic ulcer of other part of right lower leg with fat layer exposed I10 Essential  (primary) hypertension Z79.01 Long term (current) use of anticoagulants I50.42 Chronic combined systolic (congestive) and diastolic (congestive) heart failure I48.0 Paroxysmal atrial fibrillation Follow-up Appointments o Return Appointment in 1 week. - pt will return after vacation in early February o Nurse Visit as needed [employer] o Wash wounds with antibacterial soap and water. o May shower; gently cleanse wound with antibacterial soap, rinse and pat dry prior to dressing wounds o No tub bath. Wound Treatment Wound #1 - Lower Leg Wound  Laterality: Right Cleanser: Byram Ancillary Kit - 15 Day Supply (DME) (Generic) 1 x Per Day/30 Days Discharge Instructions: Use supplies as instructed; Kit contains: (15) Saline Bullets; (15) 3x3 Gauze; 15 pr Gloves Primary Dressing: Xeroform-HBD 2x2 (in/in) (DME) (Generic) 1 x Per Day/30 Days Discharge Instructions: Apply Xeroform-HBD 2x2 (in/in) as directed Secondary Dressing: ABD Pad 5x9 (in/in) (DME) (Generic) 1 x Per Day/30 Days Discharge Instructions: Cover with ABD pad Secondary Dressing: Kerlix 4.5 x 4.1 (in/yd) (DME) (Generic) 1 x Per Day/30 Days Discharge Instructions: Apply Kerlix 4.5 x 4.1 (in/yd) as instructed Secured With: 39M Medipore H Soft Cloth Surgical Tape, 2x2 (in/yd) (DME) (Generic) 1 x Per Day/30 Days Secured With: Tubigrip Size E, 3.5x10 (in/yds) (DME) (Generic) 1 x Per Day/30 Days Discharge Instructions: Apply 3 Tubigrip E 3-finger-widths below knee to base of toes to secure dressing and/or for swelling. Electronic Signature(s) Signed: 04/12/2021 4:15:58 PM By: Worthy Keeler PA-C Signed: 04/12/2021 4:38:18 PM By: Levora Dredge Entered By: Levora Dredge on 04/12/2021 14:00:08 [name], [name] ([id number]) -------------------------------------------------------------------------------- Problem List Details Patient Name: [name], [name] Date of Service: 04/12/2021 12:45 PM Medical Record Number:  [id number] Patient Account Number: [id number] Date of Birth/Sex: 1926/09/23 (88 y.o. F) Treating RN: Levora Dredge Primary Care Provider: Ramonita Lab Other Clinician: Referring Provider: Mortimer Fries Treating Provider/Extender: Skipper Cliche in Treatment: 0 Active Problems ICD-10 Encounter Code Description Active Date MDM Diagnosis S81.811A Laceration without foreign body, right lower leg, initial encounter 04/12/2021 No Yes L97.812 Non-pressure chronic ulcer of other part of right lower leg with fat layer 04/12/2021 No Yes exposed [place] (primary) hypertension 04/12/2021 No Yes Z79.01 Long term (current) use of anticoagulants 04/12/2021 No Yes I50.42 Chronic combined systolic (congestive) and diastolic (congestive) heart 04/12/2021 No Yes failure I48.0 Paroxysmal atrial fibrillation 04/12/2021 No Yes Inactive Problems Resolved Problems Electronic Signature(s) Signed: 04/12/2021 1:32:19 PM By: Worthy Keeler PA-C Entered By: Worthy Keeler on 04/12/2021 13:32:19 Agner, Purvis Kilts ([id number]) -------------------------------------------------------------------------------- Progress Note Details Patient Name: [name] Date of Service: 04/12/2021 12:45 PM Medical Record Number: [id number] Patient Account Number: [id number] Date of Birth/Sex: [birth year] (88 y.o. F) Treating RN: Levora Dredge Primary Care Provider: Ramonita Lab Other Clinician: Referring Provider: Mortimer Fries Treating Provider/Extender: Skipper Cliche in Treatment: 0 Subjective Chief Complaint Information obtained from Patient Right shin laceration History of Present Illness (HPI) 04/12/2021 upon evaluation today patient appears to be doing well in general in regard to her wound all things considered. She is unfortunately experiencing an issue with a fairly significant skin tear to the anterior portion of her leg on the right side Which occurred when she was in [place] December 5. She  tells me that she was getting into a cab when she hit it on the Door and subsequently had a significant injury here. She ended up not going anywhere to have this checked at the hospital or urgent care. Subsequently she has been taking care of this for the most part on her own at home until just recently when she saw her primary care provider on this past week where they prescribed doxycycline for her and then placed her in an Unna boot with Telfa pads underneath. Subsequently the patient was advised to keep this on until she saw [place] and she did so. Nonetheless the biggest issue I see is really some of the skin was rolled up underneath and has reattached to some degree this can make it difficult for this to heal in that way we  can have to try to see what we can do to straighten this situation out. The patient does have a history of hypertension, long-term use of anticoagulant therapy, Coumadin, due to atrial fibrillation, congestive heart failure, and again the current wound which we are managing today. Patient History Allergies No Known Allergies General Notes: NKA Social History Never smoker, Marital Status - Widowed, Alcohol Use - Moderate - wine, Drug Use - No History, Caffeine Use - Never. Medical History Cardiovascular Patient has history of Congestive Heart Failure, Hypertension Medical And Surgical History Notes Cardiovascular a fib Musculoskeletal arthritis Review of Systems (ROS) Constitutional Symptoms (General Health) Denies complaints or symptoms of Fatigue, Fever, Chills, Marked Weight Change. Eyes Complains or has symptoms of Glasses / Contacts - reading. Ear/Nose/Mouth/Throat hearing aids bilat Hematologic/Lymphatic Denies complaints or symptoms of Bleeding / Clotting Disorders, Human Immunodeficiency Virus. Respiratory Denies complaints or symptoms of Chronic or frequent coughs, Shortness of Breath. Gastrointestinal Denies complaints or symptoms of Frequent diarrhea,  Nausea, Vomiting. Endocrine Denies complaints or symptoms of Hepatitis, Thyroid disease, Polydypsia (Excessive Thirst). Genitourinary Denies complaints or symptoms of Kidney failure/ Dialysis, Incontinence/dribbling. Immunological Denies complaints or symptoms of Hives, Itching. Integumentary (Skin) Denies complaints or symptoms of Wounds, Bleeding or bruising tendency, Breakdown, Swelling. Neurologic Denies complaints or symptoms of Numbness/parasthesias, Focal/Weakness. Oncologic basal cell face Psychiatric Denies complaints or symptoms of Anxiety, Claustrophobia. [name], [name] ([id number]) Objective Constitutional patient is hypertensive.. pulse regular and within target range for patient.Marland Kitchen respirations regular, non-labored and within target range for patient.Marland Kitchen temperature within target range for patient.. Well-nourished and well-hydrated in no acute distress. Vitals Time Taken: 12:50 PM, Height: 63 in, Source: Stated, Weight: 130 lbs, Source: Stated, BMI: 23, Temperature: 97.9 F, Pulse: 75 bpm, Respiratory Rate: 18 breaths/min, Blood Pressure: 147/84 mmHg. Eyes conjunctiva clear no eyelid edema noted. pupils equal round and reactive to light and accommodation. Ears, Nose, Mouth, and Throat no gross abnormality of ear auricles or external auditory canals. normal hearing noted during conversation. mucus membranes moist. Respiratory normal breathing without difficulty. Cardiovascular 2+ dorsalis pedis/posterior tibialis pulses. trace pitting edema of the bilateral lower extremities. Musculoskeletal normal gait and posture. no significant deformity or arthritic changes, no loss or range of motion, no clubbing. Psychiatric this patient is able to make decisions and demonstrates good insight into disease process. Alert and Oriented x 3. pleasant and cooperative. General Notes: Upon inspection patient's wound bed actually showed signs of good granulation and epithelization at  this point. Fortunately I do not see any signs of active infection locally nor systemically currently. I do not think that we have any significant issues here as far as healing is concerned other than the fact that she does have some slough and biofilm noted over the surface of the wound and she does have part of the skin that was torn back rolled up under the proximal or cephalad portion of the wound I was actually able to slip a skinny probe in between the tissue here and was actually able to fold it back out. Unfortunately it does not appear to be viable and I am going to have to trim that away. I did perform debridement to clear away the nonviable tissue here with scissors and forceps. I then subsequently also gave the surface of the wound a light cleaning with a #3 curette she did have some bleeding nothing too significant we were able to get this under control fairly easily without any complication. Integumentary (Hair, Skin) Wound #1 status is Open. Original cause  of wound was Trauma. The date acquired was: 03/07/2021. The wound is located on the Right Lower Leg. The wound measures 3.8cm length x 5cm width x 0.2cm depth; 14.923cm^2 area and 2.985cm^3 volume. There is Fat Layer (Subcutaneous Tissue) exposed. There is no tunneling noted, however, there is undermining starting at 3:00 and ending at 11:00. There is a medium amount of serosanguineous drainage noted. The wound margin is flat and intact. There is no granulation within the wound bed. There is a large (67-100%) amount of necrotic tissue within the wound bed. General Notes: boxed in wound to measure due to odd shape. Skin had folded up under itself Assessment Active Problems ICD-10 Laceration without foreign body, right lower leg, initial encounter Non-pressure chronic ulcer of other part of right lower leg with fat layer exposed Essential (primary) hypertension Long term (current) use of anticoagulants Chronic combined systolic  (congestive) and diastolic (congestive) heart failure Paroxysmal atrial fibrillation Procedures [name], BIBBINS. ([id number]) Wound #1 Pre-procedure diagnosis of Wound #1 is a Trauma, Other located on the Right Lower Leg . There was a Excisional Skin/Subcutaneous Tissue Debridement with a total area of 19 sq cm performed by Tommie Sams., PA-C. With the following instrument(s): Curette, Forceps, and Scissors to remove Viable and Non-Viable tissue/material. Material removed includes Subcutaneous Tissue, Slough, Skin: Dermis, Skin: Epidermis, and Biofilm after achieving pain control using Lidocaine 4% Topical Solution. No specimens were taken. A time out was conducted at 13:41, prior to the start of the procedure. A Moderate amount of bleeding was controlled with Pressure. The procedure was tolerated well. Post Debridement Measurements: 3.8cm length x 5cm width x 0.2cm depth; 2.985cm^3 volume. Character of Wound/Ulcer Post Debridement is stable. Post procedure Diagnosis Wound #1: Same as Pre-Procedure Plan Follow-up Appointments: Return Appointment in 1 week. - pt will return after vacation in early February Nurse Visit as needed Bathing/ Shower/ Hygiene: Wash wounds with antibacterial soap and water. May shower; gently cleanse wound with antibacterial soap, rinse and pat dry prior to dressing wounds No tub bath. WOUND #1: - Lower Leg Wound Laterality: Right Cleanser: Byram Ancillary Kit - 15 Day Supply (DME) (Generic) 1 x Per Day/30 Days Discharge Instructions: Use supplies as instructed; Kit contains: (15) Saline Bullets; (15) 3x3 Gauze; 15 pr Gloves Primary Dressing: Xeroform-HBD 2x2 (in/in) (DME) (Generic) 1 x Per Day/30 Days Discharge Instructions: Apply Xeroform-HBD 2x2 (in/in) as directed Secondary Dressing: ABD Pad 5x9 (in/in) (DME) (Generic) 1 x Per Day/30 Days Discharge Instructions: Cover with ABD pad Secondary Dressing: Kerlix 4.5 x 4.1 (in/yd) (DME) (Generic) 1 x Per Day/30  Days Discharge Instructions: Apply Kerlix 4.5 x 4.1 (in/yd) as instructed Secured With: 5M Medipore H Soft Cloth Surgical Tape, 2x2 (in/yd) (DME) (Generic) 1 x Per Day/30 Days Secured With: Tubigrip Size E, 3.5x10 (in/yds) (DME) (Generic) 1 x Per Day/30 Days Discharge Instructions: Apply 3 Tubigrip E 3-finger-widths below knee to base of toes to secure dressing and/or for swelling. 1. I would recommend currently that we initiate treatment with a Xeroform gauze dressing I think this is probably can to be the best way to go and I discussed with the patient that I prevent things from sticking though there may be some biofilm buildup. 2. Also can recommend that we have the patient continue with the ABD pad to cover followed by roll gauze to secure in place and then Tubigrip. 3. The biggest concern I have is that this patient is actually get a be going for the next month on the 15th  with her sister to [facility] in the [place] location and will be gone until February 7. This is a significant amount of time. My concern is simply that she may have issues that arise between now and then that nobody will be able to look at. I did try to reach out to the wound care center in [facility] but they were unable to see the patient until it is almost can be time for her to be back here which really was not worth it to be perfectly honest. Nonetheless I did give the patient instructions we will see how things do and make any adjustments as we need to for when she gets back she will have an appointment to see me at that time. We will see patient back for reevaluation in 1 week here in the clinic. If anything worsens or changes patient will contact our office for additional recommendations. Electronic Signature(s) Signed: 04/12/2021 3:09:49 PM By: Worthy Keeler PA-C Entered By: Worthy Keeler on 04/12/2021 15:09:49 CLYDENE, BURACK  ([id number]) -------------------------------------------------------------------------------- ROS/PFSH Details Patient Name: NADELYN, ENRIQUES Date of Service: 04/12/2021 12:45 PM Medical Record Number: [id number] Patient Account Number: [id number] Date of Birth/Sex: [birth year] (88 y.o. F) Treating RN: Levora Dredge Primary Care Provider: Ramonita Lab Other Clinician: Referring Provider: Mortimer Fries Treating Provider/Extender: Skipper Cliche in Treatment: 0 Constitutional Symptoms (General Health) Complaints and Symptoms: Negative for: Fatigue; Fever; Chills; Marked Weight Change Eyes Complaints and Symptoms: Positive for: Glasses / Contacts - reading Hematologic/Lymphatic Complaints and Symptoms: Negative for: Bleeding / Clotting Disorders; Human Immunodeficiency Virus Respiratory Complaints and Symptoms: Negative for: Chronic or frequent coughs; Shortness of Breath Gastrointestinal Complaints and Symptoms: Negative for: Frequent diarrhea; Nausea; Vomiting Endocrine Complaints and Symptoms: Negative for: Hepatitis; Thyroid disease; Polydypsia (Excessive Thirst) Genitourinary Complaints and Symptoms: Negative for: Kidney failure/ Dialysis; Incontinence/dribbling Immunological Complaints and Symptoms: Negative for: Hives; Itching Integumentary (Skin) Complaints and Symptoms: Negative for: Wounds; Bleeding or bruising tendency; Breakdown; Swelling Neurologic Complaints and Symptoms: Negative for: Numbness/parasthesias; Focal/Weakness Psychiatric Complaints and Symptoms: Negative for: Anxiety; Claustrophobia Ear/Nose/Mouth/Throat MANDI, MATTIOLI ([id number]) Complaints and Symptoms: Review of System Notes: hearing aids bilat Cardiovascular Medical History: Positive for: Congestive Heart Failure; Hypertension Past Medical History Notes: a fib Musculoskeletal Medical History: Past Medical History Notes: arthritis Oncologic Complaints and Symptoms: Review  of System Notes: basal cell face Immunizations Pneumococcal Vaccine: Received Pneumococcal Vaccination: Yes Received Pneumococcal Vaccination On or After 60th Birthday: Yes Implantable Devices Yes Family and Social History Never smoker; Marital Status - Widowed; Alcohol Use: Moderate - wine; Drug Use: No History; Caffeine Use: Never Electronic Signature(s) Signed: 04/12/2021 4:15:58 PM By: Worthy Keeler PA-C Signed: 04/12/2021 4:38:18 PM By: Levora Dredge Entered By: Levora Dredge on 04/12/2021 13:05:36 [place], Purvis Kilts ([id number]) -------------------------------------------------------------------------------- SuperBill Details Patient Name: LIBBY, GOEHRING Date of Service: 04/12/2021 Medical Record Number: [id number] Patient Account Number: [id number] Date of Birth/Sex: 03/28/1927 (88 y.o. F) Treating RN: Levora Dredge Primary Care Provider: Ramonita Lab Other Clinician: Referring Provider: Mortimer Fries Treating Provider/Extender: Skipper Cliche in Treatment: 0 Diagnosis Coding ICD-10 Codes Code Description [phone] Laceration without foreign body, right lower leg, initial encounter L97.812 Non-pressure chronic ulcer of other part of right lower leg with fat layer exposed I10 Essential (primary) hypertension Z79.01 Long term (current) use of anticoagulants I50.42 Chronic combined systolic (congestive) and diastolic (congestive) heart failure I48.0 Paroxysmal atrial fibrillation Facility Procedures CPT4 Code: 11572620 Description: 35597 - DEB SUBQ TISSUE 20 SQ CM/< Modifier: Quantity: 1 CPT4 Code:  Description: ICD-10 Diagnosis Description L97.812 Non-pressure chronic ulcer of other part of right lower leg with fat lay S81.811A Laceration without foreign body, right lower leg, initial encounter Modifier: er exposed Quantity: Physician Procedures CPT4 Code: 8016553 Description: WC PHYS LEVEL 3 o NEW PT Modifier: 25 Quantity: 1 CPT4 Code: Description:  ICD-10 Diagnosis Description S81.811A Laceration without foreign body, right lower leg, initial encounter L97.812 Non-pressure chronic ulcer of other part of right lower leg with fat lay [place] (primary) hypertension Z79.01 Long term  (current) use of anticoagulants Modifier: er exposed Quantity: CPT4 Code: 7482707 Description: 11042 - WC PHYS SUBQ TISS 20 SQ CM Modifier: Quantity: 1 CPT4 Code: Description: ICD-10 Diagnosis Description E67.544 Non-pressure chronic ulcer of other part of right lower leg with fat lay S81.811A Laceration without foreign body, right lower leg, initial encounter Modifier: er exposed Quantity: Electronic Signature(s) Signed: 04/12/2021 3:10:07 PM By: Worthy Keeler PA-C Entered By: Worthy Keeler on 04/12/2021 15:10:07

## 2021-04-12 NOTE — Progress Notes (Signed)
CLARISSE, RODRIGES ([id number]) Visit Report for 04/12/2021 Allergy List Details Patient Name: ADORIA, KAWAMOTO. Date of Service: 04/12/2021 12:45 PM Medical Record Number: [id number] Patient Account Number: [id number] Date of Birth/Sex: 1926/11/06 (88 y.o. F) Treating RN: Levora Dredge Primary Care : Ramonita Lab Other Clinician: Referring : Mortimer Fries Treating /Extender: Jeri Cos Weeks in Treatment: 0 Allergies Active Allergies No Known Allergies Allergy Notes NKA Electronic Signature(s) Signed: 04/12/2021 4:38:18 PM By: Levora Dredge Entered By: Levora Dredge on 04/12/2021 13:21:38 DIEDRE, MACLELLAN ([id number]) -------------------------------------------------------------------------------- Arrival Information Details Patient Name: RASHAWNA, SCOLES Date of Service: 04/12/2021 12:45 PM Medical Record Number: [id number] Patient Account Number: [id number] Date of Birth/Sex: 17-Nov-1926 (88 y.o. F) Treating RN: Levora Dredge Primary Care : Ramonita Lab Other Clinician: Referring : Mortimer Fries Treating /Extender: Skipper Cliche in Treatment: 0 Visit Information Patient Arrived: Ambulatory Arrival Time: 12:53 Accompanied By: son in law Transfer Assistance: None Patient Identification Verified: Yes Secondary Verification Process Completed: Yes Patient Has Alerts: Yes Patient Alerts: Patient on Blood Thinner Electronic Signature(s) Signed: 04/12/2021 2:23:13 PM By: Levora Dredge Entered By: Levora Dredge on 04/12/2021 14:23:12 [name] ([id number]) -------------------------------------------------------------------------------- Encounter Discharge Information Details Patient Name: EVONDA, ENGE Date of Service: 04/12/2021 12:45 PM Medical Record Number: [id number] Patient Account Number: [id number] Date of Birth/Sex: 1926-07-02 (88 y.o. F) Treating RN: Levora Dredge Primary Care :  Ramonita Lab Other Clinician: Referring : Mortimer Fries Treating /Extender: Skipper Cliche in Treatment: 0 Encounter Discharge Information Items Post Procedure Vitals Discharge Condition: Stable Temperature (F): 97.9 Ambulatory Status: Ambulatory Pulse (bpm): 75 Discharge Destination: Home Respiratory Rate (breaths/min): 18 Transportation: Private Auto Blood Pressure (mmHg): 147/84 Accompanied By: son in law Schedule Follow-up Appointment: Yes Clinical Summary of Care: Electronic Signature(s) Signed: 04/12/2021 4:36:06 PM By: Levora Dredge Entered By: Levora Dredge on 04/12/2021 16:36:06 Devries, Purvis Kilts ([id number]) -------------------------------------------------------------------------------- Lower Extremity Assessment Details Patient Name: SADIA, BELFIORE Date of Service: 04/12/2021 12:45 PM Medical Record Number: [id number] Patient Account Number: [id number] Date of Birth/Sex: May 03, 1926 (88 y.o. F) Treating RN: Levora Dredge Primary Care : Ramonita Lab Other Clinician: Referring : Mortimer Fries Treating /Extender: Jeri Cos Weeks in Treatment: 0 Vascular Assessment Pulses: Dorsalis Pedis Palpable: [Right:Yes] Doppler Audible: [Right:Yes] Posterior Tibial Palpable: [Right:Yes Yes] Notes Right  >220 Electronic Signature(s) Signed: 04/12/2021 4:38:18 PM By: Levora Dredge Entered By: Levora Dredge on 04/12/2021 13:21:13 Mcglade, Purvis Kilts ([id number]) -------------------------------------------------------------------------------- Multi Wound Chart Details Patient Name: CLARABELLE, OSCARSON Date of Service: 04/12/2021 12:45 PM Medical Record Number: [id number] Patient Account Number: [id number] Date of Birth/Sex: [birth year] (88 y.o. F) Treating RN: Levora Dredge Primary Care : Ramonita Lab Other Clinician: Referring : Mortimer Fries Treating /Extender: Skipper Cliche in Treatment:  0 Vital Signs Height(in): 63 Pulse(bpm): 75 Weight(lbs): 130 Blood Pressure(mmHg): 147/84 Body Mass Index(BMI): 23 Temperature(F): 97.9 Respiratory Rate(breaths/min): 18 Photos: [N/A:N/A] Wound Location: Right Lower Leg N/A N/A Wounding Event: Trauma N/A N/A Primary Etiology: Trauma, Other N/A N/A Comorbid History: Congestive Heart Failure, N/A N/A Hypertension Date Acquired: 03/07/2021 N/A N/A Weeks of Treatment: 0 N/A N/A Wound Status: Open N/A N/A Measurements L x W x D (cm) 1.8x3.2x0.2 N/A N/A Area (cm) : 4.524 N/A N/A Volume (cm) : 0.905 N/A N/A % Reduction in Area: 0.00% N/A N/A % Reduction in Volume: 0.00% N/A N/A Classification: Full Thickness Without Exposed N/A N/A Support Structures Exudate Amount: Medium N/A N/A Exudate Type: Serosanguineous N/A N/A Exudate Color: red, brown N/A N/A Wound Margin: Flat and Intact N/A N/A Granulation Amount:  None Present (0%) N/A N/A Necrotic Amount: Large (67-100%) N/A N/A Exposed Structures: Fat Layer (Subcutaneous Tissue): N/A N/A Yes Fascia: No Tendon: No Muscle: No Joint: No Bone: No Epithelialization: None N/A N/A Treatment Notes Electronic Signature(s) Signed: 04/12/2021 4:38:18 PM By: Levora Dredge Entered By: Levora Dredge on 04/12/2021 13:27:06 [name] ([id number]) -------------------------------------------------------------------------------- [place] Details Patient Name: TOWANDA, HORNSTEIN Date of Service: 04/12/2021 12:45 PM Medical Record Number: [id number] Patient Account Number: [id number] Date of Birth/Sex: Nov 24, 1926 (88 y.o. F) Treating RN: Levora Dredge Primary Care : Ramonita Lab Other Clinician: Referring : Mortimer Fries Treating /Extender: Skipper Cliche in Treatment: 0 Active Inactive Necrotic Tissue Nursing Diagnoses: Impaired tissue integrity related to necrotic/devitalized tissue Knowledge deficit related to management of  necrotic/devitalized tissue Goals: Necrotic/devitalized tissue will be minimized in the wound bed Date Initiated: 04/12/2021 Target Resolution Date: 05/09/2021 Goal Status: Active Patient/caregiver will verbalize understanding of reason and process for debridement of necrotic tissue Date Initiated: 04/12/2021 Target Resolution Date: 05/09/2021 Goal Status: Active Interventions: Assess patient pain level pre-, during and post procedure and prior to discharge Provide education on necrotic tissue and debridement process Treatment Activities: Apply topical anesthetic as ordered : 04/12/2021 Excisional debridement : 04/12/2021 Notes: Orientation to the Wound Care Program Nursing Diagnoses: Knowledge deficit related to the wound healing center program Goals: Patient/caregiver will verbalize understanding of the [place] Date Initiated: 04/12/2021 Target Resolution Date: 04/11/2021 Goal Status: Active Interventions: Provide education on orientation to the wound center Notes: Pain, Acute or Chronic Nursing Diagnoses: Pain Management - Cyclic Acute (Dressing Change Related) Pain Management - Non-cyclic Acute (Procedural) Pain Management - Non-cyclic Chronic Pain Pain, acute or chronic: actual or potential Potential alteration in comfort, pain Goals: Patient will verbalize adequate pain control and receive pain control interventions during procedures as needed Date Initiated: 04/12/2021 Target Resolution Date: 04/11/2021 Goal Status: Active Patient/caregiver will verbalize adequate pain control between visits MICKAYLA, TROUTEN ([id number]) Date Initiated: 04/12/2021 Target Resolution Date: 04/11/2021 Goal Status: Active Patient/caregiver will verbalize comfort level met Date Initiated: 04/12/2021 Target Resolution Date: 04/11/2021 Goal Status: Active Interventions: Assess comfort goal upon admission Complete pain assessment as per visit requirements Implement pain control  techniques (non-pharmaceutical) Provide education on pain management Provision of support: recognize patient pain, provide comfort and support as needed Reposition patient for comfort Treatment Activities: Administer pain control measures as ordered : 04/12/2021 Notes: Wound/Skin Impairment Nursing Diagnoses: Impaired tissue integrity Knowledge deficit related to ulceration/compromised skin integrity Goals: Patient/caregiver will verbalize understanding of skin care regimen Date Initiated: 04/12/2021 Target Resolution Date: 04/11/2021 Goal Status: Active Ulcer/skin breakdown will have a volume reduction of 30% by week 4 Date Initiated: 04/12/2021 Target Resolution Date: 05/09/2021 Goal Status: Active Ulcer/skin breakdown will have a volume reduction of 50% by week 8 Date Initiated: 04/12/2021 Target Resolution Date: 06/06/2021 Goal Status: Active Ulcer/skin breakdown will have a volume reduction of 80% by week 12 Date Initiated: 04/12/2021 Target Resolution Date: 07/04/2021 Goal Status: Active Ulcer/skin breakdown will heal within 14 weeks Date Initiated: 04/12/2021 Target Resolution Date: 07/18/2021 Goal Status: Active Interventions: Assess patient/caregiver ability to obtain necessary supplies Assess patient/caregiver ability to perform ulcer/skin care regimen upon admission and as needed Assess ulceration(s) every visit Provide education on ulcer and skin care Treatment Activities: Referred to DME  for dressing supplies : 04/12/2021 Skin care regimen initiated : 04/12/2021 Topical wound management initiated : 04/12/2021 Notes: Electronic Signature(s) Signed: 04/12/2021 4:38:18 PM By: Levora Dredge Entered By: Levora Dredge on 04/12/2021 13:26:29  CAITLIN, AINLEY ([id number]) -------------------------------------------------------------------------------- Pain Assessment Details Patient Name: ROGENIA, WERNTZ. Date of Service: 04/12/2021 12:45 PM Medical Record Number:  [id number] Patient Account Number: [id number] Date of Birth/Sex: [birth year] (88 y.o. F) Treating RN: Levora Dredge Primary Care : Ramonita Lab Other Clinician: Referring : Mortimer Fries Treating /Extender: Skipper Cliche in Treatment: 0 Active Problems Location of Pain Severity and Description of Pain Patient Has Paino Yes Site Locations Rate the pain. Current Pain Level: 5 Pain Management and Medication Current Pain Management: Notes buring pain at wound site per pt Electronic Signature(s) Signed: 04/12/2021 4:38:18 PM By: Levora Dredge Entered By: Levora Dredge on 04/12/2021 12:56:30 [name] ([id number]) -------------------------------------------------------------------------------- Patient/Caregiver Education Details Patient Name: GLENOLA, WHEAT Date of Service: 04/12/2021 12:45 PM Medical Record Number: [id number] Patient Account Number: [id number] Date of Birth/Gender: 1926-10-23 (88 y.o. F) Treating RN: Levora Dredge Primary Care Physician: Ramonita Lab Other Clinician: Referring Physician: Mortimer Fries Treating Physician/Extender: Skipper Cliche in Treatment: 0 Education Assessment Education Provided To: Patient Education Topics Provided Welcome To [facility]: Handouts: Welcome To [facility] Methods: Explain/Verbal Wound Debridement: Handouts: Wound Debridement Methods: Explain/Verbal Responses: State content correctly Wound/Skin Impairment: Handouts: Caring for Your Ulcer Methods: Explain/Verbal Responses: State content correctly Electronic Signature(s) Signed: 04/12/2021 4:38:18 PM By: Levora Dredge Entered By: Levora Dredge on 04/12/2021 16:34:14 Altamura, Purvis Kilts ([id number]) -------------------------------------------------------------------------------- Wound Assessment Details Patient Name: MARISAH, LAKER Date of Service: 04/12/2021 12:45 PM Medical Record Number:  [id number] Patient Account Number: [id number] Date of Birth/Sex: [birth year] (88 y.o. F) Treating RN: Levora Dredge Primary Care : Ramonita Lab Other Clinician: Referring : Mortimer Fries Treating /Extender: Skipper Cliche in Treatment: 0 Wound Status Wound Number: 1 Primary Etiology: Trauma, Other Wound Location: Right Lower Leg Wound Status: Open Wounding Event: Trauma Comorbid History: Congestive Heart Failure, Hypertension Date Acquired: 03/07/2021 Weeks Of Treatment: 0 Clustered Wound: No Photos Wound Measurements Length: (cm) 3.8 Width: (cm) 5 Depth: (cm) 0.2 Area: (cm) 14.923 Volume: (cm) 2.985 % Reduction in Area: 0% % Reduction in Volume: 0% Epithelialization: None Tunneling: No Undermining: Yes Starting Position (o'clock): 3 Ending Position (o'clock): 11 Wound Description Classification: Full Thickness Without Exposed Support Structures Wound Margin: Flat and Intact Exudate Amount: Medium Exudate Type: Serosanguineous Exudate Color: red, brown Foul Odor After Cleansing: No Slough/Fibrino Yes Wound Bed Granulation Amount: None Present (0%) Exposed Structure Necrotic Amount: Large (67-100%) Fascia Exposed: No Fat Layer (Subcutaneous Tissue) Exposed: Yes Tendon Exposed: No Muscle Exposed: No Joint Exposed: No Bone Exposed: No Assessment Notes boxed in wound to measure due to odd shape. Skin had folded up under itself Treatment Notes Wound #1 (Lower Leg) Wound Laterality: Right JAVIANA, ANWAR ([id number]) Cleanser [place] Ancillary Kit - 15 Day Supply Discharge Instruction: Use supplies as instructed; Kit contains: (15) Saline Bullets; (15) 3x3 Gauze; 15 pr Gloves Peri-Wound Care Topical Primary Dressing Xeroform-HBD 2x2 (in/in) Discharge Instruction: Apply Xeroform-HBD 2x2 (in/in) as directed Secondary Dressing ABD Pad 5x9 (in/in) Discharge Instruction: Cover with ABD pad Kerlix 4.5 x 4.1 (in/yd) Discharge Instruction:  Apply Kerlix 4.5 x 4.1 (in/yd) as instructed Secured With 46M Medipore H Soft Cloth Surgical Tape, 2x2 (in/yd) Tubigrip Size E, 3.5x10 (in/yds) Discharge Instruction: Apply 3 Tubigrip E 3-finger-widths below knee to base of toes to secure dressing and/or for swelling. Compression Wrap Compression Stockings Add-Ons Electronic Signature(s) Signed: 04/12/2021 4:38:18 PM By: Levora Dredge Entered By: Levora Dredge on 04/12/2021 13:41:33 Scoggins, Purvis Kilts ([id number]) -------------------------------------------------------------------------------- Vitals Details Patient Name: HANAN, MCWILLIAMS  G. Date of Service: 04/12/2021 12:45 PM Medical Record Number: [id number] Patient Account Number: [id number] Date of Birth/Sex: [birth year] (88 y.o. F) Treating RN: Levora Dredge Primary Care : Ramonita Lab Other Clinician: Referring : Mortimer Fries Treating /Extender: Skipper Cliche in Treatment: 0 Vital Signs Time Taken: 12:50 Temperature (F): 97.9 Height (in): 63 Pulse (bpm): 75 Source: Stated Respiratory Rate (breaths/min): 18 Weight (lbs): 130 Blood Pressure (mmHg): 147/84 Source: Stated Reference Range: 80 - 120 mg / dl Body Mass Index (BMI): 23 Electronic Signature(s) Signed: 04/12/2021 4:38:18 PM By: Levora Dredge Entered By: Levora Dredge on 04/12/2021 12:57:12

## 2021-04-12 NOTE — Progress Notes (Signed)
[name], [name] ([id number]) Visit Report for 04/12/2021 Abuse/Suicide Risk Screen Details Patient Name: [name], [name]. Date of Service: 04/12/2021 12:45 PM Medical Record Number: [id number] Patient Account Number: [id number] Date of Birth/Sex: Jan 29, 1927 (88 y.o. F) Treating RN: Levora Dredge Primary Care : Ramonita Lab Other Clinician: Referring : Mortimer Fries Treating /Extender: Skipper Cliche in Treatment: 0 Abuse/Suicide Risk Screen Items Answer ABUSE RISK SCREEN: Has anyone close to you tried to hurt or harm you recentlyo No Do you feel uncomfortable with anyone in your familyo No Has anyone forced you do things that you didnot want to doo No Electronic Signature(s) Signed: 04/12/2021 4:38:18 PM By: Levora Dredge Entered By: Levora Dredge on 04/12/2021 13:05:55 [name] ([id number]) -------------------------------------------------------------------------------- Activities of Daily Living Details Patient Name: [name], [name] Date of Service: 04/12/2021 12:45 PM Medical Record Number: [id number] Patient Account Number: [id number] Date of Birth/Sex: Sep 25, 1926 (88 y.o. F) Treating RN: Levora Dredge Primary Care : Ramonita Lab Other Clinician: Referring : Mortimer Fries Treating /Extender: Skipper Cliche in Treatment: 0 Activities of Daily Living Items Answer Activities of Daily Living (Please select one for each item) Drive Automobile Not Able Take Medications Completely Able Use Telephone Completely Able Care for Appearance Completely Able Use Toilet Completely Able Bath / Shower Completely Able Dress Self Completely Able Feed Self Completely Able Walk Completely Able Get In / Out Bed Completely Able Housework Completely Able Prepare Meals Completely Able Handle Money Completely Able Shop for Self Completely Able Electronic Signature(s) Signed: 04/12/2021 4:38:18 PM By: Levora Dredge Entered By: Levora Dredge on 04/12/2021 13:06:33 [name] ([id number]) -------------------------------------------------------------------------------- Education Screening Details Patient Name: [name], [name] Date of Service: 04/12/2021 12:45 PM Medical Record Number: [id number] Patient Account Number: [id number] Date of Birth/Sex: [birth year] (88 y.o. F) Treating RN: Levora Dredge Primary Care : Ramonita Lab Other Clinician: Referring : Mortimer Fries Treating /Extender: Skipper Cliche in Treatment: 0 Learning Preferences/Education Level/Primary Language Learning Preference: Explanation, Demonstration, Video, Communication Board, Printed Material Highest Education Level: College or Above Preferred Language: English Cognitive Barrier Language Barrier: No Translator Needed: No Memory Deficit: No Emotional Barrier: No Cultural/Religious Beliefs Affecting Medical Care: No Physical Barrier Impaired Vision: Yes Glasses Impaired Hearing: Yes Hearing Aid, bilat Knowledge/Comprehension Knowledge Level: High Comprehension Level: High Ability to understand written instructions: High Ability to understand verbal instructions: High Motivation Anxiety Level: Calm Cooperation: Cooperative Education Importance: Acknowledges Need Interest in Health Problems: Asks Questions Perception: Coherent Willingness to Engage in Self-Management High Activities: Readiness to Engage in Self-Management High Activities: Electronic Signature(s) Signed: 04/12/2021 4:38:18 PM By: Levora Dredge Entered By: Levora Dredge on 04/12/2021 13:07:15 [name], [name] ([id number]) -------------------------------------------------------------------------------- Fall Risk Assessment Details Patient Name: [name] Date of Service: 04/12/2021 12:45 PM Medical Record Number: [id number] Patient Account Number: [id number] Date of Birth/Sex: [birth year] (88  y.o. F) Treating RN: Levora Dredge Primary Care : Ramonita Lab Other Clinician: Referring : Mortimer Fries Treating /Extender: Skipper Cliche in Treatment: 0 Fall Risk Assessment Items Have you had 2 or more falls in the last 12 monthso 0 No Have you had any fall that resulted in injury in the last 12 monthso 0 No FALLS RISK SCREEN History of falling - immediate or within 3 months 0 No Secondary diagnosis (Do you have 2 or more medical diagnoseso) 0 No Ambulatory aid None/bed rest/wheelchair/nurse 0 Yes Crutches/cane/walker 0 No Furniture 0 No Intravenous therapy Access/Saline/Heparin Lock 0 No Gait/Transferring Normal/ bed rest/ wheelchair 0 Yes Weak (short steps with or  without shuffle, stooped but able to lift head while walking, may 0 No seek support from furniture) Impaired (short steps with shuffle, may have difficulty arising from chair, head down, impaired 0 No balance) Mental Status Oriented to own ability 0 Yes Electronic Signature(s) Signed: 04/12/2021 4:38:18 PM By: Levora Dredge Entered By: Levora Dredge on 04/12/2021 13:07:41 [name], [name] ([id number]) -------------------------------------------------------------------------------- Foot Assessment Details Patient Name: [name], [name] Date of Service: 04/12/2021 12:45 PM Medical Record Number: [id number] Patient Account Number: [id number] Date of Birth/Sex: 1926-06-14 (88 y.o. F) Treating RN: Levora Dredge Primary Care : Ramonita Lab Other Clinician: Referring : Mortimer Fries Treating /Extender: Skipper Cliche in Treatment: 0 Foot Assessment Items Site Locations + = Sensation present, - = Sensation absent, C = Callus, U = Ulcer R = Redness, W = Warmth, M = Maceration, PU = Pre-ulcerative lesion F = Fissure, S = Swelling, D = Dryness Assessment Right: Left: Other Deformity: No No Prior Foot Ulcer: No No Prior Amputation: No No Charcot  Joint: No No Ambulatory Status: Ambulatory Without Help Gait: Steady Electronic Signature(s) Signed: 04/12/2021 4:38:18 PM By: Levora Dredge Entered By: Levora Dredge on 04/12/2021 13:16:22 [name], [name] ([id number]) -------------------------------------------------------------------------------- Nutrition Risk Screening Details Patient Name: [name], [name] Date of Service: 04/12/2021 12:45 PM Medical Record Number: [id number] Patient Account Number: [id number] Date of Birth/Sex: [birth year] (88 y.o. F) Treating RN: Levora Dredge Primary Care : Ramonita Lab Other Clinician: Referring : Mortimer Fries Treating /Extender: Skipper Cliche in Treatment: 0 Height (in): 63 Weight (lbs): 130 Body Mass Index (BMI): 23 Nutrition Risk Screening Items Score Screening NUTRITION RISK SCREEN: I have an illness or condition that made me change the kind and/or amount of food I eat 0 No I eat fewer than two meals per day 0 No I eat few fruits and vegetables, or milk products 0 No I have three or more drinks of beer, liquor or wine almost every day 0 No I have tooth or mouth problems that make it hard for me to eat 0 No I don't always have enough money to buy the food I need 0 No I eat alone most of the time 0 No I take three or more different prescribed or over-the-counter drugs a day 0 No Without wanting to, I have lost or gained 10 pounds in the last six months 0 No I am not always physically able to shop, cook and/or feed myself 0 No Nutrition Protocols Good Risk Protocol 0 No interventions needed Moderate Risk Protocol High Risk Proctocol Risk Level: Good Risk Score: 0 Electronic Signature(s) Signed: 04/12/2021 4:38:18 PM By: Levora Dredge Entered By: Levora Dredge on 04/12/2021 13:07:57

## 2021-05-11 ENCOUNTER — Ambulatory Visit: Payer: Medicare Other | Admitting: Internal Medicine

## 2021-05-12 ENCOUNTER — Other Ambulatory Visit: Payer: Self-pay

## 2021-05-12 ENCOUNTER — Encounter: Payer: Medicare Other | Attending: Physician Assistant | Admitting: Physician Assistant

## 2021-05-12 DIAGNOSIS — I5042 Chronic combined systolic (congestive) and diastolic (congestive) heart failure: Secondary | ICD-10-CM | POA: Diagnosis not present

## 2021-05-12 DIAGNOSIS — W2209XA Striking against other stationary object, initial encounter: Secondary | ICD-10-CM | POA: Diagnosis not present

## 2021-05-12 DIAGNOSIS — L97812 Non-pressure chronic ulcer of other part of right lower leg with fat layer exposed: Secondary | ICD-10-CM | POA: Insufficient documentation

## 2021-05-12 DIAGNOSIS — S81811A Laceration without foreign body, right lower leg, initial encounter: Secondary | ICD-10-CM | POA: Diagnosis not present

## 2021-05-12 DIAGNOSIS — I110 Hypertensive heart disease with heart failure: Secondary | ICD-10-CM | POA: Insufficient documentation

## 2021-05-12 DIAGNOSIS — Z7901 Long term (current) use of anticoagulants: Secondary | ICD-10-CM | POA: Insufficient documentation

## 2021-05-12 DIAGNOSIS — I480 Paroxysmal atrial fibrillation: Secondary | ICD-10-CM | POA: Diagnosis not present

## 2021-05-12 NOTE — Progress Notes (Addendum)
LASHELL, MOFFITT ([id number]) Visit Report for 05/12/2021 Chief Complaint Document Details Patient Name: [name], [name]. Date of Service: 05/12/2021 10:30 AM Medical Record Number: [id number] Patient Account Number: [id number] Date of Birth/Sex: 27-Nov-1926 (88 y.o. F) Treating RN: Levora Dredge Primary Care Provider: Ramonita Lab Other Clinician: Referring Provider: Ramonita Lab Treating Provider/Extender: Skipper Cliche in Treatment: 4 Information Obtained from: Patient Chief Complaint Right shin laceration Electronic Signature(s) Signed: 05/12/2021 11:28:37 AM By: Worthy Keeler PA-C Entered By: Worthy Keeler on 05/12/2021 11:28:37 [name], [name] ([id number]) -------------------------------------------------------------------------------- Debridement Details Patient Name: [name] Date of Service: 05/12/2021 10:30 AM Medical Record Number: [id number] Patient Account Number: [id number] Date of Birth/Sex: [birth year] (88 y.o. F) Treating RN: Levora Dredge Primary Care Provider: Ramonita Lab Other Clinician: Referring Provider: Ramonita Lab Treating Provider/Extender: Skipper Cliche in Treatment: 4 Debridement Performed for Wound #1 Right Lower Leg Assessment: Performed By: Physician Tommie Sams., PA-C Debridement Type: Debridement Level of Consciousness (Pre- Awake and Alert procedure): Pre-procedure Verification/Time Out Yes - 11:32 Taken: Total Area Debrided (L x W): 1.3 (cm) x 2.2 (cm) = 2.86 (cm) Tissue and other material Viable, Non-Viable, Slough, Subcutaneous, Biofilm, Slough debrided: Level: Skin/Subcutaneous Tissue Debridement Description: Excisional Instrument: Curette Bleeding: Minimum Hemostasis Achieved: Pressure Response to Treatment: Procedure was tolerated well Level of Consciousness (Post- Awake and Alert procedure): Post Debridement Measurements of Total Wound Length: (cm) 1.3 Width: (cm) 2.2 Depth: (cm) 0.1 Volume: (cm)  0.225 Character of Wound/Ulcer Post Debridement: Stable Post Procedure Diagnosis Same as Pre-procedure Electronic Signature(s) Signed: 05/12/2021 4:45:48 PM By: Levora Dredge Signed: 05/12/2021 5:14:54 PM By: Worthy Keeler PA-C Entered By: Levora Dredge on 05/12/2021 11:33:54 [name] ([id number]) -------------------------------------------------------------------------------- HPI Details Patient Name: [name], [name] Date of Service: 05/12/2021 10:30 AM Medical Record Number: [id number] Patient Account Number: [id number] Date of Birth/Sex: [birth year] (88 y.o. F) Treating RN: Levora Dredge Primary Care Provider: Ramonita Lab Other Clinician: Referring Provider: Ramonita Lab Treating Provider/Extender: Skipper Cliche in Treatment: 4 History of Present Illness HPI Description: 04/12/2021 upon evaluation today patient appears to be doing well in general in regard to her wound all things considered. She is unfortunately experiencing an issue with a fairly significant skin tear to the anterior portion of her leg on the right side Which occurred when she was in [place] December 5. She tells me that she was getting into a cab when she hit it on the Door and subsequently had a significant injury here. She ended up not going anywhere to have this checked at the hospital or urgent care. Subsequently she has been taking care of this for the most part on her own at home until just recently when she saw her primary care provider on this past week where they prescribed doxycycline for her and then placed her in an Unna boot with Telfa pads underneath. Subsequently the patient was advised to keep this on until she saw [place] and she did so. Nonetheless the biggest issue I see is really some of the skin was rolled up underneath and has reattached to some degree this can make it difficult for this to heal in that way we can have to try to see what we can do to straighten this situation  out. The patient does have a history of hypertension, long-term use of anticoagulant therapy, Coumadin, due to atrial fibrillation, congestive heart failure, and again the current wound which we are managing today. 05/12/2021 upon evaluation today patient appears to be  doing also in regard to her wound. This actually been since 1 January that I saw her last. She subsequently following the week I saw her went to visit with her sister at the beach. She has been gone during that time in between and they have been using Xeroform this is significantly smaller. With that being said it does show signs of being a little hyper granulated but nonetheless I think this is not anything that we cannot manage at this point. I discussed that with the patient today as well. Electronic Signature(s) Signed: 05/12/2021 11:43:24 AM By: Worthy Keeler PA-C Entered By: Worthy Keeler on 05/12/2021 11:43:24 [name] ([id number]) -------------------------------------------------------------------------------- Physical Exam Details Patient Name: [name], [name] Date of Service: 05/12/2021 10:30 AM Medical Record Number: [id number] Patient Account Number: [id number] Date of Birth/Sex: [birth year] (88 y.o. F) Treating RN: Levora Dredge Primary Care Provider: Ramonita Lab Other Clinician: Referring Provider: Ramonita Lab Treating Provider/Extender: Jeri Cos Weeks in Treatment: 4 Constitutional Well-nourished and well-hydrated in no acute distress. Respiratory normal breathing without difficulty. Psychiatric this patient is able to make decisions and demonstrates good insight into disease process. Alert and Oriented x 3. pleasant and cooperative. Notes Upon inspection I do not believe the hypergranulation is going require chemical cauterization at this point. I did believe there was some need for sharp debridement to clear away some of the slough and biofilm patient tolerated that today without complication  and postdebridement wound bed appears to be doing significantly better which was great news. Electronic Signature(s) Signed: 05/12/2021 11:44:11 AM By: Worthy Keeler PA-C Entered By: Worthy Keeler on 05/12/2021 11:44:10 [name], [name] ([id number]) -------------------------------------------------------------------------------- Physician Orders Details Patient Name: [name], [name] Date of Service: 05/12/2021 10:30 AM Medical Record Number: [id number] Patient Account Number: [id number] Date of Birth/Sex: 1926/06/02 (88 y.o. F) Treating RN: Levora Dredge Primary Care Provider: Ramonita Lab Other Clinician: Referring Provider: Ramonita Lab Treating Provider/Extender: Skipper Cliche in Treatment: 4 Verbal / Phone Orders: No Diagnosis Coding ICD-10 Coding Code Description [phone] Laceration without foreign body, right lower leg, initial encounter L97.812 Non-pressure chronic ulcer of other part of right lower leg with fat layer exposed I10 Essential (primary) hypertension Z79.01 Long term (current) use of anticoagulants I50.42 Chronic combined systolic (congestive) and diastolic (congestive) heart failure I48.0 Paroxysmal atrial fibrillation Follow-up Appointments o Return Appointment in 1 week. - pt will return after vacation in early February o Nurse Visit as needed [employer] o Wash wounds with antibacterial soap and water. o May shower; gently cleanse wound with antibacterial soap, rinse and pat dry prior to dressing wounds o No tub bath. Wound Treatment Wound #1 - Lower Leg Wound Laterality: Right Cleanser: Byram Ancillary Kit - 15 Day Supply (Generic) 3 x Per Week/30 Days Discharge Instructions: Use supplies as instructed; Kit contains: (15) Saline Bullets; (15) 3x3 Gauze; 15 pr Gloves Primary Dressing: Hydrofera Blue Ready Transfer Foam, 2.5x2.5 (in/in) 3 x Per Week/30 Days Discharge Instructions: Apply Hydrofera Blue Ready to wound bed as  directed Secondary Dressing: ABD Pad 5x9 (in/in) (Generic) 3 x Per Week/30 Days Discharge Instructions: Cover with ABD pad Secondary Dressing: Kerlix 4.5 x 4.1 (in/yd) (Generic) 3 x Per Week/30 Days Discharge Instructions: Apply Kerlix 4.5 x 4.1 (in/yd) as instructed Secured With: 64M Medipore H Soft Cloth Surgical Tape, 2x2 (in/yd) (Generic) 3 x Per Week/30 Days Secured With: Tubigrip Size E, 3.5x10 (in/yds) (Generic) 3 x Per Week/30 Days Discharge Instructions: Apply 3 Tubigrip E 3-finger-widths below knee to  base of toes to secure dressing and/or for swelling. Electronic Signature(s) Signed: 05/12/2021 4:45:48 PM By: Levora Dredge Signed: 05/12/2021 5:14:54 PM By: Worthy Keeler PA-C Entered By: Levora Dredge on 05/12/2021 11:37:41 Arciga, [name] ([id number]) -------------------------------------------------------------------------------- Problem List Details Patient Name: [name], [name] Date of Service: 05/12/2021 10:30 AM Medical Record Number: [id number] Patient Account Number: [id number] Date of Birth/Sex: Aug 16, 1926 (88 y.o. F) Treating RN: Levora Dredge Primary Care Provider: Ramonita Lab Other Clinician: Referring Provider: Ramonita Lab Treating Provider/Extender: Skipper Cliche in Treatment: 4 Active Problems ICD-10 Encounter Code Description Active Date MDM Diagnosis S81.811A Laceration without foreign body, right lower leg, initial encounter 04/12/2021 No Yes L97.812 Non-pressure chronic ulcer of other part of right lower leg with fat layer 04/12/2021 No Yes exposed [place] (primary) hypertension 04/12/2021 No Yes Z79.01 Long term (current) use of anticoagulants 04/12/2021 No Yes I50.42 Chronic combined systolic (congestive) and diastolic (congestive) heart 04/12/2021 No Yes failure I48.0 Paroxysmal atrial fibrillation 04/12/2021 No Yes Inactive Problems Resolved Problems Electronic Signature(s) Signed: 05/12/2021 11:28:26 AM By: Worthy Keeler PA-C Entered  By: Worthy Keeler on 05/12/2021 11:28:26 [name] ([id number]) -------------------------------------------------------------------------------- Progress Note Details Patient Name: [name] Date of Service: 05/12/2021 10:30 AM Medical Record Number: [id number] Patient Account Number: [id number] Date of Birth/Sex: 02-05-1927 (88 y.o. F) Treating RN: Levora Dredge Primary Care Provider: Ramonita Lab Other Clinician: Referring Provider: Ramonita Lab Treating Provider/Extender: Skipper Cliche in Treatment: 4 Subjective Chief Complaint Information obtained from Patient Right shin laceration History of Present Illness (HPI) 04/12/2021 upon evaluation today patient appears to be doing well in general in regard to her wound all things considered. She is unfortunately experiencing an issue with a fairly significant skin tear to the anterior portion of her leg on the right side Which occurred when she was in [place] December 5. She tells me that she was getting into a cab when she hit it on the Door and subsequently had a significant injury here. She ended up not going anywhere to have this checked at the hospital or urgent care. Subsequently she has been taking care of this for the most part on her own at home until just recently when she saw her primary care provider on this past week where they prescribed doxycycline for her and then placed her in an Unna boot with Telfa pads underneath. Subsequently the patient was advised to keep this on until she saw [place] and she did so. Nonetheless the biggest issue I see is really some of the skin was rolled up underneath and has reattached to some degree this can make it difficult for this to heal in that way we can have to try to see what we can do to straighten this situation out. The patient does have a history of hypertension, long-term use of anticoagulant therapy, Coumadin, due to atrial fibrillation, congestive heart failure,  and again the current wound which we are managing today. 05/12/2021 upon evaluation today patient appears to be doing also in regard to her wound. This actually been since 1 January that I saw her last. She subsequently following the week I saw her went to visit with her sister at the beach. She has been gone during that time in between and they have been using Xeroform this is significantly smaller. With that being said it does show signs of being a little hyper granulated but nonetheless I think this is not anything that we cannot manage at this point. I discussed  that with the patient today as well. Objective Constitutional Well-nourished and well-hydrated in no acute distress. Vitals Time Taken: 10:51 AM, Height: 63 in, Weight: 130 lbs, BMI: 23, Temperature: 97.5 F, Pulse: 82 bpm, Respiratory Rate: 18 breaths/min, Blood Pressure: 148/89 mmHg. Respiratory normal breathing without difficulty. Psychiatric this patient is able to make decisions and demonstrates good insight into disease process. Alert and Oriented x 3. pleasant and cooperative. General Notes: Upon inspection I do not believe the hypergranulation is going require chemical cauterization at this point. I did believe there was some need for sharp debridement to clear away some of the slough and biofilm patient tolerated that today without complication and postdebridement wound bed appears to be doing significantly better which was great news. Integumentary (Hair, Skin) Wound #1 status is Open. Original cause of wound was Trauma. The date acquired was: 03/07/2021. The wound has been in treatment 4 weeks. The wound is located on the Right Lower Leg. The wound measures 1.3cm length x 2.2cm width x 0.1cm depth; 2.246cm^2 area and 0.225cm^3 volume. There is Fat Layer (Subcutaneous Tissue) exposed. There is no tunneling or undermining noted. There is a medium amount of serosanguineous drainage noted. The wound margin is flat and intact.  There is medium (34-66%) red, hyper - granulation within the wound bed. There is a medium (34-66%) amount of necrotic tissue within the wound bed including Adherent Slough. Assessment [name], [name] ([id number]) Active Problems ICD-10 Laceration without foreign body, right lower leg, initial encounter Non-pressure chronic ulcer of other part of right lower leg with fat layer exposed Essential (primary) hypertension Long term (current) use of anticoagulants Chronic combined systolic (congestive) and diastolic (congestive) heart failure Paroxysmal atrial fibrillation Procedures Wound #1 Pre-procedure diagnosis of Wound #1 is a Trauma, Other located on the Right Lower Leg . There was a Excisional Skin/Subcutaneous Tissue Debridement with a total area of 2.86 sq cm performed by Tommie Sams., PA-C. With the following instrument(s): Curette to remove Viable and Non-Viable tissue/material. Material removed includes Subcutaneous Tissue, Slough, and Biofilm. No specimens were taken. A time out was conducted at 11:32, prior to the start of the procedure. A Minimum amount of bleeding was controlled with Pressure. The procedure was tolerated well. Post Debridement Measurements: 1.3cm length x 2.2cm width x 0.1cm depth; 0.225cm^3 volume. Character of Wound/Ulcer Post Debridement is stable. Post procedure Diagnosis Wound #1: Same as Pre-Procedure Plan Follow-up Appointments: Return Appointment in 1 week. - pt will return after vacation in early February Nurse Visit as needed Bathing/ Shower/ Hygiene: Wash wounds with antibacterial soap and water. May shower; gently cleanse wound with antibacterial soap, rinse and pat dry prior to dressing wounds No tub bath. WOUND #1: - Lower Leg Wound Laterality: Right Cleanser: Byram Ancillary Kit - 15 Day Supply (Generic) 3 x Per Week/30 Days Discharge Instructions: Use supplies as instructed; Kit contains: (15) Saline Bullets; (15) 3x3 Gauze; 15 pr  Gloves Primary Dressing: Hydrofera Blue Ready Transfer Foam, 2.5x2.5 (in/in) 3 x Per Week/30 Days Discharge Instructions: Apply Hydrofera Blue Ready to wound bed as directed Secondary Dressing: ABD Pad 5x9 (in/in) (Generic) 3 x Per Week/30 Days Discharge Instructions: Cover with ABD pad Secondary Dressing: Kerlix 4.5 x 4.1 (in/yd) (Generic) 3 x Per Week/30 Days Discharge Instructions: Apply Kerlix 4.5 x 4.1 (in/yd) as instructed Secured With: 73M Medipore H Soft Cloth Surgical Tape, 2x2 (in/yd) (Generic) 3 x Per Week/30 Days Secured With: Tubigrip Size E, 3.5x10 (in/yds) (Generic) 3 x Per Week/30 Days Discharge Instructions: Apply 3  Tubigrip E 3-finger-widths below knee to base of toes to secure dressing and/or for swelling. 1. I would recommend currently that we go ahead and initiate treatment with a switch over to [facility] dressing which I think is good to be the best way to go. Patient is in agreement with that plan. 2. We will continue with ABD pad and roll gauze to secure in place. 3. I would also suggest that we continue with the Tubigrip which I think has been beneficial. We will see patient back for reevaluation in 1 week here in the clinic. If anything worsens or changes patient will contact our office for additional recommendations. Electronic Signature(s) Signed: 05/12/2021 11:44:55 AM By: Worthy Keeler PA-C Entered By: Worthy Keeler on 05/12/2021 11:44:54 [name], [name] ([id number]) -------------------------------------------------------------------------------- SuperBill Details Patient Name: [name], [name] Date of Service: 05/12/2021 Medical Record Number: [id number] Patient Account Number: [id number] Date of Birth/Sex: 11/23/1926 (88 y.o. F) Treating RN: Levora Dredge Primary Care Provider: Ramonita Lab Other Clinician: Referring Provider: Ramonita Lab Treating Provider/Extender: Skipper Cliche in Treatment: 4 Diagnosis Coding ICD-10 Codes Code  Description [phone] Laceration without foreign body, right lower leg, initial encounter L97.812 Non-pressure chronic ulcer of other part of right lower leg with fat layer exposed [place] (primary) hypertension Z79.01 Long term (current) use of anticoagulants I50.42 Chronic combined systolic (congestive) and diastolic (congestive) heart failure I48.0 Paroxysmal atrial fibrillation Facility Procedures CPT4 Code: [id number] Description: 46002 - DEB SUBQ TISSUE 20 SQ CM/< Modifier: Quantity: 1 CPT4 Code: Description: ICD-10 Diagnosis Description B84.730 Non-pressure chronic ulcer of other part of right lower leg with fat lay Modifier: er exposed Quantity: Physician Procedures CPT4 Code: [id number] Description: 11042 - WC PHYS SUBQ TISS 20 SQ CM Modifier: Quantity: 1 CPT4 Code: Description: ICD-10 Diagnosis Description C05.259 Non-pressure chronic ulcer of other part of right lower leg with fat lay Modifier: er exposed Quantity: Electronic Signature(s) Signed: 05/12/2021 11:50:11 AM By: Worthy Keeler PA-C Entered By: Worthy Keeler on 05/12/2021 11:50:11

## 2021-05-12 NOTE — Progress Notes (Signed)
Catherine, Hodge ([id number]) Visit Report for 05/12/2021 Arrival Information Details Patient Name: [name], [name]. Date of Service: 05/12/2021 10:30 AM Medical Record Number: [id number] Patient Account Number: [id number] Date of Birth/Sex: 08-05-1926 (88 y.o. F) Treating RN: Levora Dredge Primary Care : Ramonita Lab Other Clinician: Referring : Ramonita Lab Treating /Extender: Skipper Cliche in Treatment: 4 Visit Information History Since Last Visit Added or deleted any medications: No Patient Arrived: Ambulatory Any new allergies or adverse reactions: No Arrival Time: 10:48 Had a fall or experienced change in No Accompanied By: self activities of daily living that may affect Transfer Assistance: None risk of falls: Patient Has Alerts: Yes Hospitalized since last visit: No Patient Alerts: Patient on Blood Thinner Has Dressing in Place as Prescribed: Yes Pain Present Now: No Electronic Signature(s) Signed: 05/12/2021 4:45:48 PM By: Levora Dredge Entered By: Levora Dredge on 05/12/2021 10:50:24 [name] ([id number]) -------------------------------------------------------------------------------- Clinic Level of Care Assessment Details Patient Name: [name] Date of Service: 05/12/2021 10:30 AM Medical Record Number: [id number] Patient Account Number: [id number] Date of Birth/Sex: 04-Mar-1927 (88 y.o. F) Treating RN: Levora Dredge Primary Care : Ramonita Lab Other Clinician: Referring : Ramonita Lab Treating /Extender: Skipper Cliche in Treatment: 4 Clinic Level of Care Assessment Items TOOL 1 Quantity Score []  - Use when EandM and Procedure is performed on INITIAL visit 0 ASSESSMENTS - Nursing Assessment / Reassessment []  - General Physical Exam (combine w/ comprehensive assessment (listed just below) when performed on new 0 pt. evals) []  - 0 Comprehensive Assessment (HX, ROS, Risk Assessments,  Wounds Hx, etc.) ASSESSMENTS - Wound and Skin Assessment / Reassessment []  - Dermatologic / Skin Assessment (not related to wound area) 0 ASSESSMENTS - Ostomy and/or Continence Assessment and Care []  - Incontinence Assessment and Management 0 []  - 0 Ostomy Care Assessment and Management (repouching, etc.) PROCESS - Coordination of Care []  - Simple Patient / Family Education for ongoing care 0 []  - 0 Complex (extensive) Patient / Family Education for ongoing care []  - 0 Staff obtains Programmer, systems, Records, Test Results / Process Orders []  - 0 Staff telephones HHA, Nursing Homes / Clarify orders / etc []  - 0 Routine Transfer to another Facility (non-emergent condition) []  - 0 Routine Hospital Admission (non-emergent condition) []  - 0 New Admissions / Biomedical engineer / Ordering NPWT, Apligraf, etc. []  - 0 Emergency Hospital Admission (emergent condition) PROCESS - Special Needs []  - Pediatric / Minor Patient Management 0 []  - 0 Isolation Patient Management []  - 0 Hearing / Language / Visual special needs []  - 0 Assessment of Community assistance (transportation, D/C planning, etc.) []  - 0 Additional assistance / Altered mentation []  - 0 Support Surface(s) Assessment (bed, cushion, seat, etc.) INTERVENTIONS - Miscellaneous []  - External ear exam 0 []  - 0 Patient Transfer (multiple staff / Civil Service fast streamer / Similar devices) []  - 0 Simple Staple / Suture removal (25 or less) []  - 0 Complex Staple / Suture removal (26 or more) []  - 0 Hypo/Hyperglycemic Management (do not check if billed separately) []  - 0 Ankle / Brachial Index (ABI) - do not check if billed separately Has the patient been seen at the hospital within the last three years: Yes Total Score: 0 Level Of Care: ____ [name] ([id number]) Electronic Signature(s) Signed: 05/12/2021 4:45:48 PM By: Levora Dredge Entered By: Levora Dredge on 05/12/2021 11:40:56 Aguilar, Purvis Kilts  ([id number]) -------------------------------------------------------------------------------- Encounter Discharge Information Details Patient Name: Catherine, Hodge Date of Service: 05/12/2021 10:30 AM Medical Record  Number: [id number] Patient Account Number: [id number] Date of Birth/Sex: [birth year] (88 y.o. F) Treating RN: Levora Dredge Primary Care : Ramonita Lab Other Clinician: Referring : Ramonita Lab Treating /Extender: Skipper Cliche in Treatment: 4 Encounter Discharge Information Items Post Procedure Vitals Discharge Condition: Stable Temperature (F): 97.5 Ambulatory Status: Ambulatory Pulse (bpm): 82 Discharge Destination: Home Respiratory Rate (breaths/min): 18 Transportation: Private Auto Blood Pressure (mmHg): 148/89 Accompanied By: self Schedule Follow-up Appointment: Yes Clinical Summary of Care: Electronic Signature(s) Signed: 05/12/2021 11:54:47 AM By: Levora Dredge Entered By: Levora Dredge on 05/12/2021 11:54:47 Syler, Purvis Kilts ([id number]) -------------------------------------------------------------------------------- Lower Extremity Assessment Details Patient Name: Catherine, Hodge Date of Service: 05/12/2021 10:30 AM Medical Record Number: [id number] Patient Account Number: [id number] Date of Birth/Sex: 1926-07-28 (88 y.o. F) Treating RN: Levora Dredge Primary Care : Ramonita Lab Other Clinician: Referring : Ramonita Lab Treating /Extender: Jeri Cos Weeks in Treatment: 4 Edema Assessment Assessed: [Left: No] [Right: No] Edema: [Left: N] [Right: o] Calf Left: Right: Point of Measurement: 30 cm From Medial Instep 31.5 cm Ankle Left: Right: Point of Measurement: 10 cm From Medial Instep 21.5 cm Vascular Assessment Pulses: Dorsalis Pedis Palpable: [Right:Yes] Electronic Signature(s) Signed: 05/12/2021 4:45:48 PM By: Levora Dredge Entered By: Levora Dredge on 05/12/2021 11:00:14 [name] ([id number]) -------------------------------------------------------------------------------- Multi Wound Chart Details Patient Name: [name] Date of Service: 05/12/2021 10:30 AM Medical Record Number: [id number] Patient Account Number: [id number] Date of Birth/Sex: Apr 26, 1926 (88 y.o. F) Treating RN: Levora Dredge Primary Care : Ramonita Lab Other Clinician: Referring : Ramonita Lab Treating /Extender: Skipper Cliche in Treatment: 4 Vital Signs Height(in): 63 Pulse(bpm): 82 Weight(lbs): 130 Blood Pressure(mmHg): 148/89 Body Mass Index(BMI): 23 Temperature(F): 97.5 Respiratory Rate(breaths/min): 18 Photos: [N/A:N/A] Wound Location: Right Lower Leg N/A N/A Wounding Event: Trauma N/A N/A Primary Etiology: Trauma, Other N/A N/A Comorbid History: Congestive Heart Failure, N/A N/A Hypertension Date Acquired: 03/07/2021 N/A N/A Weeks of Treatment: 4 N/A N/A Wound Status: Open N/A N/A Wound Recurrence: No N/A N/A Measurements L x W x D (cm) 1.3x2.2x0.1 N/A N/A Area (cm) : 2.246 N/A N/A Volume (cm) : 0.225 N/A N/A % Reduction in Area: 84.90% N/A N/A % Reduction in Volume: 92.50% N/A N/A Classification: Full Thickness Without Exposed N/A N/A Support Structures Exudate Amount: Medium N/A N/A Exudate Type: Serosanguineous N/A N/A Exudate Color: red, brown N/A N/A Wound Margin: Flat and Intact N/A N/A Granulation Amount: Medium (34-66%) N/A N/A Granulation Quality: Red, Hyper-granulation N/A N/A Necrotic Amount: Medium (34-66%) N/A N/A Exposed Structures: Fat Layer (Subcutaneous Tissue): N/A N/A Yes Fascia: No Tendon: No Muscle: No Joint: No Bone: No Epithelialization: Small (1-33%) N/A N/A Treatment Notes Electronic Signature(s) Signed: 05/12/2021 4:45:48 PM By: Levora Dredge Entered By: Levora Dredge on 05/12/2021 11:32:47 [name]  ([id number]) -------------------------------------------------------------------------------- Multi-Disciplinary Care Plan Details Patient Name: NEDRA, MCINNIS Date of Service: 05/12/2021 10:30 AM Medical Record Number: [id number] Patient Account Number: [id number] Date of Birth/Sex: [birth year] (88 y.o. F) Treating RN: Levora Dredge Primary Care : Ramonita Lab Other Clinician: Referring : Ramonita Lab Treating /Extender: Skipper Cliche in Treatment: 4 Active Inactive Necrotic Tissue Nursing Diagnoses: Impaired tissue integrity related to necrotic/devitalized tissue Knowledge deficit related to management of necrotic/devitalized tissue Goals: Necrotic/devitalized tissue will be minimized in the wound bed Date Initiated: 04/12/2021 Target Resolution Date: 05/09/2021 Goal Status: Active Patient/caregiver will verbalize understanding of reason and process for debridement of necrotic tissue Date Initiated: 04/12/2021 Target Resolution Date: 05/09/2021 Goal Status: Active Interventions: Assess patient pain level pre-,  during and post procedure and prior to discharge Provide education on necrotic tissue and debridement process Treatment Activities: Apply topical anesthetic as ordered : 04/12/2021 Excisional debridement : 04/12/2021 Notes: Orientation to the Wound Care Program Nursing Diagnoses: Knowledge deficit related to the wound healing center program Goals: Patient/caregiver will verbalize understanding of the [facility] Date Initiated: 04/12/2021 Target Resolution Date: 04/11/2021 Goal Status: Active Interventions: Provide education on orientation to the wound center Notes: Pain, Acute or Chronic Nursing Diagnoses: Pain Management - Cyclic Acute (Dressing Change Related) Pain Management - Non-cyclic Acute (Procedural) Pain Management - Non-cyclic Chronic Pain Pain, acute or chronic: actual or potential Potential alteration in comfort,  pain Goals: Patient will verbalize adequate pain control and receive pain control interventions during procedures as needed Date Initiated: 04/12/2021 Target Resolution Date: 04/11/2021 Goal Status: Active Patient/caregiver will verbalize adequate pain control between visits GEORGIANA, SPILLANE ([id number]) Date Initiated: 04/12/2021 Target Resolution Date: 04/11/2021 Goal Status: Active Patient/caregiver will verbalize comfort level met Date Initiated: 04/12/2021 Target Resolution Date: 04/11/2021 Goal Status: Active Interventions: Assess comfort goal upon admission Complete pain assessment as per visit requirements Implement pain control techniques (non-pharmaceutical) Provide education on pain management Provision of support: recognize patient pain, provide comfort and support as needed Reposition patient for comfort Treatment Activities: Administer pain control measures as ordered : 04/12/2021 Notes: Wound/Skin Impairment Nursing Diagnoses: Impaired tissue integrity Knowledge deficit related to ulceration/compromised skin integrity Goals: Patient/caregiver will verbalize understanding of skin care regimen Date Initiated: 04/12/2021 Target Resolution Date: 04/11/2021 Goal Status: Active Ulcer/skin breakdown will have a volume reduction of 30% by week 4 Date Initiated: 04/12/2021 Target Resolution Date: 05/09/2021 Goal Status: Active Ulcer/skin breakdown will have a volume reduction of 50% by week 8 Date Initiated: 04/12/2021 Target Resolution Date: 06/06/2021 Goal Status: Active Ulcer/skin breakdown will have a volume reduction of 80% by week 12 Date Initiated: 04/12/2021 Target Resolution Date: 07/04/2021 Goal Status: Active Ulcer/skin breakdown will heal within 14 weeks Date Initiated: 04/12/2021 Target Resolution Date: 07/18/2021 Goal Status: Active Interventions: Assess patient/caregiver ability to obtain necessary supplies Assess patient/caregiver ability to perform ulcer/skin care  regimen upon admission and as needed Assess ulceration(s) every visit Provide education on ulcer and skin care Treatment Activities: Referred to DME  for dressing supplies : 04/12/2021 Skin care regimen initiated : 04/12/2021 Topical wound management initiated : 04/12/2021 Notes: Electronic Signature(s) Signed: 05/12/2021 4:45:48 PM By: Levora Dredge Entered By: Levora Dredge on 05/12/2021 11:32:07 [name] ([id number]) -------------------------------------------------------------------------------- Pain Assessment Details Patient Name: SALWA, BAI Date of Service: 05/12/2021 10:30 AM Medical Record Number: [id number] Patient Account Number: [id number] Date of Birth/Sex: 09-20-1926 (88 y.o. F) Treating RN: Levora Dredge Primary Care : Ramonita Lab Other Clinician: Referring : Ramonita Lab Treating /Extender: Skipper Cliche in Treatment: 4 Active Problems Location of Pain Severity and Description of Pain Patient Has Paino No Site Locations Rate the pain. Current Pain Level: 0 Pain Management and Medication Current Pain Management: Electronic Signature(s) Signed: 05/12/2021 4:45:48 PM By: Levora Dredge Entered By: Levora Dredge on 05/12/2021 10:52:18 RYLAH, FUKUDA ([id number]) -------------------------------------------------------------------------------- Patient/Caregiver Education Details Patient Name: REANNA, SCOGGIN Date of Service: 05/12/2021 10:30 AM Medical Record Number: [id number] Patient Account Number: [id number] Date of Birth/Gender: 1926-10-06 (88 y.o. F) Treating RN: Levora Dredge Primary Care Physician: Ramonita Lab Other Clinician: Referring Physician: Ramonita Lab Treating Physician/Extender: Skipper Cliche in Treatment: 4 Education Assessment Education Provided To: Patient Education Topics Provided Welcome To [facility]: Handouts: Welcome To The [facility] Methods:  Explain/Verbal Responses: State content correctly Wound Debridement: Handouts: Wound Debridement Methods: Explain/Verbal Responses: State content correctly Wound/Skin Impairment: Handouts: Caring for Your Ulcer Methods: Explain/Verbal Responses: State content correctly Electronic Signature(s) Signed: 05/12/2021 4:45:48 PM By: Levora Dredge Entered By: Levora Dredge on 05/12/2021 11:49:43 Winberg, Purvis Kilts ([id number]) -------------------------------------------------------------------------------- Wound Assessment Details Patient Name: SKYLAN, GIFT Date of Service: 05/12/2021 10:30 AM Medical Record Number: [id number] Patient Account Number: [id number] Date of Birth/Sex: 1926/04/05 (88 y.o. F) Treating RN: Levora Dredge Primary Care : Ramonita Lab Other Clinician: Referring : Ramonita Lab Treating /Extender: Jeri Cos Weeks in Treatment: 4 Wound Status Wound Number: 1 Primary Etiology: Trauma, Other Wound Location: Right Lower Leg Wound Status: Open Wounding Event: Trauma Comorbid History: Congestive Heart Failure, Hypertension Date Acquired: 03/07/2021 Weeks Of Treatment: 4 Clustered Wound: No Photos Wound Measurements Length: (cm) 1.3 Width: (cm) 2.2 Depth: (cm) 0.1 Area: (cm) 2.246 Volume: (cm) 0.225 % Reduction in Area: 84.9% % Reduction in Volume: 92.5% Epithelialization: Small (1-33%) Tunneling: No Undermining: No Wound Description Classification: Full Thickness Without Exposed Support Structures Wound Margin: Flat and Intact Exudate Amount: Medium Exudate Type: Serosanguineous Exudate Color: red, brown Foul Odor After Cleansing: No Slough/Fibrino Yes Wound Bed Granulation Amount: Medium (34-66%) Exposed Structure Granulation Quality: Red, Hyper-granulation Fascia Exposed: No Necrotic Amount: Medium (34-66%) Fat Layer (Subcutaneous Tissue) Exposed: Yes Necrotic Quality: Adherent Slough Tendon Exposed: No Muscle  Exposed: No Joint Exposed: No Bone Exposed: No Treatment Notes Wound #1 (Lower Leg) Wound Laterality: Right Cleanser [place] Ancillary Kit - 15 Day Supply Discharge Instruction: Use supplies as instructed; Kit contains: (15) Saline Bullets; (15) 3x3 Gauze; 15 pr Gloves Peri-Wound Care DAJANE, VALLI ([id number]) Topical Primary Dressing Hydrofera Blue Ready Transfer Foam, 2.5x2.5 (in/in) Discharge Instruction: Apply Hydrofera Blue Ready to wound bed as directed Secondary Dressing ABD Pad 5x9 (in/in) Discharge Instruction: Cover with ABD pad Kerlix 4.5 x 4.1 (in/yd) Discharge Instruction: Apply Kerlix 4.5 x 4.1 (in/yd) as instructed Secured With 32M Medipore H Soft Cloth Surgical Tape, 2x2 (in/yd) Tubigrip Size E, 3.5x10 (in/yds) Discharge Instruction: Apply 3 Tubigrip E 3-finger-widths below knee to base of toes to secure dressing and/or for swelling. Compression Wrap Compression Stockings Add-Ons Electronic Signature(s) Signed: 05/12/2021 4:45:48 PM By: Levora Dredge Entered By: Levora Dredge on 05/12/2021 10:59:30 SHAMEIKA, SPEELMAN ([id number]) -------------------------------------------------------------------------------- Vitals Details Patient Name: AME, HEAGLE Date of Service: 05/12/2021 10:30 AM Medical Record Number: [id number] Patient Account Number: [id number] Date of Birth/Sex: 1927-01-12 (88 y.o. F) Treating RN: Levora Dredge Primary Care : Ramonita Lab Other Clinician: Referring : Ramonita Lab Treating /Extender: Skipper Cliche in Treatment: 4 Vital Signs Time Taken: 10:51 Temperature (F): 97.5 Height (in): 63 Pulse (bpm): 82 Weight (lbs): 130 Respiratory Rate (breaths/min): 18 Body Mass Index (BMI): 23 Blood Pressure (mmHg): 148/89 Reference Range: 80 - 120 mg / dl Electronic Signature(s) Signed: 05/12/2021 4:45:48 PM By: Levora Dredge Entered By: Levora Dredge on 05/12/2021 10:52:00

## 2021-05-26 ENCOUNTER — Ambulatory Visit: Payer: Medicare Other | Admitting: Physician Assistant

## 2021-05-31 ENCOUNTER — Other Ambulatory Visit: Payer: Self-pay

## 2021-05-31 ENCOUNTER — Encounter: Payer: Medicare Other | Admitting: Physician Assistant

## 2021-05-31 DIAGNOSIS — S81811A Laceration without foreign body, right lower leg, initial encounter: Secondary | ICD-10-CM | POA: Diagnosis not present

## 2021-05-31 NOTE — Progress Notes (Addendum)
MIKELL, CAMP ([id number]) Visit Report for 05/31/2021 Arrival Information Details Patient Name: [name], [name]. Date of Service: 05/31/2021 10:15 AM Medical Record Number: [id number] Patient Account Number: [id number] Date of Birth/Sex: Aug 08, 1926 (88 y.o. F) Treating RN: Carlene Coria Primary Care : Ramonita Lab Other Clinician: Referring : Ramonita Lab Treating /Extender: Skipper Cliche in Treatment: 7 Visit Information History Since Last Visit All ordered tests and consults were completed: No Patient Arrived: Ambulatory Added or deleted any medications: No Arrival Time: 10:12 Any new allergies or adverse reactions: No Accompanied By: self Had a fall or experienced change in No Transfer Assistance: None activities of daily living that may affect Patient Identification Verified: Yes risk of falls: Secondary Verification Process Completed: Yes Signs or symptoms of abuse/neglect since last visito No Patient Requires Transmission-Based No Hospitalized since last visit: No Precautions: Implantable device outside of the clinic excluding No Patient Has Alerts: Yes cellular tissue based products placed in the center Patient Alerts: Patient on Blood since last visit: Thinner Has Dressing in Place as Prescribed: Yes Pain Present Now: No Electronic Signature(s) Signed: 05/31/2021 4:37:51 PM By: Carlene Coria RN Entered By: Carlene Coria on 05/31/2021 10:18:44 Keesey, Purvis Kilts ([id number]) -------------------------------------------------------------------------------- Clinic Level of Care Assessment Details Patient Name: [name], [name] Date of Service: 05/31/2021 10:15 AM Medical Record Number: [id number] Patient Account Number: [id number] Date of Birth/Sex: [birth year] (88 y.o. F) Treating RN: Carlene Coria Primary Care : Ramonita Lab Other Clinician: Referring : Ramonita Lab Treating /Extender: Skipper Cliche in  Treatment: 7 Clinic Level of Care Assessment Items TOOL 1 Quantity Score []  - Use when EandM and Procedure is performed on INITIAL visit 0 ASSESSMENTS - Nursing Assessment / Reassessment []  - General Physical Exam (combine w/ comprehensive assessment (listed just below) when performed on new 0 pt. evals) []  - 0 Comprehensive Assessment (HX, ROS, Risk Assessments, Wounds Hx, etc.) ASSESSMENTS - Wound and Skin Assessment / Reassessment []  - Dermatologic / Skin Assessment (not related to wound area) 0 ASSESSMENTS - Ostomy and/or Continence Assessment and Care []  - Incontinence Assessment and Management 0 []  - 0 Ostomy Care Assessment and Management (repouching, etc.) PROCESS - Coordination of Care []  - Simple Patient / Family Education for ongoing care 0 []  - 0 Complex (extensive) Patient / Family Education for ongoing care []  - 0 Staff obtains Programmer, systems, Records, Test Results / Process Orders []  - 0 Staff telephones HHA, Nursing Homes / Clarify orders / etc []  - 0 Routine Transfer to another Facility (non-emergent condition) []  - 0 Routine Hospital Admission (non-emergent condition) []  - 0 New Admissions / Biomedical engineer / Ordering NPWT, Apligraf, etc. []  - 0 Emergency Hospital Admission (emergent condition) PROCESS - Special Needs []  - Pediatric / Minor Patient Management 0 []  - 0 Isolation Patient Management []  - 0 Hearing / Language / Visual special needs []  - 0 Assessment of Community assistance (transportation, D/C planning, etc.) []  - 0 Additional assistance / Altered mentation []  - 0 Support Surface(s) Assessment (bed, cushion, seat, etc.) INTERVENTIONS - Miscellaneous []  - External ear exam 0 []  - 0 Patient Transfer (multiple staff / Civil Service fast streamer / Similar devices) []  - 0 Simple Staple / Suture removal (25 or less) []  - 0 Complex Staple / Suture removal (26 or more) []  - 0 Hypo/Hyperglycemic Management (do not check if billed separately) []  -  0 Ankle / Brachial Index (ABI) - do not check if billed separately Has the patient been seen at the hospital within the last three  years: Yes Total Score: 0 Level Of Care: ____ [name] ([id number]) Electronic Signature(s) Signed: 05/31/2021 4:37:51 PM By: Carlene Coria RN Entered By: Carlene Coria on 05/31/2021 10:34:57 LAMIYA, NAAS ([id number]) -------------------------------------------------------------------------------- Encounter Discharge Information Details Patient Name: [name], [name] Date of Service: 05/31/2021 10:15 AM Medical Record Number: [id number] Patient Account Number: [id number] Date of Birth/Sex: Apr 20, 1926 (88 y.o. F) Treating RN: Carlene Coria Primary Care : Ramonita Lab Other Clinician: Referring : Ramonita Lab Treating /Extender: Skipper Cliche in Treatment: 7 Encounter Discharge Information Items Post Procedure Vitals Discharge Condition: Stable Temperature (F): 98 Ambulatory Status: Ambulatory Pulse (bpm): 99 Discharge Destination: Home Respiratory Rate (breaths/min): 18 Transportation: Private Auto Blood Pressure (mmHg): 146/89 Accompanied By: self Schedule Follow-up Appointment: Yes Clinical Summary of Care: Patient Declined Electronic Signature(s) Signed: 05/31/2021 4:37:51 PM By: Carlene Coria RN Entered By: Carlene Coria on 05/31/2021 10:36:08 [name] ([id number]) -------------------------------------------------------------------------------- Lower Extremity Assessment Details Patient Name: [name], [name] Date of Service: 05/31/2021 10:15 AM Medical Record Number: [id number] Patient Account Number: [id number] Date of Birth/Sex: 12-24-1926 (88 y.o. F) Treating RN: Carlene Coria Primary Care : Ramonita Lab Other Clinician: Referring : Ramonita Lab Treating /Extender: Jeri Cos Weeks in Treatment: 7 Edema Assessment Assessed: [Left: No] [Right: No] Edema: [Left: N]  [Right: o] Calf Left: Right: Point of Measurement: 30 cm From Medial Instep 32 cm Ankle Left: Right: Point of Measurement: 10 cm From Medial Instep 21 cm Vascular Assessment Pulses: Dorsalis Pedis Palpable: [Right:Yes] Electronic Signature(s) Signed: 05/31/2021 4:37:51 PM By: Carlene Coria RN Entered By: Carlene Coria on 05/31/2021 10:26:10 [name] ([id number]) -------------------------------------------------------------------------------- Multi Wound Chart Details Patient Name: [name] Date of Service: 05/31/2021 10:15 AM Medical Record Number: [id number] Patient Account Number: [id number] Date of Birth/Sex: 1927-01-24 (88 y.o. F) Treating RN: Carlene Coria Primary Care : Ramonita Lab Other Clinician: Referring : Ramonita Lab Treating /Extender: Skipper Cliche in Treatment: 7 Vital Signs Height(in): 63 Pulse(bpm): 99 Weight(lbs): 130 Blood Pressure(mmHg): 146/89 Body Mass Index(BMI): 23 Temperature(F): 98 Respiratory Rate(breaths/min): 18 Photos: [N/A:N/A] Wound Location: Right Lower Leg N/A N/A Wounding Event: Trauma N/A N/A Primary Etiology: Trauma, Other N/A N/A Comorbid History: Congestive Heart Failure, N/A N/A Hypertension Date Acquired: 03/07/2021 N/A N/A Weeks of Treatment: 7 N/A N/A Wound Status: Open N/A N/A Wound Recurrence: No N/A N/A Measurements L x W x D (cm) 1x0.7x0.1 N/A N/A Area (cm) : 0.55 N/A N/A Volume (cm) : 0.055 N/A N/A % Reduction in Area: 96.30% N/A N/A % Reduction in Volume: 98.20% N/A N/A Classification: Full Thickness Without Exposed N/A N/A Support Structures Exudate Amount: Medium N/A N/A Exudate Type: Serosanguineous N/A N/A Exudate Color: red, brown N/A N/A Wound Margin: Flat and Intact N/A N/A Granulation Amount: Medium (34-66%) N/A N/A Granulation Quality: Red, Hyper-granulation N/A N/A Necrotic Amount: Medium (34-66%) N/A N/A Exposed Structures: Fat Layer (Subcutaneous Tissue):  N/A N/A Yes Fascia: No Tendon: No Muscle: No Joint: No Bone: No Epithelialization: Small (1-33%) N/A N/A Treatment Notes Electronic Signature(s) Signed: 05/31/2021 4:37:51 PM By: Carlene Coria RN Entered By: Carlene Coria on 05/31/2021 10:26:30 [name] ([id number]) -------------------------------------------------------------------------------- [place] Details Patient Name: [name], [name] Date of Service: 05/31/2021 10:15 AM Medical Record Number: [id number] Patient Account Number: [id number] Date of Birth/Sex: 18-Feb-1927 (88 y.o. F) Treating RN: Carlene Coria Primary Care : Ramonita Lab Other Clinician: Referring : Ramonita Lab Treating /Extender: Skipper Cliche in Treatment: 7 Active Inactive Electronic Signature(s) Signed: 07/18/2021 5:08:04 PM By: Gretta Cool, BSN, RN, CWS,  Maudie Mercury RN, BSN Signed: 12/19/2021 2:34:58 PM By: Carlene Coria RN Previous Signature: 05/31/2021 4:37:51 PM Version By: Carlene Coria RN Entered By: Gretta Cool BSN, RN, CWS, Kim on 07/18/2021 17:08:04 DARNISE, MONTAG ([id number]) -------------------------------------------------------------------------------- Pain Assessment Details Patient Name: [name], [name] Date of Service: 05/31/2021 10:15 AM Medical Record Number: [id number] Patient Account Number: [id number] Date of Birth/Sex: 1926/07/08 (88 y.o. F) Treating RN: Carlene Coria Primary Care : Ramonita Lab Other Clinician: Referring : Ramonita Lab Treating /Extender: Skipper Cliche in Treatment: 7 Active Problems Location of Pain Severity and Description of Pain Patient Has Paino No Site Locations Pain Management and Medication Current Pain Management: Electronic Signature(s) Signed: 05/31/2021 4:37:51 PM By: Carlene Coria RN Entered By: Carlene Coria on 05/31/2021 10:19:12 [name]  ([id number]) -------------------------------------------------------------------------------- Patient/Caregiver Education Details Patient Name: [name], [name] Date of Service: 05/31/2021 10:15 AM Medical Record Number: [id number] Patient Account Number: [id number] Date of Birth/Gender: 1926/10/06 (88 y.o. F) Treating RN: Carlene Coria Primary Care Physician: Ramonita Lab Other Clinician: Referring Physician: Ramonita Lab Treating Physician/Extender: Skipper Cliche in Treatment: 7 Education Assessment Education Provided To: Patient Education Topics Provided Pain: Methods: Explain/Verbal Responses: State content correctly Welcome To [facility]: Methods: Explain/Verbal Responses: State content correctly Electronic Signature(s) Signed: 05/31/2021 4:37:51 PM By: Carlene Coria RN Entered By: Carlene Coria on 05/31/2021 10:35:19 [name] ([id number]) -------------------------------------------------------------------------------- Wound Assessment Details Patient Name: [name], [name] Date of Service: 05/31/2021 10:15 AM Medical Record Number: [id number] Patient Account Number: [id number] Date of Birth/Sex: Jun 03, 1926 (88 y.o. F) Treating RN: Carlene Coria Primary Care : Ramonita Lab Other Clinician: Referring : Ramonita Lab Treating /Extender: Jeri Cos Weeks in Treatment: 7 Wound Status Wound Number: 1 Primary Etiology: Trauma, Other Wound Location: Right Lower Leg Wound Status: Open Wounding Event: Trauma Comorbid History: Congestive Heart Failure, Hypertension Date Acquired: 03/07/2021 Weeks Of Treatment: 7 Clustered Wound: No Photos Wound Measurements Length: (cm) 1 Width: (cm) 0.7 Depth: (cm) 0.1 Area: (cm) 0.55 Volume: (cm) 0.055 % Reduction in Area: 96.3% % Reduction in Volume: 98.2% Epithelialization: Small (1-33%) Tunneling: No Undermining: No Wound Description Classification: Full Thickness Without Exposed  Support Structu Wound Margin: Flat and Intact Exudate Amount: Medium Exudate Type: Serosanguineous Exudate Color: red, brown res Foul Odor After Cleansing: No Slough/Fibrino Yes Wound Bed Granulation Amount: Medium (34-66%) Exposed Structure Granulation Quality: Red, Hyper-granulation Fascia Exposed: No Necrotic Amount: Medium (34-66%) Fat Layer (Subcutaneous Tissue) Exposed: Yes Necrotic Quality: Adherent Slough Tendon Exposed: No Muscle Exposed: No Joint Exposed: No Bone Exposed: No Electronic Signature(s) Signed: 05/31/2021 4:37:51 PM By: Carlene Coria RN Entered By: Carlene Coria on 05/31/2021 10:25:41 [place], Purvis Kilts ([id number]) -------------------------------------------------------------------------------- Vitals Details Patient Name: [name] Date of Service: 05/31/2021 10:15 AM Medical Record Number: [id number] Patient Account Number: [id number] Date of Birth/Sex: [birth year] (88 y.o. F) Treating RN: Carlene Coria Primary Care : Ramonita Lab Other Clinician: Referring : Ramonita Lab Treating /Extender: Skipper Cliche in Treatment: 7 Vital Signs Time Taken: 10:18 Temperature (F): 98 Height (in): 63 Pulse (bpm): 99 Weight (lbs): 130 Respiratory Rate (breaths/min): 18 Body Mass Index (BMI): 23 Blood Pressure (mmHg): 146/89 Reference Range: 80 - 120 mg / dl Electronic Signature(s) Signed: 05/31/2021 4:37:51 PM By: Carlene Coria RN Entered By: Carlene Coria on 05/31/2021 10:19:03

## 2021-05-31 NOTE — Progress Notes (Addendum)
DEVLYNN, KNOFF ([id number]) Visit Report for 05/31/2021 Chief Complaint Document Details Patient Name: [name], [name]. Date of Service: 05/31/2021 10:15 AM Medical Record Number: [id number] Patient Account Number: [id number] Date of Birth/Sex: 09/16/1926 (88 y.o. F) Treating RN: Carlene Coria Primary Care Provider: Ramonita Lab Other Clinician: Referring Provider: Ramonita Lab Treating Provider/Extender: Skipper Cliche in Treatment: 7 Information Obtained from: Patient Chief Complaint Right shin laceration Electronic Signature(s) Signed: 05/31/2021 10:21:09 AM By: Worthy Keeler PA-C Entered By: Worthy Keeler on 05/31/2021 10:21:09 RILY, NICKEY ([id number]) -------------------------------------------------------------------------------- Debridement Details Patient Name: [name], [name] Date of Service: 05/31/2021 10:15 AM Medical Record Number: [id number] Patient Account Number: [id number] Date of Birth/Sex: 1927/01/25 (88 y.o. F) Treating RN: Carlene Coria Primary Care Provider: Ramonita Lab Other Clinician: Referring Provider: Ramonita Lab Treating Provider/Extender: Skipper Cliche in Treatment: 7 Debridement Performed for Wound #1 Right Lower Leg Assessment: Performed By: Physician Tommie Sams., PA-C Debridement Type: Debridement Level of Consciousness (Pre- Awake and Alert procedure): Pre-procedure Verification/Time Out Yes - 10:30 Taken: Start Time: 10:30 Pain Control: Lidocaine 4% Topical Solution Total Area Debrided (L x W): 1 (cm) x 0.7 (cm) = 0.7 (cm) Tissue and other material Viable, Slough, Subcutaneous, Slough debrided: Level: Skin/Subcutaneous Tissue Debridement Description: Excisional Instrument: Curette Bleeding: Minimum Hemostasis Achieved: Pressure End Time: 10:33 Procedural Pain: 0 Post Procedural Pain: 0 Response to Treatment: Procedure was tolerated well Level of Consciousness (Post- Awake and Alert procedure): Post  Debridement Measurements of Total Wound Length: (cm) 1 Width: (cm) 0.7 Depth: (cm) 0.1 Volume: (cm) 0.055 Character of Wound/Ulcer Post Debridement: Improved Post Procedure Diagnosis Same as Pre-procedure Electronic Signature(s) Signed: 05/31/2021 4:37:47 PM By: Worthy Keeler PA-C Signed: 05/31/2021 4:37:51 PM By: Carlene Coria RN Entered By: Carlene Coria on 05/31/2021 10:33:26 CERIA, SUMINSKI ([id number]) -------------------------------------------------------------------------------- HPI Details Patient Name: [name], [name] Date of Service: 05/31/2021 10:15 AM Medical Record Number: [id number] Patient Account Number: [id number] Date of Birth/Sex: [birth year] (88 y.o. F) Treating RN: Carlene Coria Primary Care Provider: Ramonita Lab Other Clinician: Referring Provider: Ramonita Lab Treating Provider/Extender: Skipper Cliche in Treatment: 7 History of Present Illness HPI Description: 04/12/2021 upon evaluation today patient appears to be doing well in general in regard to her wound all things considered. She is unfortunately experiencing an issue with a fairly significant skin tear to the anterior portion of her leg on the right side Which occurred when she was in [place] December 5. She tells me that she was getting into a cab when she hit it on the Door and subsequently had a significant injury here. She ended up not going anywhere to have this checked at the hospital or urgent care. Subsequently she has been taking care of this for the most part on her own at home until just recently when she saw her primary care provider on this past week where they prescribed doxycycline for her and then placed her in an Unna boot with Telfa pads underneath. Subsequently the patient was advised to keep this on until she saw [place] and she did so. Nonetheless the biggest issue I see is really some of the skin was rolled up underneath and has reattached to some degree this can make it difficult  for this to heal in that way we can have to try to see what we can do to straighten this situation out. The patient does have a history of hypertension, long-term use of anticoagulant therapy, Coumadin, due to atrial fibrillation, congestive heart failure,  and again the current wound which we are managing today. 05/12/2021 upon evaluation today patient appears to be doing also in regard to her wound. This actually been since 1 January that I saw her last. She subsequently following the week I saw her went to visit with her sister at the beach. She has been gone during that time in between and they have been using Xeroform this is significantly smaller. With that being said it does show signs of being a little hyper granulated but nonetheless I think this is not anything that we cannot manage at this point. I discussed that with the patient today as well. 05/31/2021 upon evaluation today patient appears to be doing well with regard to her leg ulcer. This is getting very close to healed and honestly is a lot drier than what would like to see. Fortunately I do not see any signs of infection which is great news the biggest issue that I see is simply that I think that the [facility] made this a bit too dry over the past week. Electronic Signature(s) Signed: 05/31/2021 1:37:48 PM By: Worthy Keeler PA-C Entered By: Worthy Keeler on 05/31/2021 13:37:48 LUCIELLE, VOKES ([id number]) -------------------------------------------------------------------------------- Physical Exam Details Patient Name: [name], [name] Date of Service: 05/31/2021 10:15 AM Medical Record Number: [id number] Patient Account Number: [id number] Date of Birth/Sex: [birth year] (88 y.o. F) Treating RN: Carlene Coria Primary Care Provider: Ramonita Lab Other Clinician: Referring Provider: Ramonita Lab Treating Provider/Extender: Jeri Cos Weeks in Treatment: 7 Constitutional Well-nourished and well-hydrated in no acute  distress. Respiratory normal breathing without difficulty. Psychiatric this patient is able to make decisions and demonstrates good insight into disease process. Alert and Oriented x 3. pleasant and cooperative. Notes Upon inspection patient's wound bed actually showed signs of good granulation and epithelization at this point. Fortunately I do not see any evidence of active infection locally nor systemically at this time which is great news and overall I am extremely pleased with where we stand today. Electronic Signature(s) Signed: 05/31/2021 1:38:07 PM By: Worthy Keeler PA-C Entered By: Worthy Keeler on 05/31/2021 13:38:07 PATI, THINNES ([id number]) -------------------------------------------------------------------------------- Physician Orders Details Patient Name: [name], [name] Date of Service: 05/31/2021 10:15 AM Medical Record Number: [id number] Patient Account Number: [id number] Date of Birth/Sex: Jan 24, 1927 (88 y.o. F) Treating RN: Carlene Coria Primary Care Provider: Ramonita Lab Other Clinician: Referring Provider: Ramonita Lab Treating Provider/Extender: Skipper Cliche in Treatment: 7 Verbal / Phone Orders: No Diagnosis Coding ICD-10 Coding Code Description [phone] Laceration without foreign body, right lower leg, initial encounter L97.812 Non-pressure chronic ulcer of other part of right lower leg with fat layer exposed I10 Essential (primary) hypertension Z79.01 Long term (current) use of anticoagulants I50.42 Chronic combined systolic (congestive) and diastolic (congestive) heart failure I48.0 Paroxysmal atrial fibrillation Follow-up Appointments o Return Appointment in 1 week. o Nurse Visit as needed [employer] o Wash wounds with antibacterial soap and water. o May shower; gently cleanse wound with antibacterial soap, rinse and pat dry prior to dressing wounds o No tub bath. Wound Treatment Wound #1 - Lower Leg Wound  Laterality: Right Cleanser: Byram Ancillary Kit - 15 Day Supply (Generic) 3 x Per Week/30 Days Discharge Instructions: Use supplies as instructed; Kit contains: (15) Saline Bullets; (15) 3x3 Gauze; 15 pr Gloves Primary Dressing: Xeroform 4x4-HBD (in/in) 3 x Per Week/30 Days Discharge Instructions: Apply Xeroform 4x4-HBD (in/in) as directed Secondary Dressing: Coverlet Latex-Free Fabric Adhesive Dressings 3 x Per Week/30 Days Discharge Instructions:  1.5 x 2 Electronic Signature(s) Signed: 05/31/2021 4:37:47 PM By: Worthy Keeler PA-C Signed: 05/31/2021 4:37:51 PM By: Carlene Coria RN Entered By: Carlene Coria on 05/31/2021 10:34:49 ZINIA, INNOCENT ([id number]) -------------------------------------------------------------------------------- Problem List Details Patient Name: ETOILE, LOOMAN Date of Service: 05/31/2021 10:15 AM Medical Record Number: [id number] Patient Account Number: [id number] Date of Birth/Sex: 1926/04/11 (88 y.o. F) Treating RN: Carlene Coria Primary Care Provider: Ramonita Lab Other Clinician: Referring Provider: Ramonita Lab Treating Provider/Extender: Skipper Cliche in Treatment: 7 Active Problems ICD-10 Encounter Code Description Active Date MDM Diagnosis S81.811A Laceration without foreign body, right lower leg, initial encounter 04/12/2021 No Yes L97.812 Non-pressure chronic ulcer of other part of right lower leg with fat layer 04/12/2021 No Yes exposed [place] (primary) hypertension 04/12/2021 No Yes Z79.01 Long term (current) use of anticoagulants 04/12/2021 No Yes I50.42 Chronic combined systolic (congestive) and diastolic (congestive) heart 04/12/2021 No Yes failure I48.0 Paroxysmal atrial fibrillation 04/12/2021 No Yes Inactive Problems Resolved Problems Electronic Signature(s) Signed: 05/31/2021 10:21:03 AM By: Worthy Keeler PA-C Entered By: Worthy Keeler on 05/31/2021 10:21:03 [name]  ([id number]) -------------------------------------------------------------------------------- Progress Note Details Patient Name: [name] Date of Service: 05/31/2021 10:15 AM Medical Record Number: [id number] Patient Account Number: [id number] Date of Birth/Sex: [birth year] (88 y.o. F) Treating RN: Carlene Coria Primary Care Provider: Ramonita Lab Other Clinician: Referring Provider: Ramonita Lab Treating Provider/Extender: Skipper Cliche in Treatment: 7 Subjective Chief Complaint Information obtained from Patient Right shin laceration History of Present Illness (HPI) 04/12/2021 upon evaluation today patient appears to be doing well in general in regard to her wound all things considered. She is unfortunately experiencing an issue with a fairly significant skin tear to the anterior portion of her leg on the right side Which occurred when she was in [place] December 5. She tells me that she was getting into a cab when she hit it on the Door and subsequently had a significant injury here. She ended up not going anywhere to have this checked at the hospital or urgent care. Subsequently she has been taking care of this for the most part on her own at home until just recently when she saw her primary care provider on this past week where they prescribed doxycycline for her and then placed her in an Unna boot with Telfa pads underneath. Subsequently the patient was advised to keep this on until she saw [place] and she did so. Nonetheless the biggest issue I see is really some of the skin was rolled up underneath and has reattached to some degree this can make it difficult for this to heal in that way we can have to try to see what we can do to straighten this situation out. The patient does have a history of hypertension, long-term use of anticoagulant therapy, Coumadin, due to atrial fibrillation, congestive heart failure, and again the current wound which we are managing today. 05/12/2021  upon evaluation today patient appears to be doing also in regard to her wound. This actually been since 1 January that I saw her last. She subsequently following the week I saw her went to visit with her sister at the beach. She has been gone during that time in between and they have been using Xeroform this is significantly smaller. With that being said it does show signs of being a little hyper granulated but nonetheless I think this is not anything that we cannot manage at this point. I discussed that with the patient today  as well. 05/31/2021 upon evaluation today patient appears to be doing well with regard to her leg ulcer. This is getting very close to healed and honestly is a lot drier than what would like to see. Fortunately I do not see any signs of infection which is great news the biggest issue that I see is simply that I think that the [facility] made this a bit too dry over the past week. Objective Constitutional Well-nourished and well-hydrated in no acute distress. Vitals Time Taken: 10:18 AM, Height: 63 in, Weight: 130 lbs, BMI: 23, Temperature: 98 F, Pulse: 99 bpm, Respiratory Rate: 18 breaths/min, Blood Pressure: 146/89 mmHg. Respiratory normal breathing without difficulty. Psychiatric this patient is able to make decisions and demonstrates good insight into disease process. Alert and Oriented x 3. pleasant and cooperative. General Notes: Upon inspection patient's wound bed actually showed signs of good granulation and epithelization at this point. Fortunately I do not see any evidence of active infection locally nor systemically at this time which is great news and overall I am extremely pleased with where we stand today. Integumentary (Hair, Skin) Wound #1 status is Open. Original cause of wound was Trauma. The date acquired was: 03/07/2021. The wound has been in treatment 7 weeks. The wound is located on the Right Lower Leg. The wound measures 1cm length x 0.7cm width x  0.1cm depth; 0.55cm^2 area and 0.055cm^3 volume. There is Fat Layer (Subcutaneous Tissue) exposed. There is no tunneling or undermining noted. There is a medium amount of serosanguineous drainage noted. The wound margin is flat and intact. There is medium (34-66%) red, hyper - granulation within the wound bed. There is a medium (34-66%) amount of necrotic tissue within the wound bed including Adherent Slough. ANALYS, RYDEN ([id number]) Assessment Active Problems ICD-10 Laceration without foreign body, right lower leg, initial encounter Non-pressure chronic ulcer of other part of right lower leg with fat layer exposed Essential (primary) hypertension Long term (current) use of anticoagulants Chronic combined systolic (congestive) and diastolic (congestive) heart failure Paroxysmal atrial fibrillation Procedures Wound #1 Pre-procedure diagnosis of Wound #1 is a Trauma, Other located on the Right Lower Leg . There was a Excisional Skin/Subcutaneous Tissue Debridement with a total area of 0.7 sq cm performed by Tommie Sams., PA-C. With the following instrument(s): Curette to remove Viable tissue/material. Material removed includes Subcutaneous Tissue and Slough and after achieving pain control using Lidocaine 4% Topical Solution. No specimens were taken. A time out was conducted at 10:30, prior to the start of the procedure. A Minimum amount of bleeding was controlled with Pressure. The procedure was tolerated well with a pain level of 0 throughout and a pain level of 0 following the procedure. Post Debridement Measurements: 1cm length x 0.7cm width x 0.1cm depth; 0.055cm^3 volume. Character of Wound/Ulcer Post Debridement is improved. Post procedure Diagnosis Wound #1: Same as Pre-Procedure Plan Follow-up Appointments: Return Appointment in 1 week. Nurse Visit as needed Bathing/ Shower/ Hygiene: Wash wounds with antibacterial soap and water. May shower; gently cleanse wound with  antibacterial soap, rinse and pat dry prior to dressing wounds No tub bath. WOUND #1: - Lower Leg Wound Laterality: Right Cleanser: Byram Ancillary Kit - 15 Day Supply (Generic) 3 x Per Week/30 Days Discharge Instructions: Use supplies as instructed; Kit contains: (15) Saline Bullets; (15) 3x3 Gauze; 15 pr Gloves Primary Dressing: Xeroform 4x4-HBD (in/in) 3 x Per Week/30 Days Discharge Instructions: Apply Xeroform 4x4-HBD (in/in) as directed Secondary Dressing: Coverlet Latex-Free Fabric Adhesive Dressings 3  x Per Week/30 Days Discharge Instructions: 1.5 x 2 1. Would recommend currently that we go ahead and actually switch back over to the Xeroform gauze dressing at this time. I think this is probably can to be the best way to go. 2. I am also getting go ahead and recommend that we continue with large Band-Aid to cover which I think would be appropriate there really is not much drainage here. We will see patient back for reevaluation in 1 week here in the clinic. If anything worsens or changes patient will contact our office for additional recommendations. Electronic Signature(s) Signed: 05/31/2021 2:51:32 PM By: Worthy Keeler PA-C Entered By: Worthy Keeler on 05/31/2021 14:51:32 Zhang, Purvis Kilts ([id number]) -------------------------------------------------------------------------------- SuperBill Details Patient Name: [name], [name] Date of Service: 05/31/2021 Medical Record Number: [id number] Patient Account Number: [id number] Date of Birth/Sex: October 02, 1926 (88 y.o. F) Treating RN: Carlene Coria Primary Care Provider: Ramonita Lab Other Clinician: Referring Provider: Ramonita Lab Treating Provider/Extender: Skipper Cliche in Treatment: 7 Diagnosis Coding ICD-10 Codes Code Description [phone] Laceration without foreign body, right lower leg, initial encounter L97.812 Non-pressure chronic ulcer of other part of right lower leg with fat layer exposed [place] (primary)  hypertension Z79.01 Long term (current) use of anticoagulants I50.42 Chronic combined systolic (congestive) and diastolic (congestive) heart failure I48.0 Paroxysmal atrial fibrillation Facility Procedures CPT4 Code: [id number] Description: 26415 - DEB SUBQ TISSUE 20 SQ CM/< Modifier: Quantity: 1 CPT4 Code: Description: ICD-10 Diagnosis Description A30.940 Non-pressure chronic ulcer of other part of right lower leg with fat lay S81.811A Laceration without foreign body, right lower leg, initial encounter Modifier: er exposed Quantity: Physician Procedures CPT4 Code: 7680881 Description: 11042 - WC PHYS SUBQ TISS 20 SQ CM Modifier: Quantity: 1 CPT4 Code: Description: ICD-10 Diagnosis Description J03.159 Non-pressure chronic ulcer of other part of right lower leg with fat lay S81.811A Laceration without foreign body, right lower leg, initial encounter Modifier: er exposed Quantity: Electronic Signature(s) Signed: 05/31/2021 2:51:46 PM By: Worthy Keeler PA-C Entered By: Worthy Keeler on 05/31/2021 14:51:45

## 2021-06-07 ENCOUNTER — Ambulatory Visit: Payer: Medicare Other | Admitting: Physician Assistant

## 2021-06-13 ENCOUNTER — Ambulatory Visit: Payer: Medicare Other | Admitting: Internal Medicine

## 2021-06-17 ENCOUNTER — Ambulatory Visit: Payer: Medicare Other | Admitting: Physician Assistant

## 2021-12-12 ENCOUNTER — Other Ambulatory Visit: Payer: Self-pay | Admitting: Internal Medicine

## 2021-12-12 DIAGNOSIS — G4489 Other headache syndrome: Secondary | ICD-10-CM

## 2021-12-12 DIAGNOSIS — R519 Headache, unspecified: Secondary | ICD-10-CM

## 2021-12-13 ENCOUNTER — Ambulatory Visit
Admission: RE | Admit: 2021-12-13 | Discharge: 2021-12-13 | Disposition: A | Discharge status: Home / Self Care | Payer: Medicare Other | Source: Ambulatory Visit | Attending: Internal Medicine | Admitting: Internal Medicine

## 2021-12-13 DIAGNOSIS — R519 Headache, unspecified: Secondary | ICD-10-CM | POA: Diagnosis present

## 2021-12-13 DIAGNOSIS — G4489 Other headache syndrome: Secondary | ICD-10-CM | POA: Diagnosis present

## 2021-12-27 ENCOUNTER — Other Ambulatory Visit: Payer: Self-pay | Admitting: Internal Medicine

## 2021-12-27 ENCOUNTER — Other Ambulatory Visit (HOSPITAL_COMMUNITY): Payer: Self-pay | Admitting: Internal Medicine

## 2021-12-27 DIAGNOSIS — H532 Diplopia: Secondary | ICD-10-CM

## 2021-12-27 DIAGNOSIS — H539 Unspecified visual disturbance: Secondary | ICD-10-CM

## 2022-01-03 ENCOUNTER — Ambulatory Visit
Admission: RE | Admit: 2022-01-03 | Discharge: 2022-01-03 | Disposition: A | Discharge status: Home / Self Care | Payer: Medicare Other | Source: Ambulatory Visit | Attending: Internal Medicine | Admitting: Internal Medicine

## 2022-01-03 DIAGNOSIS — H532 Diplopia: Secondary | ICD-10-CM | POA: Insufficient documentation

## 2022-01-03 DIAGNOSIS — H539 Unspecified visual disturbance: Secondary | ICD-10-CM | POA: Insufficient documentation

## 2022-01-03 LAB — POCT I-STAT CREATININE: Creatinine, Ser: 0.8 mg/dL (ref 0.44–1.00)

## 2022-01-03 MED ORDER — IOHEXOL 300 MG/ML  SOLN
75.0000 mL | Freq: Once | INTRAMUSCULAR | Status: AC | PRN
  Administered 2022-01-03: 75 mL via INTRAVENOUS

## 2022-01-19 ENCOUNTER — Other Ambulatory Visit
Admission: RE | Admit: 2022-01-19 | Discharge: 2022-01-19 | Disposition: A | Discharge status: Home / Self Care | Payer: Medicare Other | Attending: Oculoplastics Ophthalmology | Admitting: Oculoplastics Ophthalmology

## 2022-01-19 DIAGNOSIS — E0500 Thyrotoxicosis with diffuse goiter without thyrotoxic crisis or storm: Secondary | ICD-10-CM | POA: Diagnosis present

## 2022-01-19 DIAGNOSIS — H5789 Other specified disorders of eye and adnexa: Secondary | ICD-10-CM | POA: Diagnosis not present

## 2022-01-19 LAB — SEDIMENTATION RATE: Sed Rate: 7 mm/hr (ref 0–30)

## 2022-01-19 LAB — T4, FREE: Free T4: 0.72 ng/dL (ref 0.61–1.12)

## 2022-01-19 LAB — C-REACTIVE PROTEIN: CRP: 0.6 mg/dL (ref <1.0)

## 2022-01-19 LAB — TSH: TSH: 0.814 u[IU]/mL (ref 0.350–4.500)

## 2022-01-20 LAB — ANCA PROFILE
Anti-MPO Antibodies: <0.2 units (ref 0.0–0.9)
Anti-PR3 Antibodies: <0.2 units (ref 0.0–0.9)
Atypical P-ANCA titer: <1:20 {titer}
C-ANCA: <1:20 {titer}
P-ANCA: <1:20 {titer}

## 2022-01-20 LAB — RHEUMATOID FACTOR: Rheumatoid fact SerPl-aCnc: <10 IU/mL (ref <14.0)

## 2022-01-20 LAB — THYROGLOBULIN ANTIBODY: Thyroglobulin Antibody: <1 IU/mL (ref 0.0–0.9)

## 2022-01-20 LAB — ANA: Anti Nuclear Antibody (ANA): NEGATIVE

## 2022-01-20 LAB — THYROID STIMULATING IMMUNOGLOBULIN: Thyroid Stimulating Immunoglob: <0.1 IU/L (ref 0.00–0.55)

## 2022-01-21 LAB — THYROTROPIN RECEPTOR AUTOABS: Thyrotropin Receptor Ab: <1.1 IU/L (ref 0.00–1.75)

## 2022-01-21 LAB — MISC LABCORP TEST (SEND OUT): Labcorp test code: 10314

## 2022-05-10 ENCOUNTER — Ambulatory Visit: Payer: Medicare Other | Admitting: Cardiology

## 2022-05-15 ENCOUNTER — Ambulatory Visit: Payer: Medicare Other | Admitting: Cardiovascular Disease

## 2022-05-17 ENCOUNTER — Ambulatory Visit: Payer: Medicare Other | Attending: Cardiovascular Disease | Admitting: Cardiology

## 2022-05-17 ENCOUNTER — Encounter: Payer: Self-pay | Admitting: Cardiology

## 2022-05-17 VITALS — BP 120/68 | HR 82 | Ht 62.0 in | Wt 129.0 lb

## 2022-05-17 DIAGNOSIS — I495 Sick sinus syndrome: Secondary | ICD-10-CM

## 2022-05-17 DIAGNOSIS — I4891 Unspecified atrial fibrillation: Secondary | ICD-10-CM

## 2022-05-17 DIAGNOSIS — I10 Essential (primary) hypertension: Secondary | ICD-10-CM | POA: Diagnosis present

## 2022-05-17 DIAGNOSIS — Z950 Presence of cardiac pacemaker: Secondary | ICD-10-CM | POA: Insufficient documentation

## 2022-05-17 LAB — PACEMAKER DEVICE OBSERVATION

## 2022-05-17 MED ORDER — SPIRONOLACTONE 25 MG PO TABS: 25.0000 mg | ORAL_TABLET | ORAL | 3 refills | Status: DC

## 2022-05-17 NOTE — Patient Instructions (Signed)
Medication Instructions:  Your physician has recommended you make the following change in your medication:  1) CHANGE spironolactone to 25 mg every other day *If you need a refill on your cardiac medications before your next appointment, please call your pharmacy*  Follow-Up: At [facility], you and your health needs are our priority.  As part of our continuing mission to provide you with exceptional heart care, we have created designated Provider Care Teams.  These Care Teams include your primary Cardiologist (physician) and Advanced Practice Providers (APPs -  Physician Assistants and Nurse Practitioners) who all work together to provide you with the care you need, when you need it.  Your next appointment:   1 year(s)  Provider:   You will see one of the following Advanced Practice Providers on your designated Care Team:   Tommye Standard, [place]" [place], [place], NP

## 2022-05-17 NOTE — Progress Notes (Addendum)
Electrophysiology Office Note:    Date:  05/17/2022   ID:  [name], DOB [birth year], MRN [id number]  PCP:  Adin Hector, MD  [place] Cardiologist:  None  [facility] Electrophysiologist:  Vickie Epley, MD   Referring MD: Adin Hector, MD   Chief Complaint: AF, establish care for pacemaker  History of Present Illness:    [name] is a 89 y.o. female who presents for an evaluation of atrial fibrillation and permanent pacemaker at the request of Dr. Caryl Comes. Their medical history includes Permanent atrial fibrillation, hypertension, sick sinus syndrome, permanent pacemaker in situ.  She has been on Eliquis for stroke prophylaxis.  The patient was previously followed by Dr. Nehemiah Massed at [facility].     Past Medical History:  Diagnosis Date   Arthritis    Atrial fibrillation (HCC)    Cancer (HCC)    Basal cell many times on face   CHF (congestive heart failure) (HCC)    Coronary artery disease    Dyspnea    sob when walking stairs   Dysrhythmia    AF/SSS/NSR   Fibroid    Hypertension    Menopausal symptoms    Osteoporosis 2018   T score -2.8 stable/improved from prior DEXA   Polymyalgia ([place])    Presence of permanent cardiac pacemaker     Past Surgical History:  Procedure Laterality Date   BASAL CELL CARCINOMA EXCISION     BREAST SURGERY Left 1958   Breast Bx   CARDIAC CATHETERIZATION  2005   CARDIAC ELECTROPHYSIOLOGY STUDY AND ABLATION     FLEXIBLE SIGMOIDOSCOPY N/A 10/27/2016   Procedure: FLEXIBLE SIGMOIDOSCOPY;  Surgeon: Jonathon Bellows, MD;  Location: [facility] ENDOSCOPY;  Service: Gastroenterology;  Laterality: N/A;   HYSTEROSCOPY     X 2   IMPLANTABLE CARDIOVERTER DEFIBRILLATOR (ICD) GENERATOR CHANGE N/A 05/01/2018   Procedure: PACEMAKER BATTERY CHANGE CURRENT=[facility], NEW=MEDTRONIC;  Surgeon: Isaias Cowman, MD;  Location: [facility] ORS;  Service: Cardiovascular;  Laterality: N/A;   INSERT / REPLACE / REMOVE PACEMAKER     SKIN  BIOPSY     basal cell removed many times    Current Medications: Current Meds  Medication Sig   acetaminophen (TYLENOL) 500 MG tablet Take 500 mg by mouth every 6 (six) hours as needed for moderate pain.   amLODipine (NORVASC) 5 MG tablet Take 5 mg by mouth daily.   apixaban (ELIQUIS) 2.5 MG TABS tablet Take 2.5 mg by mouth 2 (two) times daily.   Biotin 1 MG CAPS Take 1 capsule by mouth daily.   cyanocobalamin (VITAMIN B12) 1000 MCG/ML injection Inject 1,000 mcg into the muscle every 30 (thirty) days.   Melatonin 10 MG TABS Take 10 mg by mouth as needed.   spironolactone (ALDACTONE) 25 MG tablet Take 1 tablet (25 mg total) by mouth every other day.   tobramycin-dexamethasone (TOBRADEX) ophthalmic solution Place 1 drop into the right eye every 6 (six) hours.   [DISCONTINUED] Spironolactone (ALDACTONE PO) Take 25 mg by mouth daily.      Allergies:   Patient has no known allergies.   Social History   Socioeconomic History   Marital status: Widowed    Spouse name: Not on file   Number of children: Not on file   Years of education: Not on file   Highest education level: Not on file  Occupational History   Occupation: RN    Comment: retired  Tobacco Use   Smoking status: Never  Smokeless tobacco: Never  Vaping Use   Vaping Use: Never used  Substance and Sexual Activity   Alcohol use: Yes    Alcohol/week: 4.0 standard drinks of alcohol    Types: 4 Standard drinks or equivalent per week   Drug use: No   Sexual activity: Not Currently    Birth control/protection: Post-menopausal    Comment: 1st intercourse 89 yo-1 partner  Other Topics Concern   Not on file  Social History Narrative   Not on file   Social Determinants of Health   Financial Resource Strain: Not on file  Food Insecurity: Not on file  Transportation Needs: Not on file  Physical Activity: Not on file  Stress: Not on file  Social Connections: Not on file     Family History: The patient's family history  includes Heart disease in her father and mother; Hypertension in her mother; Osteoporosis in her father.  ROS:   Please see the history of present illness.    All other systems reviewed and are negative.  EKGs/Labs/Other Studies Reviewed:    The following studies were reviewed today:  July 18, 2021 echo Normal LV function Normal RV function Moderate AI, MR, TR Mild PR  Chest x-ray on April 29, 2018 shows dual-chamber permanent pacemaker in situ  May 17, 2022 in clinic device interrogation personally reviewed Lead parameter stable Battery longevity 8.5 years Atrial fibrillation burden 18.8% Atrial pacing 49.5% Ventricular pacing 48%    Recent Labs: 01/03/2022: Creatinine, Ser 0.80 01/19/2022: TSH 0.814  Recent Lipid Panel No results found for: "CHOL", "TRIG", "HDL", "CHOLHDL", "VLDL", "LDLCALC", "LDLDIRECT"  Physical Exam:    VS:  BP 120/68   Pulse 82   Ht 5' 2"$  (1.575 m)   Wt 129 lb (58.5 kg)   SpO2 98%   BMI 23.59 kg/m     Wt Readings from Last 3 Encounters:  05/17/22 129 lb (58.5 kg)  09/26/18 129 lb (58.5 kg)  04/29/18 136 lb 5 oz (61.8 kg)     GEN:  Well nourished, well developed in no acute distress.  Elderly CARDIAC: RRR, no murmurs, rubs, gallops.  Pacemaker pocket well-healed RESPIRATORY:  Clear to auscultation without rales, wheezing or rhonchi       ASSESSMENT:    1. Atrial fibrillation, unspecified type ([place])   2. Sick sinus syndrome (HCC)   3. Cardiac pacemaker in situ    PLAN:    In order of problems listed above:  #Permanent atrial fibrillation Rate controlled On Eliquis for stroke prophylaxis  #Sick sinus syndrome #Permanent pacemaker in situ Device functioning appropriately.  Establish for remote monitoring with our clinic.  #Hypertension At goal today.  She would like to reduce her spironolactone to every other day which I think is reasonable with careful monitoring of blood pressures moving forward.  Recommend  checking blood pressures 1-2 times per week at home and recording the values.  Recommend bringing these recordings to the primary care physician.  Follow-up 1 year with APP.     Medication Adjustments/Labs and Tests Ordered: Current medicines are reviewed at length with the patient today.  Concerns regarding medicines are outlined above.  No orders of the defined types were placed in this encounter.  Meds ordered this encounter  Medications   spironolactone (ALDACTONE) 25 MG tablet    Sig: Take 1 tablet (25 mg total) by mouth every other day.    Dispense:  45 tablet    Refill:  3     Signed,  T. Quentin Ore,  MD, [facility], [facility] 05/17/2022 4:16 PM    Electrophysiology [facility]

## 2022-06-09 ENCOUNTER — Encounter: Payer: Self-pay | Admitting: Cardiology

## 2022-07-24 ENCOUNTER — Telehealth: Payer: Self-pay | Admitting: Cardiology

## 2022-07-24 MED ORDER — APIXABAN 2.5 MG PO TABS: 2.5000 mg | ORAL_TABLET | Freq: Two times a day (BID) | ORAL | 1 refills | Status: DC

## 2022-07-24 NOTE — Telephone Encounter (Signed)
Please advise if OK to fill under Dr. Lalla Brothers or defer to PCP. Thank you!

## 2022-07-24 NOTE — Telephone Encounter (Signed)
Refill request for Eliquis

## 2022-07-24 NOTE — Telephone Encounter (Signed)
Prescription refill request for Eliquis received. Indication: AF Last office visit: 05/17/22  Jeanie Cooks MD Scr: 0.7 on 06/07/22 Age: 89 Weight: 58.5kg  Based on above findings Eliquis 2.5mg  twice daily is the appropriate dose.  Refill approved.

## 2022-07-24 NOTE — Telephone Encounter (Signed)
*  STAT* If patient is at the pharmacy, call can be transferred to refill team.   1. Which medications need to be refilled? (please list name of each medication and dose if known) amLODipine (NORVASC) 5 MG tablet ; apixaban (ELIQUIS) 2.5 MG TABS tablet   2. Which pharmacy/location (including street and city if local pharmacy) is medication to be sent to? [facility] - [place], [place] - [address]   3. Do they need a 30 day or 90 day supply? 90

## 2022-07-25 ENCOUNTER — Other Ambulatory Visit: Payer: Self-pay | Admitting: Ophthalmology

## 2022-07-25 DIAGNOSIS — H532 Diplopia: Secondary | ICD-10-CM

## 2022-07-28 ENCOUNTER — Ambulatory Visit
Admission: RE | Admit: 2022-07-28 | Discharge: 2022-07-28 | Disposition: A | Discharge status: Home / Self Care | Payer: Medicare Other | Source: Ambulatory Visit | Attending: Ophthalmology | Admitting: Ophthalmology

## 2022-07-28 DIAGNOSIS — H532 Diplopia: Secondary | ICD-10-CM | POA: Diagnosis not present

## 2022-07-28 LAB — POCT I-STAT CREATININE: Creatinine, Ser: 0.7 mg/dL (ref 0.44–1.00)

## 2022-07-28 MED ORDER — IOHEXOL 300 MG/ML  SOLN
75.0000 mL | Freq: Once | INTRAMUSCULAR | Status: AC | PRN
  Administered 2022-07-28: 75 mL via INTRAVENOUS

## 2022-08-04 ENCOUNTER — Encounter (HOSPITAL_COMMUNITY): Payer: Self-pay

## 2022-08-04 ENCOUNTER — Other Ambulatory Visit: Payer: Self-pay

## 2022-08-04 ENCOUNTER — Emergency Department (HOSPITAL_COMMUNITY): Payer: Medicare Other

## 2022-08-04 ENCOUNTER — Telehealth: Payer: Self-pay | Admitting: Cardiology

## 2022-08-04 ENCOUNTER — Emergency Department (HOSPITAL_COMMUNITY)
Admission: EM | Admit: 2022-08-04 | Discharge: 2022-08-04 | Disposition: A | Discharge status: Home / Self Care | Payer: Medicare Other | Attending: Emergency Medicine | Admitting: Emergency Medicine

## 2022-08-04 DIAGNOSIS — Z7901 Long term (current) use of anticoagulants: Secondary | ICD-10-CM | POA: Diagnosis not present

## 2022-08-04 DIAGNOSIS — I160 Hypertensive urgency: Secondary | ICD-10-CM | POA: Insufficient documentation

## 2022-08-04 DIAGNOSIS — R519 Headache, unspecified: Secondary | ICD-10-CM

## 2022-08-04 LAB — COMPREHENSIVE METABOLIC PANEL
ALT: 8 U/L (ref 0–44)
AST: 18 U/L (ref 15–41)
Albumin: 3.7 g/dL (ref 3.5–5.0)
Alkaline Phosphatase: 61 U/L (ref 38–126)
Anion gap: 11 (ref 5–15)
BUN: 14 mg/dL (ref 8–23)
CO2: 21 mmol/L — ABNORMAL LOW (ref 22–32)
Calcium: 9.1 mg/dL (ref 8.9–10.3)
Chloride: 107 mmol/L (ref 98–111)
Creatinine, Ser: 0.79 mg/dL (ref 0.44–1.00)
GFR, Estimated: >60 mL/min (ref >60)
Glucose, Bld: 83 mg/dL (ref 70–99)
Potassium: 4.2 mmol/L (ref 3.5–5.1)
Sodium: 139 mmol/L (ref 135–145)
Total Bilirubin: 1 mg/dL (ref 0.3–1.2)
Total Protein: 6.5 g/dL (ref 6.5–8.1)

## 2022-08-04 LAB — CBC WITH DIFFERENTIAL/PLATELET
Abs Immature Granulocytes: 0.01 10*3/uL (ref 0.00–0.07)
Basophils Absolute: 0 10*3/uL (ref 0.0–0.1)
Basophils Relative: 0 %
Eosinophils Absolute: 0.1 10*3/uL (ref 0.0–0.5)
Eosinophils Relative: 2 %
HCT: 41.4 % (ref 36.0–46.0)
Hemoglobin: 13.6 g/dL (ref 12.0–15.0)
Immature Granulocytes: 0 %
Lymphocytes Relative: 27 %
Lymphs Abs: 1.7 10*3/uL (ref 0.7–4.0)
MCH: 31.6 pg (ref 26.0–34.0)
MCHC: 32.9 g/dL (ref 30.0–36.0)
MCV: 96.1 fL (ref 80.0–100.0)
Monocytes Absolute: 0.7 10*3/uL (ref 0.1–1.0)
Monocytes Relative: 12 %
Neutro Abs: 3.6 10*3/uL (ref 1.7–7.7)
Neutrophils Relative %: 59 %
Platelets: 178 10*3/uL (ref 150–400)
RBC: 4.31 MIL/uL (ref 3.87–5.11)
RDW: 12.3 % (ref 11.5–15.5)
WBC: 6.1 10*3/uL (ref 4.0–10.5)
nRBC: 0 % (ref 0.0–0.2)

## 2022-08-04 LAB — T4, FREE: Free T4: 0.83 ng/dL (ref 0.61–1.12)

## 2022-08-04 LAB — TSH: TSH: 1.437 u[IU]/mL (ref 0.350–4.500)

## 2022-08-04 LAB — BRAIN NATRIURETIC PEPTIDE: B Natriuretic Peptide: 183.8 pg/mL — ABNORMAL HIGH (ref 0.0–100.0)

## 2022-08-04 NOTE — Discharge Instructions (Signed)
As discussed, your evaluation today has been largely reassuring.  But, it is important that you monitor your condition carefully, and do not hesitate to return to the ED if you develop new, or concerning changes in your condition.  These began using Tylenol, 500 mg, 3 times daily until you have seen your physician.  Please follow-up with your physician for appropriate ongoing care.

## 2022-08-04 NOTE — Telephone Encounter (Signed)
Pt c/o BP issue: STAT if pt c/o blurred vision, one-sided weakness or slurred speech  1. What are your last 5 BP readings?  5/03: 172/88, 151/85  2. Are you having any other symptoms (ex. Dizziness, headache, blurred vision, passed out)?  Dizziness, sharp pain in upper corner of patient's head  3. What is your BP issue?   Patient's daughter states patient's BP has been elevated and she hasn't been feeling well since about 5:00 AM, which is abnormal for her. BP was 172/88. She is sitting upright. No nausea, but patient is dizzy. She took her medications around 8:00 AM. BP dropped down to 151/85, but eventually went back up due to activity. Patient has not had any food today, only water. She did not sleep well, but doesn't remember having the sharp headache during the night, she noticed they were present when she woke up. Please return call to discuss.

## 2022-08-04 NOTE — ED Provider Notes (Signed)
[place] EMERGENCY DEPARTMENT AT [facility] Provider Note   CSN: [id number] Arrival date & time: 08/04/22  1518     History  Chief Complaint  Patient presents with   Hypertension   Headache    [name] is a 89 y.o. female.  HPI 89 year old female presents with her daughter who assists with the history.  She presents with concern for episodic headache.  She does not typically have headaches.  Today over the past 14 hours she has had episodes of headache in the left frontal and parietal region.  Episodes are brief, no clear precipitating, alleviating or exacerbating factors.  Patient notes that she has a history of A-fib, has been taking her Eliquis.  She also has relatively recent development of orbitopathy, for which she is seeing ophthalmology, primary care, has had multiple tests, imaging studies, multiple interventions, but has had progression of swelling, discomfort about both eyes. This is essentially unchanged over the course of the day today. Most recent imaging evaluation was 4 days ago according the patient and her daughter. No recent medication changes for that phenomena.    Home Medications Prior to Admission medications   Medication Sig Start Date End Date Taking? Authorizing Provider  acetaminophen (TYLENOL) 500 MG tablet Take 500 mg by mouth every 6 (six) hours as needed for moderate pain.    [provider]  amLODipine (NORVASC) 5 MG tablet Take 5 mg by mouth daily. 11/10/21   [provider]  apixaban (ELIQUIS) 2.5 MG TABS tablet Take 1 tablet (2.5 mg total) by mouth 2 (two) times daily. 07/24/22   Lanier Prude, MD  Biotin 1 MG CAPS Take 1 capsule by mouth daily.    [provider]  cyanocobalamin (VITAMIN B12) 1000 MCG/ML injection Inject 1,000 mcg into the muscle every 30 (thirty) days. 09/23/18   [provider]  Melatonin 10 MG TABS Take 10 mg by mouth as needed.    [provider]   spironolactone (ALDACTONE) 25 MG tablet Take 1 tablet (25 mg total) by mouth every other day. 05/17/22   Lanier Prude, MD      Allergies    Moxifloxacin    Review of Systems   Review of Systems  All other systems reviewed and are negative.   Physical Exam Updated Vital Signs BP (!) 154/78   Pulse 60   Temp 98.1 F (36.7 C)   Resp 20   Ht 5\' 2"  (1.575 m)   Wt 56.7 kg   SpO2 97%   BMI 22.86 kg/m  Physical Exam Vitals and nursing note reviewed.  Constitutional:      General: She is not in acute distress.    Appearance: She is well-developed.  HENT:     Head: Normocephalic.   Eyes:     Conjunctiva/sclera: Conjunctivae normal.  Cardiovascular:     Rate and Rhythm: Normal rate and regular rhythm.  Pulmonary:     Effort: Pulmonary effort is normal. No respiratory distress.     Breath sounds: Normal breath sounds. No stridor.  Abdominal:     General: There is no distension.  Skin:    General: Skin is warm and dry.  Neurological:     Mental Status: She is alert and oriented to person, place, and time.     Cranial Nerves: No cranial nerve deficit.     Comments: Beyond age-appropriate atrophy neuroexam reassuring, unremarkable  Psychiatric:        Mood and Affect: Mood normal.  Mood is not anxious.     ED Results / Procedures / Treatments   Labs (all labs ordered are listed, but only abnormal results are displayed) Labs Reviewed  COMPREHENSIVE METABOLIC PANEL - Abnormal; Notable for the following components:      Result Value   CO2 21 (*)    All other components within normal limits  BRAIN NATRIURETIC PEPTIDE - Abnormal; Notable for the following components:   B Natriuretic Peptide 183.8 (*)    All other components within normal limits  CBC WITH DIFFERENTIAL/PLATELET  TSH  T4, FREE    EKG None  Radiology CT Head Wo Contrast  Result Date: 08/04/2022 CLINICAL DATA:  Trauma EXAM: CT HEAD WITHOUT CONTRAST TECHNIQUE: Contiguous axial images were obtained  from the base of the skull through the vertex without intravenous contrast. RADIATION DOSE REDUCTION: This exam was performed according to the departmental dose-optimization program which includes automated exposure control, adjustment of the mA and/or kV according to patient size and/or use of iterative reconstruction technique. COMPARISON:  CT Head 01/07/18, 07/28/22 FINDINGS: Brain: No evidence of acute infarction, hemorrhage, hydrocephalus, extra-axial collection or mass lesion/mass effect. Mineralization of the basal ganglia bilaterally. Generalized volume loss. Vascular: No hyperdense vessel or unexpected calcification. Skull: Mild soft tissue swelling along the left frontal scalp. No evidence of underlying calvarial fracture. There is lucent lesion that involves the outer table of the right frontal, unchanged compared to 2019. Sinuses/Orbits: No middle ear or mastoid effusion. Paranasal sinuses are clear. 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 Small bilateral Onodi cells. Other: None. IMPRESSION: 1. No acute intracranial abnormality. 2. Mild soft tissue swelling along the left frontal scalp. No evidence of underlying calvarial fracture. Electronically Signed   By: Lorenza Cambridge M.D.   On: 08/04/2022 17:07    Procedures Procedures    Medications Ordered in ED Medications - No data to display  ED Course/ Medical Decision Making/ A&P                             Medical Decision Making Elderly female with multiple medical issues including A-fib, on Eliquis, ongoing evaluation for bilateral orbital abnormalities presents with a new headache.  This is not similar to what she has been experiencing, but is not accompanied with any focal neurologic deficiencies, nor acute changes in vision.  Temporal arteritis a consideration, but less likely.  Benign headache versus intracranial mass  versus bleed more likely.  Given her history I reviewed her chart, and imaging studies from a few days ago suggest thyroid associated orbital abnormality.  Amount and/or Complexity of Data Reviewed Independent Historian: caregiver External Data Reviewed: notes. Labs: ordered. Decision-making details documented in ED Course. Radiology: ordered and independent interpretation performed. Decision-making details documented in ED Course. ECG/medicine tests: ordered and independent interpretation performed. Decision-making details documented in ED Course.  Risk Prescription drug management. Decision regarding hospitalization.  Cardiac 70 sinus normal Pulse ox 100% room air normal  10:39 PM Repeat exam, performed sometime ago, the patient was awake, alert, in no distress.  I reviewed her CT, discussed findings with her and her daughter.  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 Some suspicion for headache secondary to inflammatory changes with her disorder, no evidence for neurodeficits, nor evidence for acute new findings intracranially.  Given her reassuring  labs as well low suspicion for infection or other acute new phenomenon.  Patient and daughter both comfortable with outpatient follow-up.        Final Clinical Impression(s) / ED Diagnoses Final diagnoses:  Bad headache  Hypertensive urgency    Rx / DC Orders ED Discharge Orders     None         Gerhard Munch, MD 08/04/22 2240

## 2022-08-04 NOTE — Telephone Encounter (Signed)
Called and spoke with the patient's daughter regarding patient's bp issues. Daughter reported that the patient's bp was 176/100 this morning and patient was complaining of headache. Daughter was advised to contact the patient's PCP for bp recommendations. Per Dr. Lovena Neighbours note from last office visit he was going to defer bp management to Dr. Graciela Husbands. Patient's daughter was under the understanding that she had a General Cardiologist that would manage her bp issues. The daughter is requesting to establish care with a General Cardiologist and mentioned seeing Dr. Mariah Milling. Daughter was advised for the patient's immediate concerns today that she should reach out to Dr. Daniel Nones. Will forward to Dr. Mariah Milling to see if he will accept patient as her General Cardiologist.

## 2022-08-04 NOTE — ED Triage Notes (Signed)
Pt states since 0530 severe sharp intermittent pains on the top of head and elevated BP w/ increased HR. Hx of eye disease and treatments for 9 months w/ swelling. Has thickening of the muscle behind her eyes. Pt on Eliquis.

## 2022-08-07 NOTE — Telephone Encounter (Signed)
Left message on patient's daughter [name] as follows:  "Previously seen by [facility] general cardiology If she would like to transfer care to [facility] for general cardiac issues, we can set her up for new patient visit, any provider Reading the note, headache can cause blood pressure to go high Could try Tylenol for headache, can also help with arthritis in the neck which can contribute to headache Thx TGollan "

## 2022-08-10 ENCOUNTER — Other Ambulatory Visit
Admission: RE | Admit: 2022-08-10 | Discharge: 2022-08-10 | Disposition: A | Discharge status: Home / Self Care | Payer: Medicare Other | Source: Ambulatory Visit | Attending: Oculoplastics Ophthalmology | Admitting: Oculoplastics Ophthalmology

## 2022-08-10 DIAGNOSIS — E213 Hyperparathyroidism, unspecified: Secondary | ICD-10-CM | POA: Insufficient documentation

## 2022-08-10 DIAGNOSIS — E0590 Thyrotoxicosis, unspecified without thyrotoxic crisis or storm: Secondary | ICD-10-CM | POA: Insufficient documentation

## 2022-08-10 LAB — T4, FREE: Free T4: 0.66 ng/dL (ref 0.61–1.12)

## 2022-08-10 LAB — TSH: TSH: 1.605 u[IU]/mL (ref 0.350–4.500)

## 2022-08-11 LAB — T3: T3, Total: 110 ng/dL (ref 71–180)

## 2022-08-25 ENCOUNTER — Ambulatory Visit (INDEPENDENT_AMBULATORY_CARE_PROVIDER_SITE_OTHER): Payer: Medicare Other

## 2022-08-25 DIAGNOSIS — I495 Sick sinus syndrome: Secondary | ICD-10-CM

## 2022-08-25 LAB — CUP PACEART REMOTE DEVICE CHECK
Battery Remaining Longevity: 95 mo
Battery Voltage: 2.98 V
Brady Statistic AP VP Percent: 16.67 %
Brady Statistic AP VS Percent: 60.16 %
Brady Statistic AS VP Percent: 5.87 %
Brady Statistic AS VS Percent: 17.23 %
Brady Statistic RA Percent Paced: 54.45 %
Brady Statistic RV Percent Paced: 38.03 %
Date Time Interrogation Session: 20240524135141
Implantable Lead Connection Status: 753985
Implantable Lead Connection Status: 753985
Implantable Lead Implant Date: 20100625
Implantable Lead Implant Date: 20100625
Implantable Lead Location: 753859
Implantable Lead Location: 753862
Implantable Pulse Generator Implant Date: 20200129
Lead Channel Impedance Value: 266 Ohm
Lead Channel Impedance Value: 361 Ohm
Lead Channel Impedance Value: 380 Ohm
Lead Channel Impedance Value: 494 Ohm
Lead Channel Pacing Threshold Amplitude: 0.75 V
Lead Channel Pacing Threshold Amplitude: 1.875 V
Lead Channel Pacing Threshold Pulse Width: 0.4 ms
Lead Channel Pacing Threshold Pulse Width: 0.4 ms
Lead Channel Sensing Intrinsic Amplitude: 0.75 mV
Lead Channel Sensing Intrinsic Amplitude: 0.75 mV
Lead Channel Sensing Intrinsic Amplitude: 12 mV
Lead Channel Sensing Intrinsic Amplitude: 12 mV
Lead Channel Setting Pacing Amplitude: 1.5 V
Lead Channel Setting Pacing Amplitude: 3 V
Lead Channel Setting Pacing Pulse Width: 0.4 ms
Lead Channel Setting Sensing Sensitivity: 0.9 mV
Zone Setting Status: 755011
Zone Setting Status: 755011

## 2022-09-12 NOTE — Progress Notes (Signed)
Remote pacemaker transmission.   

## 2022-10-11 ENCOUNTER — Encounter: Payer: Self-pay | Admitting: Cardiology

## 2022-10-11 ENCOUNTER — Ambulatory Visit: Payer: Medicare Other | Attending: Cardiology | Admitting: Cardiology

## 2022-10-11 VITALS — BP 132/68 | HR 69 | Ht 62.0 in | Wt 118.4 lb

## 2022-10-11 DIAGNOSIS — I10 Essential (primary) hypertension: Secondary | ICD-10-CM | POA: Insufficient documentation

## 2022-10-11 DIAGNOSIS — I4821 Permanent atrial fibrillation: Secondary | ICD-10-CM | POA: Insufficient documentation

## 2022-10-11 DIAGNOSIS — Z950 Presence of cardiac pacemaker: Secondary | ICD-10-CM | POA: Diagnosis not present

## 2022-10-11 MED ORDER — AMLODIPINE BESYLATE 5 MG PO TABS: 5.0000 mg | ORAL_TABLET | Freq: Every day | ORAL | 1 refills | Status: DC

## 2022-10-11 NOTE — Patient Instructions (Signed)
Medication Instructions:  -Your physician has recommended you make the following change in your medication:   1) DECREASE Amlodipine to 5 mg: - take 1 tablet by mouth once daily   *If you need a refill on your cardiac medications before your next appointment, please call your pharmacy*   Lab Work: - none ordered  If you have labs (blood work) drawn today and your tests are completely normal, you will receive your results only by: MyChart Message (if you have MyChart) OR A paper copy in the mail If you have any lab test that is abnormal or we need to change your treatment, we will call you to review the results.   Testing/Procedures: 1) Echocardiogram  - Your physician has requested that you have an echocardiogram. Echocardiography is a painless test that uses sound waves to create images of your heart. It provides your doctor with information about the size and shape of your heart and how well your heart's chambers and valves are working. This procedure takes approximately one hour. There are no restrictions for this procedure. There is a possibility that an IV may need to be started during your test to inject an image enhancing agent. This is done to obtain more optimal pictures of your heart. Therefore we ask that you do at least drink some water prior to coming in to hydrate your veins.   Please do NOT wear cologne, perfume, aftershave, or lotions (deodorant is allowed). Please arrive 15 minutes prior to your appointment time.    Follow-Up: At [facility], you and your health needs are our priority.  As part of our continuing mission to provide you with exceptional heart care, we have created designated Provider Care Teams.  These Care Teams include your primary Cardiologist (physician) and Advanced Practice Providers (APPs -  Physician Assistants and Nurse Practitioners) who all work together to provide you with the care you need, when you need it.  We recommend signing  up for the patient portal called "MyChart".  Sign up information is provided on this After Visit Summary.  MyChart is used to connect with patients for Virtual Visits (Telemedicine).  Patients are able to view lab/test results, encounter notes, upcoming appointments, etc.  Non-urgent messages can be sent to your provider as well.   To learn more about what you can do with MyChart, go to [email or web address].    Your next appointment:   2 month(s)  Provider:   You may see Debbe Odea, MD or one of the following Advanced Practice Providers on your designated Care Team:   Nicolasa Ducking, NP Eula Listen, PA-C Cadence Fransico Michael, PA-C Charlsie Quest, NP    Other Instructions Echocardiogram An echocardiogram is a test that uses sound waves (ultrasound) to produce images of the heart. Images from an echocardiogram can provide important information about: Heart size and shape. The size and thickness and movement of your heart's walls. Heart muscle function and strength. Heart valve function or if you have stenosis. Stenosis is when the heart valves are too narrow. If blood is flowing backward through the heart valves (regurgitation). A tumor or infectious growth around the heart valves. Areas of heart muscle that are not working well because of poor blood flow or injury from a heart attack. Aneurysm detection. An aneurysm is a weak or damaged part of an artery wall. The wall bulges out from the normal force of blood pumping through the body. Tell a health care provider about: Any allergies you have. All medicines you  are taking, including vitamins, herbs, eye drops, creams, and over-the-counter medicines. Any blood disorders you have. Any surgeries you have had. Any medical conditions you have. Whether you are pregnant or may be pregnant. What are the risks? Generally, this is a safe test. However, problems may occur, including an allergic reaction to dye (contrast) that may be used  during the test. What happens before the test? No specific preparation is needed. You may eat and drink normally. What happens during the test?  You will take off your clothes from the waist up and put on a hospital gown. Electrodes or electrocardiogram (ECG)patches may be placed on your chest. The electrodes or patches are then connected to a device that monitors your heart rate and rhythm. You will lie down on a table for an ultrasound exam. A gel will be applied to your chest to help sound waves pass through your skin. A handheld device, called a transducer, will be pressed against your chest and moved over your heart. The transducer produces sound waves that travel to your heart and bounce back (or "echo" back) to the transducer. These sound waves will be captured in real-time and changed into images of your heart that can be viewed on a video monitor. The images will be recorded on a computer and reviewed by your health care provider. You may be asked to change positions or hold your breath for a short time. This makes it easier to get different views or better views of your heart. In some cases, you may receive contrast through an IV in one of your veins. This can improve the quality of the pictures from your heart. The procedure may vary among health care providers and hospitals. What can I expect after the test? You may return to your normal, everyday life, including diet, activities, and medicines, unless your health care provider tells you not to do that. Follow these instructions at home: It is up to you to get the results of your test. Ask your health care provider, or the department that is doing the test, when your results will be ready. Keep all follow-up visits. This is important. Summary An echocardiogram is a test that uses sound waves (ultrasound) to produce images of the heart. Images from an echocardiogram can provide important information about the size and shape of your  heart, heart muscle function, heart valve function, and other possible heart problems. You do not need to do anything to prepare before this test. You may eat and drink normally. After the echocardiogram is completed, you may return to your normal, everyday life, unless your health care provider tells you not to do that. This information is not intended to replace advice given to you by your health care provider. Make sure you discuss any questions you have with your health care provider. Document Revised: 12/01/2020 Document Reviewed: 11/11/2019 Elsevier Patient Education  2024 [employer].

## 2022-10-11 NOTE — Progress Notes (Signed)
Cardiology Office Note:    Date:  10/11/2022   ID:  [name], DOB [birth year], MRN [id number]  PCP:  Lynnea Ferrier, MD   [facility] Providers Cardiologist:  None Electrophysiologist:  Lanier Prude, MD     Referring MD: Lynnea Ferrier, MD   Chief Complaint  Patient presents with   New Patient (Initial Visit)    Patient currently followed by [facility] EP clinic.  Previously followed by [facility] and would like to transfer general cardiac care.  Concerns of recent edema with elevated bp.  PCP increased Amlodipine from 5 mg to 10 mg.  Increased Amlodipine dose is increasing fatigue.      History of Present Illness:    [name] is a 89 y.o. female with a hx of permanent A-fib, sick sinus syndrome, s/p pacemaker, hypertension who presents to establish care.  Deviously seen by [facility] cardiology from a cardiac perspective.  Previous cardiologist moved.  Was recently seen in the hospital due to elevated blood pressures over 3 days.  Followed up with PCP, Norvasc was increased from 5 mg to 10 mg.  She also takes Aldactone 25 mg daily.  Her blood pressures were previously normal.  She states feeling more fatigued, had some leg edema with increasing Norvasc.  Patient would like device checks for her pacemaker in [place].  Recently establish care with EP.  Past Medical History:  Diagnosis Date   Arthritis    Atrial fibrillation (HCC)    Cancer (HCC)    Basal cell many times on face   CHF (congestive heart failure) (HCC)    Coronary artery disease    Dyspnea    sob when walking stairs   Dysrhythmia    AF/SSS/NSR   Fibroid    Hypertension    Menopausal symptoms    Osteoporosis 2018   T score -2.8 stable/improved from prior DEXA   Polymyalgia (HCC)    Presence of permanent cardiac pacemaker     Past Surgical History:  Procedure Laterality Date   BASAL CELL CARCINOMA EXCISION     BREAST SURGERY Left 1958   Breast Bx   CARDIAC CATHETERIZATION   2005   CARDIAC ELECTROPHYSIOLOGY STUDY AND ABLATION     FLEXIBLE SIGMOIDOSCOPY N/A 10/27/2016   Procedure: FLEXIBLE SIGMOIDOSCOPY;  Surgeon: Wyline Mood, MD;  Location: [facility] ENDOSCOPY;  Service: Gastroenterology;  Laterality: N/A;   HYSTEROSCOPY     X 2   IMPLANTABLE CARDIOVERTER DEFIBRILLATOR (ICD) GENERATOR CHANGE N/A 05/01/2018   Procedure: PACEMAKER BATTERY CHANGE CURRENT=[place], NEW=MEDTRONIC;  Surgeon: Marcina Millard, MD;  Location: [facility] ORS;  Service: Cardiovascular;  Laterality: N/A;   INSERT / REPLACE / REMOVE PACEMAKER     SKIN BIOPSY     basal cell removed many times    Current Medications: Current Meds  Medication Sig   acetaminophen (TYLENOL) 500 MG tablet Take 500 mg by mouth every 6 (six) hours as needed for moderate pain.   amLODipine (NORVASC) 5 MG tablet Take 1 tablet (5 mg total) by mouth daily.   apixaban (ELIQUIS) 2.5 MG TABS tablet Take 1 tablet (2.5 mg total) by mouth 2 (two) times daily.   cyanocobalamin (VITAMIN B12) 1000 MCG/ML injection Inject 1,000 mcg into the muscle every 30 (thirty) days.   Melatonin 10 MG TABS Take 10 mg by mouth as needed.   spironolactone (ALDACTONE) 25 MG tablet Take 25 mg by mouth daily.   [DISCONTINUED] amLODipine (NORVASC) 10 MG tablet Take 10 mg  by mouth daily.     Allergies:   Moxifloxacin and Tobramycin   Social History   Socioeconomic History   Marital status: Widowed    Spouse name: Not on file   Number of children: Not on file   Years of education: Not on file   Highest education level: Not on file  Occupational History   Occupation: RN    Comment: retired  Tobacco Use   Smoking status: Never   Smokeless tobacco: Never  Vaping Use   Vaping Use: Never used  Substance and Sexual Activity   Alcohol use: Yes    Alcohol/week: 4.0 standard drinks of alcohol    Types: 4 Standard drinks or equivalent per week   Drug use: No   Sexual activity: Not Currently    Birth control/protection: Post-menopausal     Comment: 1st intercourse 73 yo-1 partner  Other Topics Concern   Not on file  Social History Narrative   Not on file   Social Determinants of Health   Financial Resource Strain: Not on file  Food Insecurity: Not on file  Transportation Needs: Not on file  Physical Activity: Not on file  Stress: Not on file  Social Connections: Not on file     Family History: The patient's family history includes Heart disease in her father and mother; Hypertension in her mother; Osteoporosis in her father.  ROS:   Please see the history of present illness.     All other systems reviewed and are negative.  EKGs/Labs/Other Studies Reviewed:    The following studies were reviewed today:  EKG Interpretation Date/Time:  Wednesday October 11 2022 12:10:13 EDT Ventricular Rate:  69 PR Interval:    QRS Duration:  102 QT Interval:  386 QTC Calculation: 413 R Axis:   -52  Text Interpretation: Atrial fibrillation Premature ventricular complexes Septal infarct , age undetermined Confirmed by Debbe Odea ([id number]) on 10/11/2022 12:20:37 PM    Recent Labs: 08/04/2022: ALT 8; B Natriuretic Peptide 183.8; BUN 14; Creatinine, Ser 0.79; Hemoglobin 13.6; Platelets 178; Potassium 4.2; Sodium 139 08/10/2022: TSH 1.605  Recent Lipid Panel No results found for: "CHOL", "TRIG", "HDL", "CHOLHDL", "VLDL", "LDLCALC", "LDLDIRECT"   Risk Assessment/Calculations:             Physical Exam:    VS:  BP 132/68 (BP Location: Left Arm, Patient Position: Sitting, Cuff Size: Normal)   Pulse 69   Ht 5\' 2"  (1.575 m)   Wt 118 lb 6.4 oz (53.7 kg)   SpO2 96%   BMI 21.66 kg/m     Wt Readings from Last 3 Encounters:  10/11/22 118 lb 6.4 oz (53.7 kg)  08/04/22 125 lb (56.7 kg)  05/17/22 129 lb (58.5 kg)     GEN:  Well nourished, well developed in no acute distress HEENT: Normal NECK: No JVD; No carotid bruits CARDIAC: RRR, no murmurs, rubs, gallops RESPIRATORY:  Clear to auscultation without rales, wheezing or  rhonchi  ABDOMEN: Soft, non-tender, non-distended MUSCULOSKELETAL:  No edema; No deformity  SKIN: Warm and dry NEUROLOGIC:  Alert and oriented x 3 PSYCHIATRIC:  Normal affect   ASSESSMENT:    1. Permanent atrial fibrillation (HCC)   2. Primary hypertension   3. Cardiac pacemaker in situ    PLAN:    In order of problems listed above:  Permanent atrial fibrillation, sick sinus syndrome.  Continue Eliquis 2.5 mg twice daily.  Obtain echocardiogram. Hypertension, BP controlled, symptoms of fatigue and some edema with increasing Norvasc to 10 mg  daily.  Reduce Norvasc to 5 mg, continue Aldactone 25 mg daily.  Given her age, systolic in 140s okay.  If BP/systolic persists over 150, will consider increasing Aldactone. Sick sinus syndrome s/p pacemaker.  Keep appointment with device clinic for checks.  Patient would like to have device checked in [place].  Follow-up after echo      Medication Adjustments/Labs and Tests Ordered: Current medicines are reviewed at length with the patient today.  Concerns regarding medicines are outlined above.  Orders Placed This Encounter  Procedures   EKG 12-Lead   ECHOCARDIOGRAM COMPLETE   Meds ordered this encounter  Medications   amLODipine (NORVASC) 5 MG tablet    Sig: Take 1 tablet (5 mg total) by mouth daily.    Dispense:  90 tablet    Refill:  1    Dose decrease    Patient Instructions  Medication Instructions:  -Your physician has recommended you make the following change in your medication:   1) DECREASE Amlodipine to 5 mg: - take 1 tablet by mouth once daily   *If you need a refill on your cardiac medications before your next appointment, please call your pharmacy*   Lab Work: - none ordered  If you have labs (blood work) drawn today and your tests are completely normal, you will receive your results only by: MyChart Message (if you have MyChart) OR A paper copy in the mail If you have any lab test that is abnormal or we  need to change your treatment, we will call you to review the results.   Testing/Procedures: 1) Echocardiogram  - Your physician has requested that you have an echocardiogram. Echocardiography is a painless test that uses sound waves to create images of your heart. It provides your doctor with information about the size and shape of your heart and how well your heart's chambers and valves are working. This procedure takes approximately one hour. There are no restrictions for this procedure. There is a possibility that an IV may need to be started during your test to inject an image enhancing agent. This is done to obtain more optimal pictures of your heart. Therefore we ask that you do at least drink some water prior to coming in to hydrate your veins.   Please do NOT wear cologne, perfume, aftershave, or lotions (deodorant is allowed). Please arrive 15 minutes prior to your appointment time.    Follow-Up: At [facility], you and your health needs are our priority.  As part of our continuing mission to provide you with exceptional heart care, we have created designated Provider Care Teams.  These Care Teams include your primary Cardiologist (physician) and Advanced Practice Providers (APPs -  Physician Assistants and Nurse Practitioners) who all work together to provide you with the care you need, when you need it.  We recommend signing up for the patient portal called "MyChart".  Sign up information is provided on this After Visit Summary.  MyChart is used to connect with patients for Virtual Visits (Telemedicine).  Patients are able to view lab/test results, encounter notes, upcoming appointments, etc.  Non-urgent messages can be sent to your provider as well.   To learn more about what you can do with MyChart, go to [email or web address].    Your next appointment:   2 month(s)  Provider:   You may see Debbe Odea, MD or one of the following Advanced Practice Providers on  your designated Care Team:   Nicolasa Ducking, NP Eula Listen, PA-C Cadence  Fransico Michael, PA-C Charlsie Quest, NP    Other Instructions Echocardiogram An echocardiogram is a test that uses sound waves (ultrasound) to produce images of the heart. Images from an echocardiogram can provide important information about: Heart size and shape. The size and thickness and movement of your heart's walls. Heart muscle function and strength. Heart valve function or if you have stenosis. Stenosis is when the heart valves are too narrow. If blood is flowing backward through the heart valves (regurgitation). A tumor or infectious growth around the heart valves. Areas of heart muscle that are not working well because of poor blood flow or injury from a heart attack. Aneurysm detection. An aneurysm is a weak or damaged part of an artery wall. The wall bulges out from the normal force of blood pumping through the body. Tell a health care provider about: Any allergies you have. All medicines you are taking, including vitamins, herbs, eye drops, creams, and over-the-counter medicines. Any blood disorders you have. Any surgeries you have had. Any medical conditions you have. Whether you are pregnant or may be pregnant. What are the risks? Generally, this is a safe test. However, problems may occur, including an allergic reaction to dye (contrast) that may be used during the test. What happens before the test? No specific preparation is needed. You may eat and drink normally. What happens during the test?  You will take off your clothes from the waist up and put on a hospital gown. Electrodes or electrocardiogram (ECG)patches may be placed on your chest. The electrodes or patches are then connected to a device that monitors your heart rate and rhythm. You will lie down on a table for an ultrasound exam. A gel will be applied to your chest to help sound waves pass through your skin. A handheld device, called a  transducer, will be pressed against your chest and moved over your heart. The transducer produces sound waves that travel to your heart and bounce back (or "echo" back) to the transducer. These sound waves will be captured in real-time and changed into images of your heart that can be viewed on a video monitor. The images will be recorded on a computer and reviewed by your health care provider. You may be asked to change positions or hold your breath for a short time. This makes it easier to get different views or better views of your heart. In some cases, you may receive contrast through an IV in one of your veins. This can improve the quality of the pictures from your heart. The procedure may vary among health care providers and hospitals. What can I expect after the test? You may return to your normal, everyday life, including diet, activities, and medicines, unless your health care provider tells you not to do that. Follow these instructions at home: It is up to you to get the results of your test. Ask your health care provider, or the department that is doing the test, when your results will be ready. Keep all follow-up visits. This is important. Summary An echocardiogram is a test that uses sound waves (ultrasound) to produce images of the heart. Images from an echocardiogram can provide important information about the size and shape of your heart, heart muscle function, heart valve function, and other possible heart problems. You do not need to do anything to prepare before this test. You may eat and drink normally. After the echocardiogram is completed, you may return to your normal, everyday life, unless your health care provider  tells you not to do that. This information is not intended to replace advice given to you by your health care provider. Make sure you discuss any questions you have with your health care provider. Document Revised: 12/01/2020 Document Reviewed: 11/11/2019 Elsevier  Patient Education  2024 [employer]     Signed, Debbe Odea, MD  10/11/2022 1:02 PM    [facility]

## 2022-11-08 ENCOUNTER — Ambulatory Visit: Payer: Medicare Other | Attending: Cardiology

## 2022-11-08 DIAGNOSIS — I4821 Permanent atrial fibrillation: Secondary | ICD-10-CM | POA: Diagnosis present

## 2022-11-20 ENCOUNTER — Telehealth: Payer: Self-pay | Admitting: *Deleted

## 2022-11-20 ENCOUNTER — Telehealth: Payer: Self-pay | Admitting: Hematology and Oncology

## 2022-11-20 NOTE — Telephone Encounter (Signed)
Spoke to patient to confirm upcoming morning [facility] clinic appointment on 8/28, paperwork will be sent via mail.   Gave location and time, also informed patient that the surgeon's office would be calling as well to get information from them similar to the packet that they will be receiving so make sure to do both.  Reminded patient that all providers will be coming to the clinic to see them HERE and if they had any questions to not hesitate to reach back out to myself or their navigators.

## 2022-11-20 NOTE — Telephone Encounter (Signed)
Called patient to confirm appointment for 8/21, patient stated she wouldn't be able to come Wednesday because she didn't have a ride and wanted to know if she could do a different day and also if she could do it closer to home, I told her I would consult with the team and see what we could do and I would get back with her and let her know

## 2022-11-24 ENCOUNTER — Ambulatory Visit (INDEPENDENT_AMBULATORY_CARE_PROVIDER_SITE_OTHER): Payer: Medicare Other

## 2022-11-24 DIAGNOSIS — I495 Sick sinus syndrome: Secondary | ICD-10-CM | POA: Diagnosis not present

## 2022-11-24 LAB — CUP PACEART REMOTE DEVICE CHECK
Battery Remaining Longevity: 89 mo
Battery Voltage: 2.98 V
Brady Statistic AP VP Percent: 36.86 %
Brady Statistic AP VS Percent: 38.8 %
Brady Statistic AS VP Percent: 6.31 %
Brady Statistic AS VS Percent: 17.98 %
Brady Statistic RA Percent Paced: 57.34 %
Brady Statistic RV Percent Paced: 51.84 %
Date Time Interrogation Session: 20240822203921
Implantable Lead Connection Status: 753985
Implantable Lead Connection Status: 753985
Implantable Lead Implant Date: 20100625
Implantable Lead Implant Date: 20100625
Implantable Lead Location: 753859
Implantable Lead Location: 753862
Implantable Pulse Generator Implant Date: 20200129
Lead Channel Impedance Value: 266 Ohm
Lead Channel Impedance Value: 361 Ohm
Lead Channel Impedance Value: 380 Ohm
Lead Channel Impedance Value: 494 Ohm
Lead Channel Pacing Threshold Amplitude: 0.625 V
Lead Channel Pacing Threshold Amplitude: 2.125 V
Lead Channel Pacing Threshold Pulse Width: 0.4 ms
Lead Channel Pacing Threshold Pulse Width: 0.4 ms
Lead Channel Sensing Intrinsic Amplitude: 1 mV
Lead Channel Sensing Intrinsic Amplitude: 1 mV
Lead Channel Sensing Intrinsic Amplitude: 10.25 mV
Lead Channel Sensing Intrinsic Amplitude: 10.25 mV
Lead Channel Setting Pacing Amplitude: 1.5 V
Lead Channel Setting Pacing Amplitude: 3.25 V
Lead Channel Setting Pacing Pulse Width: 0.4 ms
Lead Channel Setting Sensing Sensitivity: 0.9 mV
Zone Setting Status: 755011
Zone Setting Status: 755011

## 2022-11-27 ENCOUNTER — Encounter: Payer: Self-pay | Admitting: *Deleted

## 2022-11-27 DIAGNOSIS — C50411 Malignant neoplasm of upper-outer quadrant of right female breast: Secondary | ICD-10-CM

## 2022-11-27 NOTE — Progress Notes (Signed)
Radiation Oncology         [phone] ________________________________  Name: [name]        MRN: [id number]  Date of Service: 11/29/2022 DOB: [birth year]  WU:[id number], Viona Gilmore, MD  Almond Lint, MD     REFERRING PHYSICIAN: Almond Lint, MD   DIAGNOSIS: The encounter diagnosis was Malignant neoplasm of upper-outer quadrant of right breast in female, estrogen receptor positive (HCC).   HISTORY OF PRESENT ILLNESS: [name] is a 89 y.o. female seen in the multidisciplinary breast clinic for a new diagnosis of right breast cancer. The patient was noted to have screening detected mass in the right breast. Further diagnostic imaging showed a 1 cm mass in the 12:00 position with no abnormalities noted in the axilla by ultrasound. She underwent a biopsy that showed a grade 2 invasive ductal carcinoma that was ER/PR positive, HER2 negative with a Ki-67 of 15%. She's seen today to discuss treatment of her cancer.Marland Kitchen    PREVIOUS RADIATION THERAPY: No   PAST MEDICAL HISTORY:  Past Medical History:  Diagnosis Date   Arthritis    Atrial fibrillation (HCC)    Breast cancer (HCC)    Cancer (HCC)    Basal cell many times on face   CHF (congestive heart failure) (HCC)    Coronary artery disease    Dyspnea    sob when walking stairs   Dysrhythmia    AF/SSS/NSR   Fibroid    Hypertension    Menopausal symptoms    Osteoporosis 2018   T score -2.8 stable/improved from prior DEXA   Polymyalgia (HCC)    Presence of permanent cardiac pacemaker        PAST SURGICAL HISTORY: Past Surgical History:  Procedure Laterality Date   BASAL CELL CARCINOMA EXCISION     BREAST SURGERY Left 1958   Breast Bx   CARDIAC CATHETERIZATION  2005   CARDIAC ELECTROPHYSIOLOGY STUDY AND ABLATION     FLEXIBLE SIGMOIDOSCOPY N/A 10/27/2016   Procedure: FLEXIBLE SIGMOIDOSCOPY;  Surgeon: Wyline Mood, MD;  Location: [facility] ENDOSCOPY;  Service: Gastroenterology;  Laterality: N/A;   HYSTEROSCOPY     X  2   IMPLANTABLE CARDIOVERTER DEFIBRILLATOR (ICD) GENERATOR CHANGE N/A 05/01/2018   Procedure: PACEMAKER BATTERY CHANGE CURRENT=[facility] [facility], NEW=MEDTRONIC;  Surgeon: Marcina Millard, MD;  Location: [facility] ORS;  Service: Cardiovascular;  Laterality: N/A;   INSERT / REPLACE / REMOVE PACEMAKER     SKIN BIOPSY     basal cell removed many times     FAMILY HISTORY:  Family History  Problem Relation Age of Onset   Hypertension Mother    Heart disease Mother    Heart disease Father    Osteoporosis Father      SOCIAL HISTORY:  reports that she has never smoked. She has never used smokeless tobacco. She reports current alcohol use of about 4.0 standard drinks of alcohol per week. She reports that she does not use drugs. The patient is widowed and lives in [place]. She is accompanied by her daughter Jan. 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    ALLERGIES: Moxifloxacin and Tobramycin   MEDICATIONS:  Current Outpatient Medications  Medication Sig Dispense Refill   acetaminophen (TYLENOL) 500 MG tablet Take 500 mg by mouth every 6 (six) hours as needed for moderate pain.     amLODipine (NORVASC) 5 MG tablet Take 1 tablet (5 mg total) by mouth daily. 90 tablet 1   apixaban (ELIQUIS)  2.5 MG TABS tablet Take 1 tablet (2.5 mg total) by mouth 2 (two) times daily. 180 tablet 1   Biotin 1 MG CAPS Take 1 capsule by mouth daily. (Patient not taking: Reported on 10/11/2022)     chlorthalidone (HYGROTON) 25 MG tablet Take 25 mg by mouth daily.     cyanocobalamin (VITAMIN B12) 1000 MCG/ML injection Inject 1,000 mcg into the muscle every 30 (thirty) days.     Melatonin 10 MG TABS Take 10 mg by mouth as needed.     spironolactone (ALDACTONE) 25 MG tablet Take 25 mg by mouth daily.     No current facility-administered medications for this visit.     REVIEW OF SYSTEMS: On review of systems, the patient reports that she is doing well overall. She does have blurred  vision and is being treated for thyroid eye disease at [facility] with Dr. Westley Foots. No other complaints are verbalized.      PHYSICAL EXAM:  Wt Readings from Last 3 Encounters:  11/29/22 117 lb 3.2 oz (53.2 kg)  10/11/22 118 lb 6.4 oz (53.7 kg)  08/04/22 125 lb (56.7 kg)   Temp Readings from Last 3 Encounters:  11/29/22 (!) 97.5 F (36.4 C) (Tympanic)  08/04/22 98.1 F (36.7 C)  05/01/18 97.8 F (36.6 C) (Temporal)   BP Readings from Last 3 Encounters:  11/29/22 (!) 149/65  10/11/22 132/68  08/04/22 (!) 154/78   Pulse Readings from Last 3 Encounters:  11/29/22 82  10/11/22 69  08/04/22 60    In general this is a well appearing caucasian female in no acute distress. She's alert and oriented x4 and appropriate throughout the examination. Cardiopulmonary assessment is negative for acute distress and she exhibits normal effort. Bilateral breast exam is deferred.    ECOG = 0  0 - Asymptomatic (Fully active, able to carry on all predisease activities without restriction)  1 - Symptomatic but completely ambulatory (Restricted in physically strenuous activity but ambulatory and able to carry out work of a light or sedentary nature. For example, light housework, office work)  2 - Symptomatic, <50% in bed during the day (Ambulatory and capable of all self care but unable to carry out any work activities. Up and about more than 50% of waking hours)  3 - Symptomatic, >50% in bed, but not bedbound (Capable of only limited self-care, confined to bed or chair 50% or more of waking hours)  4 - Bedbound (Completely disabled. Cannot carry on any self-care. Totally confined to bed or chair)  5 - Death   Santiago Glad MM, Creech RH, Tormey DC, et al. [phone]). "Toxicity and response criteria of the [facility]". Am. Evlyn Clines. Oncol. 5 (6): 649-55    LABORATORY DATA:  Lab Results  Component Value Date   WBC 6.0 11/29/2022   HGB 13.9 11/29/2022   HCT 42.3 11/29/2022   MCV  98.1 11/29/2022   PLT 153 11/29/2022   Lab Results  Component Value Date   NA 142 11/29/2022   K 4.7 11/29/2022   CL 104 11/29/2022   CO2 30 11/29/2022   Lab Results  Component Value Date   ALT 8 11/29/2022   AST 14 (L) 11/29/2022   ALKPHOS 53 11/29/2022   BILITOT 1.1 11/29/2022      RADIOGRAPHY: CUP PACEART REMOTE DEVICE CHECK  Result Date: 11/24/2022 Scheduled remote reviewed. Normal device function.  RV threshold 2.125V @ 0.72ms, consistent with trends 5 NSVT, 8-15 beats, 2 likely atrial driven 1:1.  Episode [phone]  likely retrograde VT, 8sec in duration, HR 173 1 SVT, Hx of PAF, burden 24.5%, longest duration 18sec Next remote 91 days. LA, CVRS  ECHOCARDIOGRAM COMPLETE  Result Date: 11/09/2022    ECHOCARDIOGRAM REPORT   Patient Name:   WILLE WAH Date of Exam: 11/08/2022 Medical Rec #:  [id number]         Height:       62.0 in Accession #:    [id number]        Weight:       118.4 lb Date of Birth:  04-20-1926          BSA:          1.530 m Patient Age:    89 years          BP:           132/68 mmHg Patient Gender: F                 HR:           64 bpm. Exam Location:  [place] Procedure: 2D Echo, Cardiac Doppler, Color Doppler and Strain Analysis Indications:    I48.91* Unspeicified atrial fibrillation  History:        Patient has no prior history of Echocardiogram examinations.                 Pacemaker, Arrythmias:Atrial Fibrillation,                 Signs/Symptoms:Dizziness/Lightheadedness and Edema; Risk                 Factors:Hypertension and Dyslipidemia.  Sonographer:    Ilda Mori RDCS,MHA Referring Phys: [id number] BRIAN AGBOR-ETANG IMPRESSIONS  1. Left ventricular ejection fraction, by estimation, is 55 to 60%. The left ventricle has normal function. The left ventricle has no regional wall motion abnormalities. There is severe asymmetric left ventricular hypertrophy of the basal-septal segment. Left ventricular diastolic parameters are indeterminate. The average left  ventricular global longitudinal strain is -15.2 %. The global longitudinal strain is abnormal.  2. Right ventricular systolic function is mildly reduced. The right ventricular size is normal. There is normal pulmonary artery systolic pressure.  3. Left atrial size was severely dilated.  4. The mitral valve is normal in structure. Mild mitral valve regurgitation. No evidence of mitral stenosis. Moderate mitral annular calcification.  5. Tricuspid valve regurgitation is moderate.  6. The aortic valve is tricuspid. There is mild thickening of the aortic valve. Aortic valve regurgitation is mild to moderate.  7. There is borderline dilatation of the ascending aorta, measuring 38 mm.  8. The inferior vena cava is normal in size with greater than 50% respiratory variability, suggesting right atrial pressure of 3 mmHg. FINDINGS  Left Ventricle: Left ventricular ejection fraction, by estimation, is 55 to 60%. The left ventricle has normal function. The left ventricle has no regional wall motion abnormalities. The average left ventricular global longitudinal strain is -15.2 %. The global longitudinal strain is abnormal. The left ventricular internal cavity size was normal in size. There is severe asymmetric left ventricular hypertrophy of the basal-septal segment. Left ventricular diastolic parameters are indeterminate. Right Ventricle: The right ventricular size is normal. No increase in right ventricular wall thickness. Right ventricular systolic function is mildly reduced. There is normal pulmonary artery systolic pressure. The tricuspid regurgitant velocity is 2.52 m/s, and with an assumed right atrial pressure of 3 mmHg, the estimated right ventricular systolic pressure is 28.4 mmHg. Left Atrium: Left  atrial size was severely dilated. Right Atrium: Right atrial size was normal in size. Pericardium: There is no evidence of pericardial effusion. Mitral Valve: The mitral valve is normal in structure. Moderate mitral  annular calcification. Mild mitral valve regurgitation. No evidence of mitral valve stenosis. Tricuspid Valve: The tricuspid valve is normal in structure. Tricuspid valve regurgitation is moderate. Aortic Valve: The aortic valve is tricuspid. There is mild thickening of the aortic valve. Aortic valve regurgitation is mild to moderate. Aortic regurgitation PHT measures 637 msec. Aortic valve mean gradient measures 6.7 mmHg. Aortic valve peak gradient measures 12.5 mmHg. Pulmonic Valve: The pulmonic valve was normal in structure. Pulmonic valve regurgitation is not visualized. No evidence of pulmonic stenosis. Aorta: The aortic root is normal in size and structure. There is borderline dilatation of the ascending aorta, measuring 38 mm. Pulmonary Artery: The pulmonary artery is of normal size. Venous: The inferior vena cava is normal in size with greater than 50% respiratory variability, suggesting right atrial pressure of 3 mmHg. IAS/Shunts: No atrial level shunt detected by color flow Doppler. Additional Comments: A device lead is visualized in the right ventricle.  LEFT VENTRICLE PLAX 2D LVIDd:         3.50 cm LVIDs:         3.10 cm 2D Longitudinal Strain LV PW:         0.90 cm 2D Strain GLS Avg:     -15.2 % LV IVS:        1.60 cm  RIGHT VENTRICLE RV Basal diam:  3.10 cm RV Mid diam:    1.80 cm RV S prime:     9.79 cm/s TAPSE (M-mode): 1.5 cm LEFT ATRIUM             Index        RIGHT ATRIUM           Index LA diam:        4.00 cm 2.61 cm/m   RA Area:     15.00 cm LA Vol (A2C):   77.1 ml 50.40 ml/m  RA Volume:   35.60 ml  23.27 ml/m LA Vol (A4C):   86.4 ml 56.48 ml/m LA Biplane Vol: 86.8 ml 56.74 ml/m  AORTIC VALVE AV Vmax:           176.46 cm/s AV Vmean:          121.698 cm/s AV VTI:            0.330 m AV Peak Grad:      12.5 mmHg AV Mean Grad:      6.7 mmHg LVOT Vmax:         82.28 cm/s LVOT Vmean:        49.226 cm/s LVOT VTI:          0.120 m LVOT/AV VTI ratio: 0.36 AI PHT:            637 msec  AORTA Ao  Root diam: 3.10 cm Ao Asc diam:  3.80 cm TRICUSPID VALVE TR Peak grad:   25.4 mmHg TR Vmax:        252.00 cm/s  SHUNTS Systemic VTI: 0.12 m Yvonne Kendall MD Electronically signed by Yvonne Kendall MD Signature Date/Time: 11/09/2022/1:08:56 PM    Final        IMPRESSION/PLAN: 1. Stage IA, [id number], grade 2, ER/PR positive invasive ductal carcinoma of the right breast. Dr. Mitzi Hansen discusses the pathology findings and reviews the nature of early stage breast disease. The consensus from the  breast conference includes breast conservation with lumpectomy. Dr. Mitzi Hansen discusses the role of external radiotherapy to the breast  to reduce risks of local recurrence followed by antiestrogen therapy. Dr. Mitzi Hansen also discusses cases in which radiation may be optional for favorable cases based on final pathology.  We will follow-up with her final results of surgery, but if recommended Dr. Mitzi Hansen would anticipate a course of 4 weeks of radiotherapy to the right breast.  We discussed the risks, benefits, short, and long term effects of radiotherapy, as well as the curative intent, and if radiation is recommended, he would consider an ultrahypofractionated course; we will see her back a few weeks after surgery to discuss the simulation process and anticipate we starting radiotherapy about 4-6 weeks after surgery.  2. Possible genetic predisposition to malignancy. The patient is a candidate for genetic testing given her personal and family history. She will meet with our geneticist today in clinic. 3.  In Situ Pacemaker. We will reach out to the device clinic in advance for approval in case the patient proceeds with radiation if treatment is necessary.  In a visit lasting 60 minutes, greater than 50% of the time was spent face to face reviewing her case, as well as in preparation of, discussing, and coordinating the patient's care.  The above documentation reflects my direct findings during this shared patient visit. Please see  the separate note by Dr. Mitzi Hansen on this date for the remainder of the patient's plan of care.    Osker Mason, [facility]    **Disclaimer: This note was dictated with voice recognition software. Similar sounding words can inadvertently be transcribed and this note may contain transcription errors which may not have been corrected upon publication of note.**

## 2022-11-29 ENCOUNTER — Telehealth: Payer: Self-pay | Admitting: Cardiology

## 2022-11-29 ENCOUNTER — Other Ambulatory Visit: Payer: Self-pay | Admitting: General Surgery

## 2022-11-29 ENCOUNTER — Inpatient Hospital Stay: Payer: Medicare Other | Attending: Hematology and Oncology

## 2022-11-29 ENCOUNTER — Inpatient Hospital Stay (HOSPITAL_BASED_OUTPATIENT_CLINIC_OR_DEPARTMENT_OTHER): Payer: Medicare Other | Admitting: Hematology and Oncology

## 2022-11-29 ENCOUNTER — Encounter: Payer: Self-pay | Admitting: Hematology and Oncology

## 2022-11-29 ENCOUNTER — Ambulatory Visit
Admission: RE | Admit: 2022-11-29 | Discharge: 2022-11-29 | Disposition: A | Discharge status: Home / Self Care | Payer: Medicare Other | Source: Ambulatory Visit | Attending: Radiation Oncology | Admitting: Radiation Oncology

## 2022-11-29 ENCOUNTER — Inpatient Hospital Stay: Payer: Medicare Other | Admitting: Genetic Counselor

## 2022-11-29 ENCOUNTER — Ambulatory Visit: Payer: Medicare Other | Admitting: Physical Therapy

## 2022-11-29 VITALS — BP 149/65 | HR 82 | Temp 97.5°F | Resp 18 | Ht 62.0 in | Wt 117.2 lb

## 2022-11-29 DIAGNOSIS — I4891 Unspecified atrial fibrillation: Secondary | ICD-10-CM

## 2022-11-29 DIAGNOSIS — Z170 Estrogen receptor positive status [ER+]: Secondary | ICD-10-CM | POA: Insufficient documentation

## 2022-11-29 DIAGNOSIS — C50411 Malignant neoplasm of upper-outer quadrant of right female breast: Secondary | ICD-10-CM

## 2022-11-29 DIAGNOSIS — Z79899 Other long term (current) drug therapy: Secondary | ICD-10-CM | POA: Diagnosis not present

## 2022-11-29 DIAGNOSIS — Z950 Presence of cardiac pacemaker: Secondary | ICD-10-CM | POA: Insufficient documentation

## 2022-11-29 DIAGNOSIS — M609 Myositis, unspecified: Secondary | ICD-10-CM | POA: Diagnosis not present

## 2022-11-29 LAB — CMP (CANCER CENTER ONLY)
ALT: 8 U/L (ref 0–44)
AST: 14 U/L — ABNORMAL LOW (ref 15–41)
Albumin: 4.1 g/dL (ref 3.5–5.0)
Alkaline Phosphatase: 53 U/L (ref 38–126)
Anion gap: 8 (ref 5–15)
BUN: 23 mg/dL (ref 8–23)
CO2: 30 mmol/L (ref 22–32)
Calcium: 10.2 mg/dL (ref 8.9–10.3)
Chloride: 104 mmol/L (ref 98–111)
Creatinine: 0.83 mg/dL (ref 0.44–1.00)
GFR, Estimated: >60 mL/min (ref >60)
Glucose, Bld: 96 mg/dL (ref 70–99)
Potassium: 4.7 mmol/L (ref 3.5–5.1)
Sodium: 142 mmol/L (ref 135–145)
Total Bilirubin: 1.1 mg/dL (ref 0.3–1.2)
Total Protein: 7.2 g/dL (ref 6.5–8.1)

## 2022-11-29 LAB — CBC WITH DIFFERENTIAL (CANCER CENTER ONLY)
Abs Immature Granulocytes: 0.01 10*3/uL (ref 0.00–0.07)
Basophils Absolute: 0 10*3/uL (ref 0.0–0.1)
Basophils Relative: 1 %
Eosinophils Absolute: 0.2 10*3/uL (ref 0.0–0.5)
Eosinophils Relative: 3 %
HCT: 42.3 % (ref 36.0–46.0)
Hemoglobin: 13.9 g/dL (ref 12.0–15.0)
Immature Granulocytes: 0 %
Lymphocytes Relative: 31 %
Lymphs Abs: 1.9 10*3/uL (ref 0.7–4.0)
MCH: 32.3 pg (ref 26.0–34.0)
MCHC: 32.9 g/dL (ref 30.0–36.0)
MCV: 98.1 fL (ref 80.0–100.0)
Monocytes Absolute: 0.8 10*3/uL (ref 0.1–1.0)
Monocytes Relative: 14 %
Neutro Abs: 3.1 10*3/uL (ref 1.7–7.7)
Neutrophils Relative %: 51 %
Platelet Count: 153 10*3/uL (ref 150–400)
RBC: 4.31 MIL/uL (ref 3.87–5.11)
RDW: 14.4 % (ref 11.5–15.5)
WBC Count: 6 10*3/uL (ref 4.0–10.5)
nRBC: 0 % (ref 0.0–0.2)

## 2022-11-29 LAB — GENETIC SCREENING ORDER

## 2022-11-29 NOTE — Progress Notes (Signed)
[facility] CONSULT NOTE  Patient Care Team: Lynnea Ferrier, MD as PCP - General Lanier Prude, MD as PCP - Electrophysiology (Cardiology) Donnelly Angelica, RN as Oncology Nurse Navigator Pershing Proud, RN as Oncology Nurse Navigator Almond Lint, MD as Consulting Physician (General Surgery) Rachel Moulds, MD as Consulting Physician (Hematology and Oncology) Dorothy Puffer, MD as Consulting Physician (Radiation Oncology)  CHIEF COMPLAINTS/PURPOSE OF CONSULTATION:  Newly diagnosed breast cancer  HISTORY OF PRESENTING ILLNESS:  [name] 89 y.o. female is here because of recent diagnosis of right breast cancer  I reviewed her records extensively and collaborated the history with the patient.  SUMMARY OF ONCOLOGIC HISTORY: Oncology History  Malignant neoplasm of upper-outer quadrant of right breast in female, estrogen receptor positive (HCC)  11/01/2022 Mammogram   Screening mammogram with no indeterminate asymmetry right breast. Additional views with possible ultrasound are recommended.  Ultrasound showed 6 x 8 mm irregular mass in the right breast which was thought to be indeterminate, biopsy recommended   11/15/2022 Pathology Results   Right breast needle core biopsy at 12:00 showed grade 2 invasive mammary carcinoma, prognostic showed ER 100% positive strong staining, PR 60% positive moderate to strong staining intensity group 5 HER2 negative and Ki-67 of 15%   11/27/2022 Initial Diagnosis   Malignant neoplasm of upper-outer quadrant of right breast in female, estrogen receptor positive (HCC)   11/29/2022 Cancer Staging   Staging form: Breast, AJCC 8th Edition - Clinical stage from 11/29/2022: Stage IA (cT1b, cN0, cM0, G2, ER+, PR+, HER2-) - Signed by Ronny Bacon, PA-C on 11/29/2022 Stage prefix: Initial diagnosis Method of lymph node assessment: Clinical Histologic grading system: 3 grade system    Patient arrived to the appointment today  with her daughter.  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 For the same, She has double vision related to the myositis which has been slowly improving.  She is pretty independent during the day but usually has her kids nighttime. Other than the myositis, she has A-fib and has been on anticoagulation, also has a pacer in place.  Rest of the pertinent 10 point reviewed and negative  MEDICAL HISTORY:  Past Medical History:  Diagnosis Date   Arthritis    Atrial fibrillation (HCC)    Breast cancer (HCC)    Cancer (HCC)    Basal cell many times on face   CHF (congestive heart failure) (HCC)    Coronary artery disease    Dyspnea    sob when walking stairs   Dysrhythmia    AF/SSS/NSR   Fibroid    Hypertension    Menopausal symptoms    Osteoporosis 2018   T score -2.8 stable/improved from prior DEXA   Polymyalgia (HCC)    Presence of permanent cardiac pacemaker     SURGICAL HISTORY: Past Surgical History:  Procedure Laterality Date   BASAL CELL CARCINOMA EXCISION     BREAST SURGERY Left 1958   Breast Bx   CARDIAC CATHETERIZATION  2005   CARDIAC ELECTROPHYSIOLOGY STUDY AND ABLATION     FLEXIBLE SIGMOIDOSCOPY N/A 10/27/2016   Procedure: FLEXIBLE SIGMOIDOSCOPY;  Surgeon: Wyline Mood, MD;  Location: [facility] ENDOSCOPY;  Service: Gastroenterology;  Laterality: N/A;   HYSTEROSCOPY     X 2   IMPLANTABLE CARDIOVERTER DEFIBRILLATOR (ICD) GENERATOR CHANGE N/A 05/01/2018   Procedure: PACEMAKER BATTERY CHANGE CURRENT=ST. JUDE, NEW=MEDTRONIC;  Surgeon: Marcina Millard, MD;  Location: [facility] ORS;  Service: Cardiovascular;  Laterality: N/A;   INSERT / REPLACE / REMOVE PACEMAKER     SKIN BIOPSY     basal cell removed many times    SOCIAL HISTORY: Social History   Socioeconomic History   Marital status: Widowed    Spouse name: Not on file   Number of children: Not on file   Years of education: Not on file   Highest education  level: Not on file  Occupational History   Occupation: RN    Comment: retired  Tobacco Use   Smoking status: Never   Smokeless tobacco: Never  Vaping Use   Vaping status: Never Used  Substance and Sexual Activity   Alcohol use: Yes    Alcohol/week: 4.0 standard drinks of alcohol    Types: 4 Standard drinks or equivalent per week   Drug use: No   Sexual activity: Not Currently    Birth control/protection: Post-menopausal    Comment: 1st intercourse 69 yo-1 partner  Other Topics Concern   Not on file  Social History Narrative   Not on file   Social Determinants of Health   Financial Resource Strain: Low Risk  (10/20/2022)   Received from [facility]   Overall Financial Resource Strain (CARDIA)    Difficulty of Paying Living Expenses: Not hard at all  Food Insecurity: No Food Insecurity (10/20/2022)   Received from [facility]   Hunger Vital Sign    Worried About Running Out of Food in the Last Year: Never true    Ran Out of Food in the Last Year: Never true  Transportation Needs: No Transportation Needs (10/20/2022)   Received from [facility] - Transportation    In the past 12 months, has lack of transportation kept you from medical appointments or from getting medications?: No    Lack of Transportation (Non-Medical): No  Physical Activity: Not on file  Stress: Not on file  Social Connections: Not on file  Intimate Partner Violence: Not on file    FAMILY HISTORY: Family History  Problem Relation Age of Onset   Hypertension Mother    Heart disease Mother    Heart disease Father    Osteoporosis Father     ALLERGIES:  is allergic to moxifloxacin and tobramycin.  MEDICATIONS:  Current Outpatient Medications  Medication Sig Dispense Refill   amLODipine (NORVASC) 5 MG tablet Take 1 tablet (5 mg total) by mouth daily. 90 tablet 1   apixaban (ELIQUIS) 2.5 MG TABS tablet Take 1 tablet (2.5 mg total) by mouth  2 (two) times daily. 180 tablet 1   chlorthalidone (HYGROTON) 25 MG tablet Take 25 mg by mouth daily.     cyanocobalamin (VITAMIN B12) 1000 MCG/ML injection Inject 1,000 mcg into the muscle every 30 (thirty) days.     acetaminophen (TYLENOL) 500 MG tablet Take 500 mg by mouth every 6 (six) hours as needed for moderate pain.     Biotin 1 MG CAPS Take 1 capsule by mouth daily. (Patient not taking: Reported on 10/11/2022)     Melatonin 10 MG TABS Take 10 mg by mouth as needed.     spironolactone (ALDACTONE) 25 MG tablet Take 25 mg by mouth daily.     No current facility-administered medications for this visit.    REVIEW OF SYSTEMS:   Constitutional: Denies fevers, chills or abnormal night sweats Eyes: Denies blurriness of vision, double vision or watery eyes Ears, nose, mouth, throat, and face: Denies mucositis  or sore throat Respiratory: Denies cough, dyspnea or wheezes Cardiovascular: Denies palpitation, chest discomfort or lower extremity swelling Gastrointestinal:  Denies nausea, heartburn or change in bowel habits Skin: Denies abnormal skin rashes Lymphatics: Denies new lymphadenopathy or easy bruising Neurological:Denies numbness, tingling or new weaknesses Behavioral/Psych: Mood is stable, no new changes  Breast: Denies any palpable lumps or discharge All other systems were reviewed with the patient and are negative.  PHYSICAL EXAMINATION: ECOG PERFORMANCE STATUS: 0 - Asymptomatic  Vitals:   11/29/22 0841  BP: (!) 149/65  Pulse: 82  Resp: 18  Temp: (!) 97.5 F (36.4 C)  SpO2: 100%   Filed Weights   11/29/22 0841  Weight: 117 lb 3.2 oz (53.2 kg)    GENERAL:alert, no distress and comfortable SKIN: skin color, texture, turgor are normal, no rashes or significant lesions EYES: normal, conjunctiva are pink and non-injected, sclera clear OROPHARYNX:no exudate, no erythema and lips, buccal mucosa, and tongue normal  NECK: supple, thyroid normal size, non-tender, without  nodularity LYMPH:  no palpable lymphadenopathy in the cervical, axillary LUNGS: clear to auscultation and percussion with normal breathing effort HEART: regular rate & rhythm and no murmurs and no lower extremity edema ABDOMEN:abdomen soft, non-tender and normal bowel sounds Musculoskeletal:no cyanosis of digits and no clubbing  PSYCH: alert & oriented x 3 with fluent speech NEURO: no focal motor/sensory deficits BREAST: Small palpable right breast mass at 12:00 measuring approximately 2 cm.  No palpable regional lymph nodes  LABORATORY DATA:  I have reviewed the data as listed Lab Results  Component Value Date   WBC 6.0 11/29/2022   HGB 13.9 11/29/2022   HCT 42.3 11/29/2022   MCV 98.1 11/29/2022   PLT 153 11/29/2022   Lab Results  Component Value Date   NA 142 11/29/2022   K 4.7 11/29/2022   CL 104 11/29/2022   CO2 30 11/29/2022    RADIOGRAPHIC STUDIES: I have personally reviewed the radiological reports and agreed with the findings in the report.  ASSESSMENT AND PLAN:  Malignant neoplasm of upper-outer quadrant of right breast in female, estrogen receptor positive (HCC) This is a very pleasant 89 year old female patient with newly diagnosed right breast invasive ductal carcinoma, grade 2, ER/PR positive HER2 negative referred to breast MDC for additional recommendations.  Patient arrived to the appointment today with her daughter.  She is a robust 89 mostly limited by her myositis and double vision, otherwise tries to stay active and independent as best as she can.  She continues on anticoagulation for atrial fibrillation.  She is friends with Dr. Werner Lean at [facility] and was hoping that we will share information with Dr. Caswell Corwin about the recommendations.  On physical exam there is a small mass in the right breast at 12 o'clock position measuring approximately 2 cm, no palpable regional lymph nodes.  Given intermediate grade, strong ER/PR positivity, small tumor, node negativity  she will proceed with upfront surgery.  I have not offered Oncotype DX testing given her advanced age and other comorbidities.  I also do not see a strong role for adjuvant chemotherapy in this situation.  She however may be able to tolerate adjuvant antiestrogen therapy.  We have focused our discussion today on tamoxifen as well as aromatase inhibitors.  We have reviewed the mechanism of action, adverse effects with each class.  We have discussed the risk of DVT/PE with tamoxifen, endometrial hyperplasia and rarely endometrial malignancy, postmenopausal symptoms with tamoxifen.  With aromatase inhibitors, she will be at risk for  bone density loss as well as some postmenopausal symptoms such as hot flashes, vaginal dryness, arthralgias.  At this time she is not quite unsure of the adjuvant therapies.  She wants to proceed with surgery first and then come back for follow-up for additional discussion.  She is agreeable to omitting Oncotype.  Thank you for consulting [place] in the care of this patient.  Please do not hesitate to contact us with any additional questions or concerns.   All questions were answered. The patient knows to call the clinic with any problems, questions or concerns.    Rachel Moulds, MD 11/29/22

## 2022-11-29 NOTE — Progress Notes (Signed)
Remote pacemaker transmission.   

## 2022-11-29 NOTE — Telephone Encounter (Signed)
Please advise holding Eliquis prior to right breat lumpectomy.   Thank you!  DW

## 2022-11-29 NOTE — Telephone Encounter (Signed)
Patient with diagnosis of afib on Eliquis for anticoagulation.    Procedure: right breast lumpectomy Date of procedure: TBD  CHA2DS2-VASc Score = 6  This indicates a 9.7% annual risk of stroke. The patient's score is based upon: CHF History: 1 HTN History: 1 Diabetes History: 0 Stroke History: 0 Vascular Disease History: 1 Age Score: 2 Gender Score: 1   CrCl 24mL/min Platelet count 153K  Per office protocol, patient can hold Eliquis for 2-3 days prior to procedure.    **This guidance is not considered finalized until pre-operative APP has relayed final recommendations.**

## 2022-11-29 NOTE — Assessment & Plan Note (Signed)
This is a very pleasant 89 year old female patient with newly diagnosed right breast invasive ductal carcinoma, grade 2, ER/PR positive HER2 negative referred to breast MDC for additional recommendations.  Patient arrived to the appointment today with her daughter.  She is a robust 89 mostly limited by her myositis and double vision, otherwise tries to stay active and independent as best as she can.  She continues on anticoagulation for atrial fibrillation.  She is friends with Dr. Werner Lean at [facility] and was hoping that we will share information with Dr. Caswell Corwin about the recommendations.  On physical exam there is a small mass in the right breast at 12 o'clock position measuring approximately 2 cm, no palpable regional lymph nodes.  Given intermediate grade, strong ER/PR positivity, small tumor, node negativity she will proceed with upfront surgery.  I have not offered Oncotype DX testing given her advanced age and other comorbidities.  I also do not see a strong role for adjuvant chemotherapy in this situation.  She however may be able to tolerate adjuvant antiestrogen therapy.  We have focused our discussion today on tamoxifen as well as aromatase inhibitors.  We have reviewed the mechanism of action, adverse effects with each class.  We have discussed the risk of DVT/PE with tamoxifen, endometrial hyperplasia and rarely endometrial malignancy, postmenopausal symptoms with tamoxifen.  With aromatase inhibitors, she will be at risk for bone density loss as well as some postmenopausal symptoms such as hot flashes, vaginal dryness, arthralgias.  At this time she is not quite unsure of the adjuvant therapies.  She wants to proceed with surgery first and then come back for follow-up for additional discussion.  She is agreeable to omitting Oncotype.  Thank you for consulting [place] in the care of this patient.  Please do not hesitate to contact us with any additional questions or concerns.

## 2022-11-29 NOTE — Telephone Encounter (Signed)
   Pre-operative Risk Assessment    Patient Name: [name]  DOB: May 12, 1926 MRN: [id number]      Request for Surgical Clearance    Procedure:   right breast lumpectomy  Date of Surgery:  Clearance TBD Urgent                                 Surgeon:  Armond Hang Surgeon's Group or Practice Name:  [facility] surgery Phone number:  [phone] Fax number:  [phone]   Type of Clearance Requested:   - Pharmacy:  Hold Apixaban (Eliquis) hold medication preoperatively   Type of Anesthesia:  General    Additional requests/questions:    Burnett Sheng   11/29/2022, 1:48 PM

## 2022-11-30 NOTE — Progress Notes (Signed)
REFERRING PROVIDER: Rachel Moulds, MD [address] [place],  [place] [address]  PRIMARY PROVIDER:  Lynnea Ferrier, MD  PRIMARY REASON FOR VISIT:  1. Malignant neoplasm of upper-outer quadrant of right breast in female, estrogen receptor positive (HCC)     HISTORY OF PRESENT ILLNESS:   [name], a 89 y.o. female, was seen for a [facility] genetics consultation at the request of Dr. Al Pimple due to a personal history of breast cancer.  [name] presents to clinic today to discuss the possibility of a hereditary predisposition to cancer, to discuss genetic testing, and to further clarify her future cancer risks, as well as potential cancer risks for family members.   In August 2024, at the age of 65, [name] was diagnosed with invasive mammary carcinoma of the right breast (ER+/PR+/HER2-). The treatment plan is pending.   She also reports a history of both basal cell and squamous cell skin cancers.   CANCER HISTORY:  Oncology History  Malignant neoplasm of upper-outer quadrant of right breast in female, estrogen receptor positive (HCC)  11/01/2022 Mammogram   Screening mammogram with no indeterminate asymmetry right breast. Additional views with possible ultrasound are recommended.  Ultrasound showed 6 x 8 mm irregular mass in the right breast which was thought to be indeterminate, biopsy recommended   11/15/2022 Pathology Results   Right breast needle core biopsy at 12:00 showed grade 2 invasive mammary carcinoma, prognostic showed ER 100% positive strong staining, PR 60% positive moderate to strong staining intensity group 5 HER2 negative and Ki-67 of 15%   11/27/2022 Initial Diagnosis   Malignant neoplasm of upper-outer quadrant of right breast in female, estrogen receptor positive (HCC)   11/29/2022 Cancer Staging   Staging form: Breast, AJCC 8th Edition - Clinical stage from 11/29/2022: Stage IA (cT1b, cN0, cM0, G2, ER+, PR+, HER2-) - Signed by Ronny Bacon,  PA-C on 11/29/2022 Stage prefix: Initial diagnosis Method of lymph node assessment: Clinical Histologic grading system: 3 grade system     Past Medical History:  Diagnosis Date   Arthritis    Atrial fibrillation (HCC)    Breast cancer (HCC)    Cancer (HCC)    Basal cell many times on face   CHF (congestive heart failure) (HCC)    Coronary artery disease    Dyspnea    sob when walking stairs   Dysrhythmia    AF/SSS/NSR   Fibroid    Hypertension    Menopausal symptoms    Osteoporosis 2018   T score -2.8 stable/improved from prior DEXA   Polymyalgia (HCC)    Presence of permanent cardiac pacemaker     Past Surgical History:  Procedure Laterality Date   BASAL CELL CARCINOMA EXCISION     BREAST SURGERY Left 1958   Breast Bx   CARDIAC CATHETERIZATION  2005   CARDIAC ELECTROPHYSIOLOGY STUDY AND ABLATION     FLEXIBLE SIGMOIDOSCOPY N/A 10/27/2016   Procedure: FLEXIBLE SIGMOIDOSCOPY;  Surgeon: Wyline Mood, MD;  Location: [facility] ENDOSCOPY;  Service: Gastroenterology;  Laterality: N/A;   HYSTEROSCOPY     X 2   IMPLANTABLE CARDIOVERTER DEFIBRILLATOR (ICD) GENERATOR CHANGE N/A 05/01/2018   Procedure: PACEMAKER BATTERY CHANGE CURRENT=ST. JUDE, NEW=MEDTRONIC;  Surgeon: Marcina Millard, MD;  Location: [facility] ORS;  Service: Cardiovascular;  Laterality: N/A;   INSERT / REPLACE / REMOVE PACEMAKER     SKIN BIOPSY     basal cell removed many times     FAMILY HISTORY:  We obtained a 4-generation family  history.  She did not report a known family history of cancer.      [name] is unaware of previous family history of genetic testing for hereditary cancer risks. There is no reported Ashkenazi Jewish ancestry. There is no known consanguinity.  GENETIC COUNSELING ASSESSMENT: [name] is a 89 y.o. female with a personal history which is not highly suggestive of a hereditary cancer syndrome given her age of diagnosis and the lack of known cancers in the family. We, therefore,  discussed and recommended the following at today's visit.   DISCUSSION:  We discussed that, in general, most cancer is not inherited in families, but instead is sporadic or familial. Sporadic cancers occur by chance and typically happen at older ages (>50 years) as this type of cancer is caused by genetic changes acquired during an individual's lifetime. Some families have more cancers than would be expected by chance; however, the ages or types of cancer are not consistent with a known genetic mutation or known genetic mutations have been ruled out. This type of familial cancer is thought to be due to a combination of multiple genetic, environmental, hormonal, and lifestyle factors. While this combination of factors likely increases the risk of cancer, the exact source of this risk is not currently identifiable or testable.    We discussed that approximately 5-10% of cancer is hereditary, meaning that it is due to a mutation in a single gene that is passed down from generation to generation in a family. Most hereditary cases of hereditary breast cancer are associated with mutations in BRCA1/2. There are other genes that can be associated an increased risk for breast cancer. We discussed that testing can be beneficial for several reasons, including knowing about other cancer risks and to understand if other family members could be at risk for cancer and allow them to undergo genetic testing.  We discussed with [name] that the personal and family history does not meet insurance or NCCN criteria for genetic testing and, therefore, is not highly consistent with a hereditary cancer syndrome.  We discussed that genetic testing is still available if they wished to proceed, especially given some limited health history information about relatives in previous generations and the American Society of Breast Surgeons recommendation for universal genetic testing for those with a diagnosis of breast cancer.  Ms.  Hodge was interested in proceeding with testing as she wished to provide more information to her daughters/grandchildren/great grandchildren about cancer risk.   We reviewed the characteristics, features and inheritance patterns of hereditary cancer syndromes. We also discussed the process of testing, insurance coverage and turn-around-time for results. We discussed the implications of a negative, positive, and variant of uncertain significant result. We recommended [name] pursue genetic testing for a panel that contains genes associated with breast cancer and other common hereditary cancers.  The [facility] CancerNext+RNAinsight Panel includes sequencing, rearrangement analysis, and RNA analysis for the following 34 genes: APC, ATM, BARD1, BMPR1A, BRCA1, BRCA2, BRIP1, CDH1, CDK4, CDKN2A, CHEK2, DICER1, MLH1, MSH2, MSH6, MUTYH, NF1, NTHL1, PALB2, PMS2, PTEN, RAD51C, RAD51D, SMAD4, SMARCA4, STK11 and TP53 (sequencing and deletion/duplication); AXIN2, HOXB13, MSH3, POLD1 and POLE (sequencing only); EPCAM and GREM1 (deletion/duplication only).  PLAN: After considering the risks, benefits, and limitations, [name] provided informed consent to pursue genetic testing and the blood sample was sent to [facility] for analysis of the CancerNext +RNAinsight Panel. Results should be available within approximately 3 weeks' time, at which point they will be disclosed by telephone to  [name], as will any additional recommendations warranted by these results. [name] will receive a summary of her genetic counseling visit and a copy of her results once available. This information will also be available in Epic.   [name] questions were answered to her satisfaction today. Our contact information was provided should additional questions or concerns arise. Thank you for the referral and allowing [place] to share in the care of your patient.   Stephany Poorman M. Rennie Plowman, MS, [facility] Genetic  Counselor [email or web address] (P) [phone]   The patient was seen for less than 10 minutes in face-to-face genetic counseling.

## 2022-11-30 NOTE — Telephone Encounter (Signed)
   Patient Name: [name]  DOB: [birth year] MRN: [id number]  Primary Cardiologist: None  Clinical pharmacists have reviewed the patient's past medical history, labs, and current medications as part of preoperative protocol coverage. The following recommendations have been made:   Patient with diagnosis of afib on Eliquis for anticoagulation.     Procedure: right breast lumpectomy Date of procedure: TBD   CHA2DS2-VASc Score = 6  This indicates a 9.7% annual risk of stroke. The patient's score is based upon: CHF History: 1 HTN History: 1 Diabetes History: 0 Stroke History: 0 Vascular Disease History: 1 Age Score: 2 Gender Score: 1   CrCl 62mL/min Platelet count 153K   Per office protocol, patient can hold Eliquis for 2-3 days prior to procedure.  Please resume Eliquis as soon as possible postprocedure, at the discretion of the surgeon.      I will route this recommendation to the requesting party via Epic fax function and remove from pre-op pool.  Please call with questions.  Joylene Grapes, NP 11/30/2022, 11:11 AM

## 2022-12-01 ENCOUNTER — Telehealth: Payer: Self-pay

## 2022-12-01 NOTE — Telephone Encounter (Signed)
[facility] Clinical Social Work  Clinical Social Work was referred by new patient protocol for assessment of psychosocial needs.  Clinical Social Worker contacted patient by phone to offer support and assess for needs.No answer, left vm with direct contact information. CSW entered in SDOH screenings completed by patient at the [facility].   Marguerita Merles, LCSWA Clinical Social Worker [facility]

## 2022-12-05 ENCOUNTER — Telehealth: Payer: Self-pay

## 2022-12-05 ENCOUNTER — Telehealth: Payer: Self-pay | Admitting: *Deleted

## 2022-12-05 ENCOUNTER — Encounter: Payer: Self-pay | Admitting: *Deleted

## 2022-12-05 NOTE — Telephone Encounter (Signed)
[employer] 2021-05 - Specimen Collection Study to Evaluate Biomarkers in Subjects with Cancer   Called patient to follow up on ES interest; no answer. Per her instructions last week, I called her daughter Reece Levy; no answer, left VM.   Margret Chance Jonna Dittrich, RN, BSN, [facility] She  Her  Hers Clinical Research Nurse [facility] Direct Dial [phone]  Pager [phone] 12/05/2022 3:12 PM

## 2022-12-05 NOTE — Telephone Encounter (Signed)
Left vm regarding bmdc from [phone]. Contact information provided for questions or needs.

## 2022-12-06 ENCOUNTER — Telehealth: Payer: Self-pay | Admitting: Hematology and Oncology

## 2022-12-06 NOTE — Telephone Encounter (Signed)
Left patient a message regarding scheduled appointment times/dates; left callback number if needed for rescheduling

## 2022-12-11 ENCOUNTER — Other Ambulatory Visit: Payer: Self-pay | Admitting: Hematology and Oncology

## 2022-12-11 ENCOUNTER — Telehealth: Payer: Self-pay

## 2022-12-11 NOTE — Telephone Encounter (Signed)
[employer] 2021-05 - Specimen Collection Study to Evaluate Biomarkers in Subjects with Cancer   Patient declined participation in study due to transportation issues. She states she has decided to not have surgery and proceed with PO treatment as discussed at [facility]; sent message to Breast Navigators and Dr Al Pimple.  Margret Chance Emine Lopata, RN, BSN, [facility] She  Her  Hers Clinical Research Nurse [facility] Direct Dial [phone]  Pager [phone] 12/11/2022 3:06 PM

## 2022-12-11 NOTE — Progress Notes (Signed)
I called Ms Vankley and talked to her about letrozole. I called and reviewed adverse effects of anti estrogen therapy including mechanism of action and adverse effects including but not limited to hot flashes, vaginal dryness, arthralgias and bone density loss. She is already on reclast once a yr. she wanted to confirm with her children, wanted to talk to them once before she completely omits the idea of surgery.  I will call her back tomorrow during lunchtime.  If she is willing to start letrozole, I will dispense it to the pharmacy of her choice.

## 2022-12-12 ENCOUNTER — Inpatient Hospital Stay: Payer: Medicare Other | Attending: Hematology and Oncology | Admitting: Hematology and Oncology

## 2022-12-12 ENCOUNTER — Other Ambulatory Visit: Payer: Self-pay | Admitting: Hematology and Oncology

## 2022-12-12 DIAGNOSIS — Z170 Estrogen receptor positive status [ER+]: Secondary | ICD-10-CM

## 2022-12-12 DIAGNOSIS — C50411 Malignant neoplasm of upper-outer quadrant of right female breast: Secondary | ICD-10-CM | POA: Diagnosis not present

## 2022-12-12 MED ORDER — LETROZOLE 2.5 MG PO TABS
2.5000 mg | ORAL_TABLET | Freq: Every day | ORAL | 3 refills | Status: DC
Start: 2022-12-12 — End: 2023-08-20

## 2022-12-12 NOTE — Assessment & Plan Note (Addendum)
This is a very pleasant 89 year old female patient with newly diagnosed right breast invasive ductal carcinoma, grade 2, ER/PR positive HER2 negative referred to breast MDC for additional recommendations.  During her initial visit, we have discussed about SOC being surgery followed by adjuvant antiestrogen therapy.  Patient gave me permission to speak to Dr. Caswell Corwin During Labor Day weekend have gotten a phone call from Dr. Caswell Corwin at [facility] who happens to be a very good family friend of Ms. Curnutt and apparently the patient expressed concern over going off of anticoagulation for surgery and wanted to proceed more conservatively.  Hence we have discussed about considering antiestrogen therapy alone. Today we have had another discussion and patient is in agreement with vomiting surgery.  She apparently had a conversation with all her 3 children and they are all agreeable.  We have reviewed options including tamoxifen versus aromatase inhibitors.  I personally favored aromatase inhibitors since she is already being treated for osteoporosis.  We have reviewed about anastrozole versus letrozole versus exemestane.  We have reviewed most common adverse effects including but not limited to hot flashes, vaginal dryness, arthralgias and bone density loss in the long-term.  After discussion, she was unsure about which pharmacy I was to send the letrozole to.  She told me that she will call us back with the name of the pharmacy and until then we can hold off on sending the prescription.  At this point we will wait for her phone call back.  I will plan to see her back in approximately 3 months and mammogram in 6 months.

## 2022-12-12 NOTE — Progress Notes (Signed)
[facility] CONSULT NOTE  Patient Care Team: Lynnea Ferrier, MD as PCP - General Lanier Prude, MD as PCP - Electrophysiology (Cardiology) Donnelly Angelica, RN as Oncology Nurse Navigator Pershing Proud, RN as Oncology Nurse Navigator Almond Lint, MD as Consulting Physician (General Surgery) Rachel Moulds, MD as Consulting Physician (Hematology and Oncology) Dorothy Puffer, MD as Consulting Physician (Radiation Oncology)  CHIEF COMPLAINTS/PURPOSE OF CONSULTATION:  Newly diagnosed breast cancer  HISTORY OF PRESENTING ILLNESS:  [name] 89 y.o. female is here because of recent diagnosis of right breast cancer  I reviewed her records extensively and collaborated the history with the patient.  SUMMARY OF ONCOLOGIC HISTORY: Oncology History  Malignant neoplasm of upper-outer quadrant of right breast in female, estrogen receptor positive (HCC)  11/01/2022 Mammogram   Screening mammogram with no indeterminate asymmetry right breast. Additional views with possible ultrasound are recommended.  Ultrasound showed 6 x 8 mm irregular mass in the right breast which was thought to be indeterminate, biopsy recommended   11/15/2022 Pathology Results   Right breast needle core biopsy at 12:00 showed grade 2 invasive mammary carcinoma, prognostic showed ER 100% positive strong staining, PR 60% positive moderate to strong staining intensity group 5 HER2 negative and Ki-67 of 15%   11/27/2022 Initial Diagnosis   Malignant neoplasm of upper-outer quadrant of right breast in female, estrogen receptor positive (HCC)   11/29/2022 Cancer Staging   Staging form: Breast, AJCC 8th Edition - Clinical stage from 11/29/2022: Stage IA (cT1b, cN0, cM0, G2, ER+, PR+, HER2-) - Signed by Ronny Bacon, PA-C on 11/29/2022 Stage prefix: Initial diagnosis Method of lymph node assessment: Clinical Histologic grading system: 3 grade system    She is here for a telephone visit. Since  our last visit I have heard from her good family friend Dr Caswell Corwin from [facility] as well as some messages from the patient saying that she doesn't want to proceed with surgery especially more worried about coming off of blood thinner.  Hence we agreed to proceed with anti estrogen therapy alone.  Rest of the pertinent 10 point reviewed and negative  MEDICAL HISTORY:  Past Medical History:  Diagnosis Date   Arthritis    Atrial fibrillation (HCC)    Breast cancer (HCC)    Cancer (HCC)    Basal cell many times on face   CHF (congestive heart failure) (HCC)    Coronary artery disease    Dyspnea    sob when walking stairs   Dysrhythmia    AF/SSS/NSR   Fibroid    Hypertension    Menopausal symptoms    Osteoporosis 2018   T score -2.8 stable/improved from prior DEXA   Polymyalgia (HCC)    Presence of permanent cardiac pacemaker     SURGICAL HISTORY: Past Surgical History:  Procedure Laterality Date   BASAL CELL CARCINOMA EXCISION     BREAST SURGERY Left 1958   Breast Bx   CARDIAC CATHETERIZATION  2005   CARDIAC ELECTROPHYSIOLOGY STUDY AND ABLATION     FLEXIBLE SIGMOIDOSCOPY N/A 10/27/2016   Procedure: FLEXIBLE SIGMOIDOSCOPY;  Surgeon: Wyline Mood, MD;  Location: [facility] ENDOSCOPY;  Service: Gastroenterology;  Laterality: N/A;   HYSTEROSCOPY     X 2   IMPLANTABLE CARDIOVERTER DEFIBRILLATOR (ICD) GENERATOR CHANGE N/A 05/01/2018   Procedure: PACEMAKER BATTERY CHANGE CURRENT=ST. JUDE, NEW=MEDTRONIC;  Surgeon: Marcina Millard, MD;  Location: [facility] ORS;  Service: Cardiovascular;  Laterality: N/A;   INSERT / REPLACE / REMOVE PACEMAKER  SKIN BIOPSY     basal cell removed many times    SOCIAL HISTORY: Social History   Socioeconomic History   Marital status: Widowed    Spouse name: Not on file   Number of children: Not on file   Years of education: Not on file   Highest education level: Not on file  Occupational History   Occupation: RN    Comment: retired  Tobacco Use    Smoking status: Never   Smokeless tobacco: Never  Vaping Use   Vaping status: Never Used  Substance and Sexual Activity   Alcohol use: Yes    Alcohol/week: 4.0 standard drinks of alcohol    Types: 4 Standard drinks or equivalent per week   Drug use: No   Sexual activity: Not Currently    Birth control/protection: Post-menopausal    Comment: 1st intercourse 15 yo-1 partner  Other Topics Concern   Not on file  Social History Narrative   Not on file   Social Determinants of Health   Financial Resource Strain: Low Risk  (10/20/2022)   Received from [facility]   Overall Financial Resource Strain (CARDIA)    Difficulty of Paying Living Expenses: Not hard at all  Food Insecurity: No Food Insecurity (12/01/2022)   Hunger Vital Sign    Worried About Running Out of Food in the Last Year: Never true    Ran Out of Food in the Last Year: Never true  Transportation Needs: No Transportation Needs (12/01/2022)   PRAPARE - Administrator, Civil Service (Medical): No    Lack of Transportation (Non-Medical): No  Physical Activity: Not on file  Stress: Not on file  Social Connections: Not on file  Intimate Partner Violence: Not At Risk (12/01/2022)   Humiliation, Afraid, Rape, and Kick questionnaire    Fear of Current or Ex-Partner: No    Emotionally Abused: No    Physically Abused: No    Sexually Abused: No    FAMILY HISTORY: Family History  Problem Relation Age of Onset   Hypertension Mother    Heart disease Mother    Heart disease Father    Osteoporosis Father     ALLERGIES:  is allergic to moxifloxacin and tobramycin.  MEDICATIONS:  Current Outpatient Medications  Medication Sig Dispense Refill   acetaminophen (TYLENOL) 500 MG tablet Take 500 mg by mouth every 6 (six) hours as needed for moderate pain.     amLODipine (NORVASC) 5 MG tablet Take 1 tablet (5 mg total) by mouth daily. 90 tablet 1   apixaban (ELIQUIS) 2.5 MG TABS tablet Take 1 tablet  (2.5 mg total) by mouth 2 (two) times daily. 180 tablet 1   Biotin 1 MG CAPS Take 1 capsule by mouth daily. (Patient not taking: Reported on 10/11/2022)     chlorthalidone (HYGROTON) 25 MG tablet Take 25 mg by mouth daily.     cyanocobalamin (VITAMIN B12) 1000 MCG/ML injection Inject 1,000 mcg into the muscle every 30 (thirty) days.     Melatonin 10 MG TABS Take 10 mg by mouth as needed.     spironolactone (ALDACTONE) 25 MG tablet Take 25 mg by mouth daily.     No current facility-administered medications for this visit.    PHYSICAL EXAMINATION: ECOG PERFORMANCE STATUS: 0 - Asymptomatic  There were no vitals filed for this visit.  There were no vitals filed for this visit.  PE deferred, telephone visit.  LABORATORY DATA:  I have reviewed the data as listed  Lab Results  Component Value Date   WBC 6.0 11/29/2022   HGB 13.9 11/29/2022   HCT 42.3 11/29/2022   MCV 98.1 11/29/2022   PLT 153 11/29/2022   Lab Results  Component Value Date   NA 142 11/29/2022   K 4.7 11/29/2022   CL 104 11/29/2022   CO2 30 11/29/2022    RADIOGRAPHIC STUDIES: I have personally reviewed the radiological reports and agreed with the findings in the report.  ASSESSMENT AND PLAN:   Malignant neoplasm of upper-outer quadrant of right breast in female, estrogen receptor positive (HCC) This is a very pleasant 89 year old female patient with newly diagnosed right breast invasive ductal carcinoma, grade 2, ER/PR positive HER2 negative referred to breast MDC for additional recommendations.  During her initial visit, we have discussed about SOC being surgery followed by adjuvant antiestrogen therapy.  Patient gave me permission to speak to Dr. Caswell Corwin During Labor Day weekend have gotten a phone call from Dr. Caswell Corwin at [facility] who happens to be a very good family friend of Ms. Harpold and apparently the patient expressed concern over going off of anticoagulation for surgery and wanted to proceed more conservatively.   Hence we have discussed about considering antiestrogen therapy alone. Today we have had another discussion and patient is in agreement with vomiting surgery.  She apparently had a conversation with all her 3 children and they are all agreeable.  We have reviewed options including tamoxifen versus aromatase inhibitors.  I personally favored aromatase inhibitors since she is already being treated for osteoporosis.  We have reviewed about anastrozole versus letrozole versus exemestane.  We have reviewed most common adverse effects including but not limited to hot flashes, vaginal dryness, arthralgias and bone density loss in the long-term.  After discussion, she was unsure about which pharmacy I was to send the letrozole to.  She told me that she will call us back with the name of the pharmacy and until then we can hold off on sending the prescription.  At this point we will wait for her phone call back.  I will plan to see her back in approximately 3 months and mammogram in 6 months.  I connected with  [name] on 12/12/22 by a telephone application and verified that I am speaking with the correct person using two identifiers.   I discussed the limitations of evaluation and management by telemedicine. The patient expressed understanding and agreed to proceed.  Total time: 20 min  All questions were answered. The patient knows to call the clinic with any problems, questions or concerns.    Rachel Moulds, MD 12/12/22

## 2022-12-13 ENCOUNTER — Telehealth: Payer: Self-pay | Admitting: Genetic Counselor

## 2022-12-13 ENCOUNTER — Encounter: Payer: Self-pay | Admitting: Genetic Counselor

## 2022-12-13 ENCOUNTER — Encounter: Payer: Self-pay | Admitting: *Deleted

## 2022-12-13 DIAGNOSIS — Z1379 Encounter for other screening for genetic and chromosomal anomalies: Secondary | ICD-10-CM | POA: Insufficient documentation

## 2022-12-13 NOTE — Telephone Encounter (Signed)
Contacted patient in attempt to disclose results of genetic testing.  LVM with contact information requesting a call back.  

## 2022-12-14 ENCOUNTER — Ambulatory Visit: Payer: Medicare Other | Attending: Cardiology | Admitting: Cardiology

## 2022-12-15 ENCOUNTER — Encounter: Payer: Self-pay | Admitting: Cardiology

## 2022-12-19 ENCOUNTER — Ambulatory Visit: Payer: Self-pay | Admitting: Genetic Counselor

## 2022-12-19 ENCOUNTER — Encounter: Payer: Self-pay | Admitting: *Deleted

## 2022-12-19 DIAGNOSIS — Z170 Estrogen receptor positive status [ER+]: Secondary | ICD-10-CM

## 2022-12-19 DIAGNOSIS — Z1379 Encounter for other screening for genetic and chromosomal anomalies: Secondary | ICD-10-CM

## 2022-12-19 NOTE — Telephone Encounter (Signed)
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

## 2022-12-19 NOTE — Progress Notes (Addendum)
 HPI:   [name] was previously seen in the [facility] due to a personal history of breast cancer and concerns regarding a hereditary predisposition to cancer.    [name] recent genetic test results were disclosed to her by telephone. These results and recommendations are discussed in more detail below.  CANCER HISTORY:  Oncology History  Malignant neoplasm of upper-outer quadrant of right breast in female, estrogen receptor positive (HCC)  11/01/2022 Mammogram   Screening mammogram with no indeterminate asymmetry right breast. Additional views with possible ultrasound are recommended.  Ultrasound showed 6 x 8 mm irregular mass in the right breast which was thought to be indeterminate, biopsy recommended   11/15/2022 Pathology Results   Right breast needle core biopsy at 12:00 showed grade 2 invasive mammary carcinoma, prognostic showed ER 100% positive strong staining, PR 60% positive moderate to strong staining intensity group 5 HER2 negative and Ki-67 of 15%   11/27/2022 Initial Diagnosis   Malignant neoplasm of upper-outer quadrant of right breast in female, estrogen receptor positive (HCC)   11/29/2022 Cancer Staging   Staging form: Breast, AJCC 8th Edition - Clinical stage from 11/29/2022: Stage IA (cT1b, cN0, cM0, G2, ER+, PR+, HER2-) - Signed by Bettejane Brownie, PA-C on 11/29/2022 Stage prefix: Initial diagnosis Method of lymph node assessment: Clinical Histologic grading system: 3 grade system   12/12/2022 Genetic Testing   Negative [employer] CancerNext +RNAinsight Panel.  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 Report date is 12/12/2022.   The [employer] CancerNext+RNAinsight Panel includes sequencing, rearrangement analysis, and RNA analysis for the following 34 genes: APC, ATM, BARD1, BMPR1A, BRCA1, BRCA2, BRIP1, CDH1, CDK4, CDKN2A, CHEK2, DICER1, MLH1, MSH2, MSH6, MUTYH, NF1, NTHL1, PALB2, PMS2, PTEN, RAD51C, RAD51D, SMAD4, SMARCA4, STK11 and TP53 (sequencing  and deletion/duplication); AXIN2, HOXB13, MSH3, POLD1 and POLE (sequencing only); EPCAM and GREM1 (deletion/duplication only).     FAMILY HISTORY:  We obtained a 4-generation family history.  She did not report a known family history of cancer.         [name] is unaware of previous family history of genetic testing for hereditary cancer risks. There is no reported Ashkenazi Jewish ancestry. There is no known consanguinity.  GENETIC TEST RESULTS:  The [employer] CancerNext +RNAinsight Panel found no pathogenic mutations.   The [employer] CancerNext+RNAinsight Panel includes sequencing, rearrangement analysis, and RNA analysis for the following 34 genes: APC, ATM, BARD1, BMPR1A, BRCA1, BRCA2, BRIP1, CDH1, CDK4, CDKN2A, CHEK2, DICER1, MLH1, MSH2, MSH6, MUTYH, NF1, NTHL1, PALB2, PMS2, PTEN, RAD51C, RAD51D, SMAD4, SMARCA4, STK11 and TP53 (sequencing and deletion/duplication); AXIN2, HOXB13, MSH3, POLD1 and POLE (sequencing only); EPCAM and GREM1 (deletion/duplication only).  The test report has been scanned into EPIC and is located under the Molecular Pathology section of the Results Review tab.  A portion of the result report is included below for reference. Genetic testing reported out on December 12, 2022.      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  At this time, it is unknown if this variant is associated with an increased risk for cancer or if it is benign, but most uncertain variants are reclassified to benign. It should not be used to make medical management decisions. With time, we suspect the laboratory will determine the significance of this variant, if any. If the laboratory reclassifies this variant, we will attempt to contact [name] to discuss it further.   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  Amended report date is 07/27/2023.     Even though a pathogenic variant was not  identified, possible explanations for the cancer in the family may include: There may be no hereditary risk for cancer in the family. The cancers in [name] and/or her family may be sporadic/familial or due to other genetic and environmental factors.  This is most likely given her age at diagnosis and lack of family history of cancer.  There may be a gene mutation in one of these genes that current testing methods cannot detect but that chance is small. There could be another gene that has not yet been discovered, or that we have not yet tested, that is responsible for the cancer diagnoses in the family.  The variant of uncertain significance detected in the PALB2 gene may be reclassified as a pathogenic variant in the future. At this time, we do not know if this variant increases the risk for cancer.  Therefore, it is important to remain in touch with cancer genetics in the future so that we can continue to offer [name] the most up to date genetic testing.    ADDITIONAL GENETIC TESTING:   [name] genetic testing was fairly extensive.  If there are additional relevant genes identified to increase cancer risk that can be analyzed in the future, we would be happy to discuss and coordinate this testing at that time.     CANCER SCREENING RECOMMENDATIONS:  [name] test result is considered negative (normal).  This means that we have not identified a hereditary cause for her personal history of breast cancer at this time.    An individual's cancer risk and medical management are not determined by genetic test results alone. Overall cancer risk assessment incorporates additional factors, including personal medical history, family history, and any available genetic information that may result in a personalized plan for cancer prevention and surveillance. Therefore, it is recommended she continue to follow the cancer management and screening guidelines provided by her oncology and  primary healthcare provider.   RECOMMENDATIONS FOR FAMILY MEMBERS:   Since she did not inherit a identifiable mutation in a cancer predisposition gene included on this panel, her children could not have inherited a known mutation from her in one of these genes. Individuals in this family might be at some increased risk of developing cancer, over the general population risk, due to the family history of cancer.  Individuals in the family should notify their providers of the family history of cancer. We recommend women in this family have a yearly mammogram beginning at age 29, or 46 years younger than the earliest onset of cancer, an annual clinical breast exam, and perform monthly breast self-exams.  Risk models that take into account family history and hormonal history may be helpful in determining appropriate breast cancer screening options for family members.  We do not recommend familial testing for the PALB2 variant of uncertain significance (VUS).  FOLLOW-UP:  Cancer genetics is a rapidly advancing field and it is possible that new genetic tests will be appropriate for her and/or her family members in the future. We encourage [name] to remain in contact with cancer genetics, so we can update her personal and family histories and let her know of advances in cancer genetics that may benefit this family.   Our contact number was provided.  They are welcome to call us  at anytime with additional questions or concerns.   Sadie Pickar M. Elexis Pollak,  MS, [facility] Genetic Counselor [email or web address] (P) [phone]

## 2022-12-20 ENCOUNTER — Telehealth: Payer: Self-pay

## 2022-12-20 NOTE — Telephone Encounter (Signed)
Pt called and LVM regarding starting letrozole therapy and wondering if she could still continue her Tepezza infusions. This RN returned the phone call and patient stated that she is feeling a "little dizzy" and a "little nauseous" but feels the same as she did "last time I was on this therapy." Pt stated that she visited her ophthalmologist yesterday and that he had suggested that she hold off on the Tepezza infusions for a little while. This RN verbalized that he is most likely more aware of the effects of this infusion than we are due to him being more familiar with those kinds of treatments and to follow what he recommends.  This RN also stated that there are no clear contraindications with the Tepezza infusions and her current Letrozole therapy and to just get cleared by her ophthalmologist to receive in the future. This RN verbalized to please give [place] a call back if any new symptoms or concerns arise. Patient verbalized understanding.

## 2022-12-21 ENCOUNTER — Ambulatory Visit (HOSPITAL_COMMUNITY): Admit: 2022-12-21 | Payer: Medicare Other | Admitting: General Surgery

## 2022-12-21 SURGERY — BREAST LUMPECTOMY WITH RADIOACTIVE SEED LOCALIZATION
Anesthesia: General | Laterality: Right

## 2023-01-08 ENCOUNTER — Ambulatory Visit: Payer: Medicare Other | Admitting: Hematology and Oncology

## 2023-02-08 ENCOUNTER — Ambulatory Visit: Payer: Medicare Other | Attending: Cardiology | Admitting: Cardiology

## 2023-02-08 ENCOUNTER — Encounter: Payer: Self-pay | Admitting: Cardiology

## 2023-02-08 VITALS — BP 132/80 | HR 59 | Ht 62.0 in | Wt 115.0 lb

## 2023-02-08 DIAGNOSIS — I4821 Permanent atrial fibrillation: Secondary | ICD-10-CM | POA: Diagnosis present

## 2023-02-08 DIAGNOSIS — I517 Cardiomegaly: Secondary | ICD-10-CM | POA: Diagnosis present

## 2023-02-08 DIAGNOSIS — Z950 Presence of cardiac pacemaker: Secondary | ICD-10-CM | POA: Diagnosis present

## 2023-02-08 DIAGNOSIS — I10 Essential (primary) hypertension: Secondary | ICD-10-CM | POA: Diagnosis present

## 2023-02-08 MED ORDER — LOSARTAN POTASSIUM 25 MG PO TABS: 25.0000 mg | ORAL_TABLET | Freq: Every day | ORAL | 3 refills | Status: DC

## 2023-02-08 NOTE — Progress Notes (Signed)
Cardiology Office Note:    Date:  02/08/2023   ID:  [name], DOB [birth year], MRN [id number]  PCP:  Lynnea Ferrier, MD   [facility] Providers Cardiologist:  Debbe Odea, MD Electrophysiologist:  Lanier Prude, MD     Referring MD: Lynnea Ferrier, MD   Chief Complaint  Patient presents with   Follow-up    Discuss cardiac testing results.  Patient would like to discuss need to take Chlorthalidone every day due to increased urination.      History of Present Illness:    [name] is a 89 y.o. female with a hx of permanent A-fib, sick sinus syndrome, s/p pacemaker, hypertension who presents for follow-up.  Patient was last seen due to persistent A-fib and fatigue.  Norvasc previously reduced to 5 mg daily due to symptoms of fatigue and edema.  Compliant with Aldactone, Eliquis 2.5 mg twice daily as prescribed, no bleeding issues.  Echocardiogram obtained to evaluate significant structural abnormalities.  Has noticed increased urination with taking chlorthalidone 25 mg daily.  Takes chlorthalidone in the morning, states being up all night urinating.  Otherwise doing okay.  Feels like pacemaker is protruding.   Past Medical History:  Diagnosis Date   Arthritis    Atrial fibrillation (HCC)    Breast cancer (HCC)    Cancer (HCC)    Basal cell many times on face   CHF (congestive heart failure) (HCC)    Coronary artery disease    Dyspnea    sob when walking stairs   Dysrhythmia    AF/SSS/NSR   Fibroid    Hypertension    Menopausal symptoms    Osteoporosis 2018   T score -2.8 stable/improved from prior DEXA   Polymyalgia (HCC)    Presence of permanent cardiac pacemaker     Past Surgical History:  Procedure Laterality Date   BASAL CELL CARCINOMA EXCISION     BREAST SURGERY Left 1958   Breast Bx   CARDIAC CATHETERIZATION  2005   CARDIAC ELECTROPHYSIOLOGY STUDY AND ABLATION     FLEXIBLE SIGMOIDOSCOPY N/A 10/27/2016   Procedure:  FLEXIBLE SIGMOIDOSCOPY;  Surgeon: Wyline Mood, MD;  Location: [facility] ENDOSCOPY;  Service: Gastroenterology;  Laterality: N/A;   HYSTEROSCOPY     X 2   IMPLANTABLE CARDIOVERTER DEFIBRILLATOR (ICD) GENERATOR CHANGE N/A 05/01/2018   Procedure: PACEMAKER BATTERY CHANGE CURRENT=ST. JUDE, NEW=MEDTRONIC;  Surgeon: Marcina Millard, MD;  Location: [facility] ORS;  Service: Cardiovascular;  Laterality: N/A;   INSERT / REPLACE / REMOVE PACEMAKER     SKIN BIOPSY     basal cell removed many times    Current Medications: Current Meds  Medication Sig   acetaminophen (TYLENOL) 500 MG tablet Take 500 mg by mouth every 6 (six) hours as needed for moderate pain.   amLODipine (NORVASC) 5 MG tablet Take 1 tablet (5 mg total) by mouth daily.   apixaban (ELIQUIS) 2.5 MG TABS tablet Take 1 tablet (2.5 mg total) by mouth 2 (two) times daily.   cyanocobalamin (VITAMIN B12) 1000 MCG/ML injection Inject 1,000 mcg into the muscle every 30 (thirty) days.   letrozole (FEMARA) 2.5 MG tablet Take 1 tablet (2.5 mg total) by mouth daily.   losartan (COZAAR) 25 MG tablet Take 1 tablet (25 mg total) by mouth daily.   Melatonin 10 MG TABS Take 10 mg by mouth as needed.   methimazole (TAPAZOLE) 5 MG tablet Take 5 mg by mouth daily.   [DISCONTINUED] chlorthalidone (HYGROTON) 25 MG  tablet Take 25 mg by mouth daily.     Allergies:   Moxifloxacin and Tobramycin   Social History   Socioeconomic History   Marital status: Widowed    Spouse name: Not on file   Number of children: Not on file   Years of education: Not on file   Highest education level: Not on file  Occupational History   Occupation: RN    Comment: retired  Tobacco Use   Smoking status: Never   Smokeless tobacco: Never  Vaping Use   Vaping status: Never Used  Substance and Sexual Activity   Alcohol use: Yes    Alcohol/week: 4.0 standard drinks of alcohol    Types: 4 Standard drinks or equivalent per week   Drug use: No   Sexual activity: Not Currently     Birth control/protection: Post-menopausal    Comment: 1st intercourse 29 yo-1 partner  Other Topics Concern   Not on file  Social History Narrative   Not on file   Social Determinants of Health   Financial Resource Strain: Low Risk  (10/20/2022)   Received from [facility]   Overall Financial Resource Strain (CARDIA)    Difficulty of Paying Living Expenses: Not hard at all  Food Insecurity: No Food Insecurity (12/01/2022)   Hunger Vital Sign    Worried About Running Out of Food in the Last Year: Never true    Ran Out of Food in the Last Year: Never true  Transportation Needs: No Transportation Needs (12/01/2022)   PRAPARE - Administrator, Civil Service (Medical): No    Lack of Transportation (Non-Medical): No  Physical Activity: Not on file  Stress: Not on file  Social Connections: Not on file     Family History: The patient's family history includes Heart disease in her father and mother; Hypertension in her mother; Osteoporosis in her father.  ROS:   Please see the history of present illness.     All other systems reviewed and are negative.  EKGs/Labs/Other Studies Reviewed:    The following studies were reviewed today:       Recent Labs: 08/04/2022: B Natriuretic Peptide 183.8 08/10/2022: TSH 1.605 11/29/2022: ALT 8; BUN 23; Creatinine 0.83; Hemoglobin 13.9; Platelet Count 153; Potassium 4.7; Sodium 142  Recent Lipid Panel No results found for: "CHOL", "TRIG", "HDL", "CHOLHDL", "VLDL", "LDLCALC", "LDLDIRECT"   Risk Assessment/Calculations:             Physical Exam:    VS:  BP 132/80 (BP Location: Left Arm, Patient Position: Sitting, Cuff Size: Normal)   Pulse (!) 59   Ht 5\' 2"  (1.575 m)   Wt 115 lb (52.2 kg)   SpO2 91%   BMI 21.03 kg/m     Wt Readings from Last 3 Encounters:  02/08/23 115 lb (52.2 kg)  11/29/22 117 lb 3.2 oz (53.2 kg)  10/11/22 118 lb 6.4 oz (53.7 kg)     GEN:  Well nourished, well developed in no acute  distress HEENT: Normal NECK: No JVD; No carotid bruits CARDIAC: RRR, no murmurs, rubs, gallops RESPIRATORY:  Clear to auscultation without rales, wheezing or rhonchi  ABDOMEN: Soft, non-tender, non-distended MUSCULOSKELETAL:  No edema; No deformity  SKIN: Warm and dry NEUROLOGIC:  Alert and oriented x 3 PSYCHIATRIC:  Normal affect   ASSESSMENT:    1. Permanent atrial fibrillation (HCC)   2. Primary hypertension   3. Cardiac pacemaker in situ   4. LVH (left ventricular hypertrophy)  PLAN:    In order of problems listed above:  Permanent atrial fibrillation, sick sinus syndrome.  Continue Eliquis 2.5 mg twice daily.  Echo 11/2022 EF 55 to 60%, severely dilated left atrium. Hypertension, BP controlled, continue Norvasc 5 mg daily, start losartan, stop chlorthalidone due to polyuria.  Check BMP in 10 days.  Given her age, systolic in 140s okay.  Had symptoms of edema with higher dose of Norvasc. Sick sinus syndrome s/p pacemaker.  Continue monitoring as per device clinic. Severe basal septal LVH, no LVOT gradient.  Due to age, lack of symptoms, will not pursue additional testing.  Follow-up 3 months.      Medication Adjustments/Labs and Tests Ordered: Current medicines are reviewed at length with the patient today.  Concerns regarding medicines are outlined above.  Orders Placed This Encounter  Procedures   Basic Metabolic Panel (BMET)   Meds ordered this encounter  Medications   losartan (COZAAR) 25 MG tablet    Sig: Take 1 tablet (25 mg total) by mouth daily.    Dispense:  90 tablet    Refill:  3    Patient Instructions  Medication Instructions:   STOP chlorthalidone (HYGROTON) 25 MG tablet  START Losartan - Take one tablet ( 25mg ) by mouth daily.   *If you need a refill on your cardiac medications before your next appointment, please call your pharmacy*   Lab Work:  Your provider would like for you to return in 10 days -- 02/18/2023  to have the following labs  drawn: BMP.   Please go to [facility] [address] ([facility], [place] [address] You do not need an appointment.  They are open from 7:30 am-4 pm.  Lunch from 1:00 pm- 2:00 pm You WILL NOT need to be fasting.  If you have labs (blood work) drawn today and your tests are completely normal, you will receive your results only by: MyChart Message (if you have MyChart) OR A paper copy in the mail If you have any lab test that is abnormal or we need to change your treatment, we will call you to review the results.   Testing/Procedures:  None Ordered   Follow-Up: At [facility], you and your health needs are our priority.  As part of our continuing mission to provide you with exceptional heart care, we have created designated Provider Care Teams.  These Care Teams include your primary Cardiologist (physician) and Advanced Practice Providers (APPs -  Physician Assistants and Nurse Practitioners) who all work together to provide you with the care you need, when you need it.  We recommend signing up for the patient portal called "MyChart".  Sign up information is provided on this After Visit Summary.  MyChart is used to connect with patients for Virtual Visits (Telemedicine).  Patients are able to view lab/test results, encounter notes, upcoming appointments, etc.  Non-urgent messages can be sent to your provider as well.   To learn more about what you can do with MyChart, go to [email or web address].    Your next appointment:   3 month(s)  Provider:   You may see Debbe Odea, MD or one of the following Advanced Practice Providers on your designated Care Team:   Nicolasa Ducking, NP Eula Listen, PA-C Cadence Fransico Michael, PA-C Charlsie Quest, NP Carlos Levering, NP    Signed, Debbe Odea, MD  02/08/2023 11:42 AM    [facility]

## 2023-02-08 NOTE — Patient Instructions (Addendum)
Medication Instructions:   STOP chlorthalidone (HYGROTON) 25 MG tablet  START Losartan - Take one tablet ( 25mg ) by mouth daily.   *If you need a refill on your cardiac medications before your next appointment, please call your pharmacy*   Lab Work:  Your provider would like for you to return in 10 days -- 02/18/2023  to have the following labs drawn: BMP.   Please go to [facility] [address] ([facility], [place] [address] You do not need an appointment.  They are open from 7:30 am-4 pm.  Lunch from 1:00 pm- 2:00 pm You WILL NOT need to be fasting.  If you have labs (blood work) drawn today and your tests are completely normal, you will receive your results only by: MyChart Message (if you have MyChart) OR A paper copy in the mail If you have any lab test that is abnormal or we need to change your treatment, we will call you to review the results.   Testing/Procedures:  None Ordered   Follow-Up: At [facility], you and your health needs are our priority.  As part of our continuing mission to provide you with exceptional heart care, we have created designated Provider Care Teams.  These Care Teams include your primary Cardiologist (physician) and Advanced Practice Providers (APPs -  Physician Assistants and Nurse Practitioners) who all work together to provide you with the care you need, when you need it.  We recommend signing up for the patient portal called "MyChart".  Sign up information is provided on this After Visit Summary.  MyChart is used to connect with patients for Virtual Visits (Telemedicine).  Patients are able to view lab/test results, encounter notes, upcoming appointments, etc.  Non-urgent messages can be sent to your provider as well.   To learn more about what you can do with MyChart, go to [email or web address].    Your next appointment:   3 month(s)  Provider:   You may see Debbe Odea, MD or one of the  following Advanced Practice Providers on your designated Care Team:   Nicolasa Ducking, NP Eula Listen, PA-C Cadence Fransico Michael, PA-C Charlsie Quest, NP Carlos Levering, NP

## 2023-02-23 ENCOUNTER — Ambulatory Visit (INDEPENDENT_AMBULATORY_CARE_PROVIDER_SITE_OTHER): Payer: Medicare Other

## 2023-02-23 DIAGNOSIS — I495 Sick sinus syndrome: Secondary | ICD-10-CM

## 2023-02-23 LAB — CUP PACEART REMOTE DEVICE CHECK
Battery Remaining Longevity: 80 mo
Battery Voltage: 2.97 V
Brady Statistic AP VP Percent: 46.15 %
Brady Statistic AP VS Percent: 22.89 %
Brady Statistic AS VP Percent: 15.02 %
Brady Statistic AS VS Percent: 15.93 %
Brady Statistic RA Percent Paced: 69.6 %
Brady Statistic RV Percent Paced: 61.14 %
Date Time Interrogation Session: 20241121225945
Implantable Lead Connection Status: 753985
Implantable Lead Connection Status: 753985
Implantable Lead Implant Date: 20100625
Implantable Lead Implant Date: 20100625
Implantable Lead Location: 753859
Implantable Lead Location: 753862
Implantable Pulse Generator Implant Date: 20200129
Lead Channel Impedance Value: 266 Ohm
Lead Channel Impedance Value: 380 Ohm
Lead Channel Impedance Value: 399 Ohm
Lead Channel Impedance Value: 532 Ohm
Lead Channel Pacing Threshold Amplitude: 0.875 V
Lead Channel Pacing Threshold Amplitude: 2.25 V
Lead Channel Pacing Threshold Pulse Width: 0.4 ms
Lead Channel Pacing Threshold Pulse Width: 0.4 ms
Lead Channel Sensing Intrinsic Amplitude: 0.375 mV
Lead Channel Sensing Intrinsic Amplitude: 0.375 mV
Lead Channel Sensing Intrinsic Amplitude: 11.5 mV
Lead Channel Sensing Intrinsic Amplitude: 11.5 mV
Lead Channel Setting Pacing Amplitude: 1.75 V
Lead Channel Setting Pacing Amplitude: 3.5 V
Lead Channel Setting Pacing Pulse Width: 0.4 ms
Lead Channel Setting Sensing Sensitivity: 0.9 mV
Zone Setting Status: 755011
Zone Setting Status: 755011

## 2023-02-28 ENCOUNTER — Encounter: Payer: Self-pay | Admitting: Cardiology

## 2023-03-05 ENCOUNTER — Telehealth: Payer: Self-pay | Admitting: Cardiology

## 2023-03-05 ENCOUNTER — Inpatient Hospital Stay: Payer: Medicare Other | Attending: Hematology and Oncology | Admitting: Hematology and Oncology

## 2023-03-05 ENCOUNTER — Other Ambulatory Visit: Payer: Self-pay

## 2023-03-05 VITALS — BP 176/74 | HR 72 | Temp 97.3°F | Resp 18 | Wt 116.8 lb

## 2023-03-05 DIAGNOSIS — C50411 Malignant neoplasm of upper-outer quadrant of right female breast: Secondary | ICD-10-CM | POA: Diagnosis not present

## 2023-03-05 DIAGNOSIS — Z170 Estrogen receptor positive status [ER+]: Secondary | ICD-10-CM

## 2023-03-05 NOTE — Telephone Encounter (Signed)
Call placed back to the patient. Her blood pressure was now 178/89. She is heading out to an oncology appointment and they will check her blood pressure there as well.

## 2023-03-05 NOTE — Progress Notes (Signed)
Faxed order for mammogram to [facility] at [phone], received a fax confirmation.

## 2023-03-05 NOTE — Telephone Encounter (Signed)
Pt c/o BP issue: STAT if pt c/o blurred vision, one-sided weakness or slurred speech  1. What are your last 5 BP readings?  179/104 189/113  47-67 HR range  2. Are you having any other symptoms (ex. Dizziness, headache, blurred vision, passed out)?  Dizziness, weakness   3. What is your BP issue?   BP is elevated causing issues with balance.

## 2023-03-05 NOTE — Progress Notes (Signed)
[facility] CONSULT NOTE  Patient Care Team: Lynnea Ferrier, MD as PCP - General Lanier Prude, MD as PCP - Electrophysiology (Cardiology) Debbe Odea, MD as PCP - Cardiology (Cardiology) Donnelly Angelica, RN as Oncology Nurse Navigator Pershing Proud, RN as Oncology Nurse Navigator Almond Lint, MD as Consulting Physician (General Surgery) Rachel Moulds, MD as Consulting Physician (Hematology and Oncology) Dorothy Puffer, MD as Consulting Physician (Radiation Oncology)  CHIEF COMPLAINTS/PURPOSE OF CONSULTATION:  Newly diagnosed breast cancer  HISTORY OF PRESENTING ILLNESS:  [name] 89 y.o. female is here because of recent diagnosis of right breast cancer  I reviewed her records extensively and collaborated the history with the patient.  SUMMARY OF ONCOLOGIC HISTORY: Oncology History  Malignant neoplasm of upper-outer quadrant of right breast in female, estrogen receptor positive (HCC)  11/01/2022 Mammogram   Screening mammogram with no indeterminate asymmetry right breast. Additional views with possible ultrasound are recommended.  Ultrasound showed 6 x 8 mm irregular mass in the right breast which was thought to be indeterminate, biopsy recommended   11/15/2022 Pathology Results   Right breast needle core biopsy at 12:00 showed grade 2 invasive mammary carcinoma, prognostic showed ER 100% positive strong staining, PR 60% positive moderate to strong staining intensity group 5 HER2 negative and Ki-67 of 15%   11/27/2022 Initial Diagnosis   Malignant neoplasm of upper-outer quadrant of right breast in female, estrogen receptor positive (HCC)   11/29/2022 Cancer Staging   Staging form: Breast, AJCC 8th Edition - Clinical stage from 11/29/2022: Stage IA (cT1b, cN0, cM0, G2, ER+, PR+, HER2-) - Signed by Ronny Bacon, PA-C on 11/29/2022 Stage prefix: Initial diagnosis Method of lymph node assessment: Clinical Histologic grading system: 3  grade system   12/12/2022 Genetic Testing   Negative [facility] CancerNext +RNAinsight Panel.  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 Report date is 12/12/2022.   The [employer] CancerNext+RNAinsight Panel includes sequencing, rearrangement analysis, and RNA analysis for the following 34 genes: APC, ATM, BARD1, BMPR1A, BRCA1, BRCA2, BRIP1, CDH1, CDK4, CDKN2A, CHEK2, DICER1, MLH1, MSH2, MSH6, MUTYH, NF1, NTHL1, PALB2, PMS2, PTEN, RAD51C, RAD51D, SMAD4, SMARCA4, STK11 and TP53 (sequencing and deletion/duplication); AXIN2, HOXB13, MSH3, POLD1 and POLE (sequencing only); EPCAM and GREM1 (deletion/duplication only).    The patient, with a history of breast cancer, presents with increased dizziness and equilibrium issues. She reports feeling nauseous frequently. She has been started on thyroid medication and has had a Tepezza infusion since her last visit. She is also on letrozole for her breast cancer. The patient is unsure which of these changes may be contributing to her current symptoms.  The patient also reports dry mouth, for which she is using a biotin saliva substitute. She has experienced some night sweats, which she believes may be a side effect of the letrozole.  The patient's blood pressure has been running high, and she recently had a change in her medication. She was instructed to stop her amlodipine and start losartan, but misunderstood and stopped both medications. She has since restarted the amlodipine and is waiting for further instructions from her cardiologist.  The patient also mentions a history of double vision, which she believes may be contributing to her dizziness.  Rest of the pertinent 10 point reviewed and negative  MEDICAL HISTORY:  Past Medical History:  Diagnosis Date   Arthritis    Atrial fibrillation (HCC)    Breast cancer (HCC)    Cancer (HCC)    Basal cell many times on  face   CHF (congestive heart failure) (HCC)    Coronary artery disease    Dyspnea    sob when walking  stairs   Dysrhythmia    AF/SSS/NSR   Fibroid    Hypertension    Menopausal symptoms    Osteoporosis 2018   T score -2.8 stable/improved from prior DEXA   Polymyalgia (HCC)    Presence of permanent cardiac pacemaker     SURGICAL HISTORY: Past Surgical History:  Procedure Laterality Date   BASAL CELL CARCINOMA EXCISION     BREAST SURGERY Left 1958   Breast Bx   CARDIAC CATHETERIZATION  2005   CARDIAC ELECTROPHYSIOLOGY STUDY AND ABLATION     FLEXIBLE SIGMOIDOSCOPY N/A 10/27/2016   Procedure: FLEXIBLE SIGMOIDOSCOPY;  Surgeon: Wyline Mood, MD;  Location: [facility] ENDOSCOPY;  Service: Gastroenterology;  Laterality: N/A;   HYSTEROSCOPY     X 2   IMPLANTABLE CARDIOVERTER DEFIBRILLATOR (ICD) GENERATOR CHANGE N/A 05/01/2018   Procedure: PACEMAKER BATTERY CHANGE CURRENT=ST. JUDE, NEW=MEDTRONIC;  Surgeon: Marcina Millard, MD;  Location: [facility] ORS;  Service: Cardiovascular;  Laterality: N/A;   INSERT / REPLACE / REMOVE PACEMAKER     SKIN BIOPSY     basal cell removed many times    SOCIAL HISTORY: Social History   Socioeconomic History   Marital status: Widowed    Spouse name: Not on file   Number of children: Not on file   Years of education: Not on file   Highest education level: Not on file  Occupational History   Occupation: RN    Comment: retired  Tobacco Use   Smoking status: Never   Smokeless tobacco: Never  Vaping Use   Vaping status: Never Used  Substance and Sexual Activity   Alcohol use: Yes    Alcohol/week: 4.0 standard drinks of alcohol    Types: 4 Standard drinks or equivalent per week   Drug use: No   Sexual activity: Not Currently    Birth control/protection: Post-menopausal    Comment: 1st intercourse 17 yo-1 partner  Other Topics Concern   Not on file  Social History Narrative   Not on file   Social Determinants of Health   Financial Resource Strain: Low Risk  (10/20/2022)   Received from [facility]   Overall Financial Resource  Strain (CARDIA)    Difficulty of Paying Living Expenses: Not hard at all  Food Insecurity: No Food Insecurity (12/01/2022)   Hunger Vital Sign    Worried About Running Out of Food in the Last Year: Never true    Ran Out of Food in the Last Year: Never true  Transportation Needs: No Transportation Needs (12/01/2022)   PRAPARE - Administrator, Civil Service (Medical): No    Lack of Transportation (Non-Medical): No  Physical Activity: Not on file  Stress: Not on file  Social Connections: Not on file  Intimate Partner Violence: Not At Risk (12/01/2022)   Humiliation, Afraid, Rape, and Kick questionnaire    Fear of Current or Ex-Partner: No    Emotionally Abused: No    Physically Abused: No    Sexually Abused: No    FAMILY HISTORY: Family History  Problem Relation Age of Onset   Hypertension Mother    Heart disease Mother    Heart disease Father    Osteoporosis Father     ALLERGIES:  is allergic to moxifloxacin and tobramycin.  MEDICATIONS:  Current Outpatient Medications  Medication Sig Dispense Refill   acetaminophen (TYLENOL) 500 MG tablet  Take 500 mg by mouth every 6 (six) hours as needed for moderate pain.     amLODipine (NORVASC) 5 MG tablet Take 1 tablet (5 mg total) by mouth daily. 90 tablet 1   apixaban (ELIQUIS) 2.5 MG TABS tablet Take 1 tablet (2.5 mg total) by mouth 2 (two) times daily. 180 tablet 1   Biotin 1 MG CAPS Take 1 capsule by mouth daily. (Patient not taking: Reported on 10/11/2022)     cyanocobalamin (VITAMIN B12) 1000 MCG/ML injection Inject 1,000 mcg into the muscle every 30 (thirty) days.     letrozole (FEMARA) 2.5 MG tablet Take 1 tablet (2.5 mg total) by mouth daily. 90 tablet 3   losartan (COZAAR) 25 MG tablet Take 1 tablet (25 mg total) by mouth daily. 90 tablet 3   Melatonin 10 MG TABS Take 10 mg by mouth as needed.     methimazole (TAPAZOLE) 5 MG tablet Take 5 mg by mouth daily.     No current facility-administered medications for this  visit.    PHYSICAL EXAMINATION: ECOG PERFORMANCE STATUS: 0 - Asymptomatic  Vitals:   03/05/23 1257  BP: (!) 176/74  Pulse: 72  Resp: 18  Temp: (!) 97.3 F (36.3 C)  SpO2: 96%    Filed Weights   03/05/23 1257  Weight: 116 lb 12.8 oz (53 kg)    Right breast: No obvious palpable mass. No regional adenopathy.  LABORATORY DATA:  I have reviewed the data as listed Lab Results  Component Value Date   WBC 6.0 11/29/2022   HGB 13.9 11/29/2022   HCT 42.3 11/29/2022   MCV 98.1 11/29/2022   PLT 153 11/29/2022   Lab Results  Component Value Date   NA 142 11/29/2022   K 4.7 11/29/2022   CL 104 11/29/2022   CO2 30 11/29/2022    RADIOGRAPHIC STUDIES: I have personally reviewed the radiological reports and agreed with the findings in the report.  ASSESSMENT AND PLAN:   Malignant neoplasm of upper-outer quadrant of right breast in female, estrogen receptor positive (HCC) This is a very pleasant 89 year old female patient with newly diagnosed right breast invasive ductal carcinoma, grade 2, ER/PR positive HER2 negative referred to breast [facility] for additional recommendations.  During her initial visit, we have discussed about SOC being surgery followed by adjuvant antiestrogen therapy.  Patient gave me permission to speak to Dr. Caswell Corwin During Labor Day weekend have gotten a phone call from Dr. Caswell Corwin at [facility] who happens to be a very good family friend of Ms. Ragonese and apparently the patient expressed concern over going off of anticoagulation for surgery and wanted to proceed more conservatively.  Hence we have discussed about considering antiestrogen therapy alone. She is now on letrozole.  Breast Cancer Right breast upper outer quadrant lesion diagnosed in August 2024. Started on Letrozole in September 2024. No palpable mass on today's exam. Night sweats reported, likely side effect of Letrozole. -Continue Letrozole. -Order mammogram for spring 2025 (March-April) to assess response  to treatment.  Hypertension Recent change in antihypertensive regimen with addition of Losartan and temporary cessation of Amlodipine, leading to increased blood pressure and increased dizziness. -Resume Amlodipine as advised by cardiology. -Cardiology to monitor blood pressure adjustments.  Thyroid Disease Recently started on thyroid medication. -Continue current management.  Equilibrium Issues/Dizziness Increased dizziness reported, possibly related to multiple medication changes (thyroid, Letrozole, antihypertensives) and double vision. -Advise to use a walker or cane for safety due to dizziness and double vision.  Dry Mouth Using Biotene  saliva substitute for symptom management. -Continue current management.  Follow-up Plan to see patient in spring 2025 after mammogram results.   I connected with  [name] on 03/05/23 by a telephone application and verified that I am speaking with the correct person using two identifiers.   I discussed the limitations of evaluation and management by telemedicine. The patient expressed understanding and agreed to proceed.  Total time: 20 min  All questions were answered. The patient knows to call the clinic with any problems, questions or concerns.    Rachel Moulds, MD 03/05/23

## 2023-03-05 NOTE — Assessment & Plan Note (Signed)
This is a very pleasant 89 year old female patient with newly diagnosed right breast invasive ductal carcinoma, grade 2, ER/PR positive HER2 negative referred to breast [facility] for additional recommendations.  During her initial visit, we have discussed about SOC being surgery followed by adjuvant antiestrogen therapy.  Patient gave me permission to speak to Dr. Caswell Corwin During Labor Day weekend have gotten a phone call from Dr. Caswell Corwin at [facility] who happens to be a very good family friend of Ms. Perl and apparently the patient expressed concern over going off of anticoagulation for surgery and wanted to proceed more conservatively.  Hence we have discussed about considering antiestrogen therapy alone. She is now on letrozole.  Breast Cancer Right breast upper outer quadrant lesion diagnosed in August 2024. Started on Letrozole in September 2024. No palpable mass on today's exam. Night sweats reported, likely side effect of Letrozole. -Continue Letrozole. -Order mammogram for spring 2025 (March-April) to assess response to treatment.  Hypertension Recent change in antihypertensive regimen with addition of Losartan and temporary cessation of Amlodipine, leading to increased blood pressure and increased dizziness. -Resume Amlodipine as advised by cardiology. -Cardiology to monitor blood pressure adjustments.  Thyroid Disease Recently started on thyroid medication. -Continue current management.  Equilibrium Issues/Dizziness Increased dizziness reported, possibly related to multiple medication changes (thyroid, Letrozole, antihypertensives) and double vision. -Advise to use a walker or cane for safety due to dizziness and double vision.  Dry Mouth Using Biotene saliva substitute for symptom management. -Continue current management.  Follow-up Plan to see patient in spring 2025 after mammogram results.

## 2023-03-05 NOTE — Telephone Encounter (Addendum)
Returned the call to the patient. She recently had an office visit on 11/7 where the chlorthalidone was discontinued and Losartan 25 mg once daily was started.  She did not start the Losartan until 8 days ago due to being out of town. She has also not been taking her Amlodipine 5 mg once daily. She thought she was supposed to stop this.  She has been advised to take her Amlodipine and we will call her back in an hour or two to check on her. Blood pressure while on the phone was 176/118 and heart rate was 58. The patient was asymptomatic except for minor dizziness.   Patient made aware of ED precautions should new or worsening symptoms occur before we call back. Patient verbalized understanding.

## 2023-03-08 ENCOUNTER — Other Ambulatory Visit: Payer: Self-pay | Admitting: *Deleted

## 2023-03-08 ENCOUNTER — Telehealth: Payer: Self-pay | Admitting: Cardiology

## 2023-03-08 LAB — BASIC METABOLIC PANEL
BUN/Creatinine Ratio: 16 (ref 12–28)
BUN: 13 mg/dL (ref 10–36)
CO2: 22 mmol/L (ref 20–29)
Calcium: 9.9 mg/dL (ref 8.7–10.3)
Chloride: 106 mmol/L (ref 96–106)
Creatinine, Ser: 0.82 mg/dL (ref 0.57–1.00)
Glucose: 92 mg/dL (ref 70–99)
Potassium: 5.4 mmol/L — ABNORMAL HIGH (ref 3.5–5.2)
Sodium: 142 mmol/L (ref 134–144)
eGFR: 66 mL/min/{1.73_m2} (ref >59)

## 2023-03-08 MED ORDER — AMLODIPINE BESYLATE 10 MG PO TABS: 10.0000 mg | ORAL_TABLET | Freq: Every day | ORAL | 3 refills | Status: DC

## 2023-03-08 MED ORDER — LOSARTAN POTASSIUM 25 MG PO TABS: 12.5000 mg | ORAL_TABLET | Freq: Every day | ORAL | 3 refills | Status: DC

## 2023-03-08 NOTE — Telephone Encounter (Signed)
Spoke with patient and discussed increase in her amlodipine to 10 mg once daily. Encouraged her to monitor her blood pressure readings 2 hours after medications. She verbalized understanding with no further questions at this time.

## 2023-03-08 NOTE — Telephone Encounter (Signed)
Attempted to reach to the patient. She was unable to hear. She stated that she will call back.    Debbe Odea, MD  You; Bryna Colander, RN3 hours ago (9:31 AM)    Patient's creatinine on last check is slightly elevated.  Recommend increasing Norvasc to 10 mg daily, reduce losartan to 12.5 mg daily.

## 2023-03-08 NOTE — Telephone Encounter (Signed)
Patient returned RN's call regarding results. 

## 2023-03-08 NOTE — Telephone Encounter (Signed)
Duplicate

## 2023-03-14 ENCOUNTER — Emergency Department (HOSPITAL_COMMUNITY)
Admission: EM | Admit: 2023-03-14 | Discharge: 2023-03-14 | Disposition: A | Discharge status: Home / Self Care | Payer: Medicare Other | Attending: Emergency Medicine | Admitting: Emergency Medicine

## 2023-03-14 ENCOUNTER — Encounter (HOSPITAL_COMMUNITY): Payer: Self-pay | Admitting: Emergency Medicine

## 2023-03-14 ENCOUNTER — Emergency Department (HOSPITAL_COMMUNITY): Payer: Medicare Other

## 2023-03-14 DIAGNOSIS — W01190A Fall on same level from slipping, tripping and stumbling with subsequent striking against furniture, initial encounter: Secondary | ICD-10-CM | POA: Insufficient documentation

## 2023-03-14 DIAGNOSIS — I2510 Atherosclerotic heart disease of native coronary artery without angina pectoris: Secondary | ICD-10-CM | POA: Insufficient documentation

## 2023-03-14 DIAGNOSIS — S0101XA Laceration without foreign body of scalp, initial encounter: Secondary | ICD-10-CM | POA: Insufficient documentation

## 2023-03-14 DIAGNOSIS — W19XXXA Unspecified fall, initial encounter: Secondary | ICD-10-CM

## 2023-03-14 DIAGNOSIS — Z79899 Other long term (current) drug therapy: Secondary | ICD-10-CM | POA: Insufficient documentation

## 2023-03-14 DIAGNOSIS — Z7901 Long term (current) use of anticoagulants: Secondary | ICD-10-CM | POA: Diagnosis not present

## 2023-03-14 DIAGNOSIS — Z85828 Personal history of other malignant neoplasm of skin: Secondary | ICD-10-CM | POA: Diagnosis not present

## 2023-03-14 DIAGNOSIS — I4891 Unspecified atrial fibrillation: Secondary | ICD-10-CM | POA: Insufficient documentation

## 2023-03-14 DIAGNOSIS — Z853 Personal history of malignant neoplasm of breast: Secondary | ICD-10-CM | POA: Insufficient documentation

## 2023-03-14 DIAGNOSIS — S0990XA Unspecified injury of head, initial encounter: Secondary | ICD-10-CM | POA: Diagnosis present

## 2023-03-14 LAB — CBC WITH DIFFERENTIAL/PLATELET
Abs Immature Granulocytes: 0.03 10*3/uL (ref 0.00–0.07)
Basophils Absolute: 0 10*3/uL (ref 0.0–0.1)
Basophils Relative: 0 %
Eosinophils Absolute: 0.1 10*3/uL (ref 0.0–0.5)
Eosinophils Relative: 1 %
HCT: 44.2 % (ref 36.0–46.0)
Hemoglobin: 14.8 g/dL (ref 12.0–15.0)
Immature Granulocytes: 0 %
Lymphocytes Relative: 28 %
Lymphs Abs: 2.4 10*3/uL (ref 0.7–4.0)
MCH: 33.7 pg (ref 26.0–34.0)
MCHC: 33.5 g/dL (ref 30.0–36.0)
MCV: 100.7 fL — ABNORMAL HIGH (ref 80.0–100.0)
Monocytes Absolute: 1 10*3/uL (ref 0.1–1.0)
Monocytes Relative: 12 %
Neutro Abs: 4.9 10*3/uL (ref 1.7–7.7)
Neutrophils Relative %: 59 %
Platelets: 199 10*3/uL (ref 150–400)
RBC: 4.39 MIL/uL (ref 3.87–5.11)
RDW: 13.2 % (ref 11.5–15.5)
WBC: 8.4 10*3/uL (ref 4.0–10.5)
nRBC: 0 % (ref 0.0–0.2)

## 2023-03-14 LAB — BASIC METABOLIC PANEL
Anion gap: 12 (ref 5–15)
BUN: 23 mg/dL (ref 8–23)
CO2: 21 mmol/L — ABNORMAL LOW (ref 22–32)
Calcium: 9.8 mg/dL (ref 8.9–10.3)
Chloride: 105 mmol/L (ref 98–111)
Creatinine, Ser: 0.78 mg/dL (ref 0.44–1.00)
GFR, Estimated: >60 mL/min (ref >60)
Glucose, Bld: 98 mg/dL (ref 70–99)
Potassium: 4.2 mmol/L (ref 3.5–5.1)
Sodium: 138 mmol/L (ref 135–145)

## 2023-03-14 MED ORDER — LIDOCAINE-EPINEPHRINE (PF) 2 %-1:200000 IJ SOLN
10.0000 mL | Freq: Once | INTRAMUSCULAR | Status: AC
  Administered 2023-03-14: 10 mL
  Filled 2023-03-14: qty 20

## 2023-03-14 MED ORDER — ACETAMINOPHEN 500 MG PO TABS
1000.0000 mg | ORAL_TABLET | Freq: Once | ORAL | Status: AC
  Administered 2023-03-14: 1000 mg via ORAL
  Filled 2023-03-14: qty 2

## 2023-03-14 NOTE — Progress Notes (Signed)
Orthopedic Tech Progress Note Patient Details:  [name] [birth year] [id number]  Responded to level 2 trauma, not needed at this time   Patient ID: [name], female   DOB: Mar 16, 1927, 89 y.o.   MRN: [id number]  [name] 03/14/2023, 7:07 PM

## 2023-03-14 NOTE — Progress Notes (Signed)
Remote pacemaker transmission.   

## 2023-03-14 NOTE — ED Notes (Signed)
Patient ambulated with assistance without difficulty, which is her baseline. She uses a walker at home.

## 2023-03-14 NOTE — Discharge Instructions (Addendum)
Staples need to be removed in 10 days.  Hold Eliquis tonight and tomorrow morning.

## 2023-03-14 NOTE — Progress Notes (Signed)
   03/14/23 1900  Spiritual Encounters  Type of Visit Initial  Care provided to: Patient  Conversation partners present during encounter Nurse  Referral source Trauma page  Reason for visit Trauma  OnCall Visit Yes   Chaplain responded to trauma level II page. There was no family present at bedside. Chaplain provided support to patient and staff. No follow-up is needed.

## 2023-03-14 NOTE — ED Provider Notes (Signed)
[place] EM[facility] DE[facility]MENT AT [facility] Provider Note   CSN: [id number] Arrival date & time: 03/14/23  1902     History  Chief Complaint  Patient presents with   [name] is a 89 y.o. female.  Pt is a 89 yo with pmhx significant for afib (on eliquis), cad, chf, skin cancer, htn, and breast cancer.  Pt was rushing b/c her son in law was picking her up for a holiday party.  She was going to fast and fell.  She hit her head on a table.  She did sustain a lac to the left side of her head.  No loc.  No other complaints of pain.  Level 2 trauma called by EMS.       Home Medications Prior to Admission medications   Medication Sig Start Date End Date Taking? Authorizing Provider  acetaminophen (TYLENOL) 500 MG tablet Take 500 mg by mouth every 6 (six) hours as needed for moderate pain.    [provider]  amLODipine (NORVASC) 10 MG tablet Take 1 tablet (10 mg total) by mouth daily. 03/08/23 06/06/23  Debbe Odea, MD  apixaban (ELIQUIS) 2.5 MG TABS tablet Take 1 tablet (2.5 mg total) by mouth 2 (two) times daily. 07/24/22   Lanier Prude, MD  Biotin 1 MG CAPS Take 1 capsule by mouth daily. Patient not taking: Reported on 10/11/2022    [provider]  cyanocobalamin (VITAMIN B12) 1000 MCG/ML injection Inject 1,000 mcg into the muscle every 30 (thirty) days. 09/23/18   [provider]  letrozole (FEMARA) 2.5 MG tablet Take 1 tablet (2.5 mg total) by mouth daily. 12/12/22   Rachel Moulds, MD  losartan (COZAAR) 25 MG tablet Take 0.5 tablets (12.5 mg total) by mouth daily. 03/08/23 06/06/23  Debbe Odea, MD  Melatonin 10 MG TABS Take 10 mg by mouth as needed.    [provider]  methimazole (TAPAZOLE) 5 MG tablet Take 5 mg by mouth daily.    [provider]      Allergies    Moxifloxacin and Tobramycin    Review of Systems   Review of Systems  Skin:  Positive for wound.  Neurological:  Positive  for headaches.  All other systems reviewed and are negative.   Physical Exam Updated Vital Signs BP (!) 168/75   Pulse (!) 55   Temp 97.8 F (36.6 C)   Resp 16   Wt 58 kg   SpO2 98%   BMI 23.39 kg/m  Physical Exam Vitals and nursing note reviewed.  Constitutional:      Appearance: Normal appearance.  HENT:     Head: Normocephalic.     Right Ear: External ear normal.     Left Ear: External ear normal.     Nose: Nose normal.     Mouth/Throat:     Mouth: Mucous membranes are moist.     Pharynx: Oropharynx is clear.  Eyes:     Extraocular Movements: Extraocular movements intact.     Conjunctiva/sclera: Conjunctivae normal.     Pupils: Pupils are equal, round, and reactive to light.  Cardiovascular:     Rate and Rhythm: Normal rate and regular rhythm.     Pulses: Normal pulses.     Heart sounds: Normal heart sounds.  Pulmonary:     Effort: Pulmonary effort is normal.     Breath sounds: Normal breath sounds.  Abdominal:     General: Abdomen is flat. Bowel sounds  are normal.     Palpations: Abdomen is soft.  Musculoskeletal:        General: Normal range of motion.     Cervical back: Normal range of motion and neck supple.  Skin:    General: Skin is warm.     Capillary Refill: Capillary refill takes less than 2 seconds.  Neurological:     General: No focal deficit present.     Mental Status: She is alert and oriented to person, place, and time.  Psychiatric:        Mood and Affect: Mood normal.        Behavior: Behavior normal.     ED Results / Procedures / Treatments   Labs (all labs ordered are listed, but only abnormal results are displayed) Labs Reviewed  BASIC METABOLIC PANEL - Abnormal; Notable for the following components:      Result Value   CO2 21 (*)    All other components within normal limits  CBC WITH DIFFERENTIAL/PLATELET - Abnormal; Notable for the following components:   MCV 100.7 (*)    All other components within normal limits     EKG None  Radiology DG Pelvis Portable  Result Date: 03/14/2023 CLINICAL DATA:  Fall, on blood thinners EXAM: PORTABLE PELVIS 1-2 VIEWS COMPARISON:  None Available. FINDINGS: No fracture or dislocation is seen. Bilateral hip joint spaces are preserved. Mild irregularity of the left inferior pubic ramus favors an old/chronic deformity. Mild degenerative changes of the lower lumbar spine. IMPRESSION: Negative. Electronically Signed   By: Charline Bills M.D.   On: 03/14/2023 20:21   DG Chest Portable 1 View  Result Date: 03/14/2023 CLINICAL DATA:  Fall, on blood thinners EXAM: PORTABLE CHEST 1 VIEW COMPARISON:  04/29/2018 FINDINGS: Lungs are clear.  No pleural effusion or pneumothorax. Cardiomegaly.  Left subclavian pacemaker. Degenerative changes of the bilateral shoulders. IMPRESSION: Cardiomegaly. No acute cardiopulmonary disease. Electronically Signed   By: Charline Bills M.D.   On: 03/14/2023 20:20   CT Head Wo Contrast  Result Date: 03/14/2023 CLINICAL DATA:  Polytrauma, blunt.  Fall. EXAM: CT HEAD WITHOUT CONTRAST CT CERVICAL SPINE WITHOUT CONTRAST TECHNIQUE: Multidetector CT imaging of the head and cervical spine was performed following the standard protocol without intravenous contrast. Multiplanar CT image reconstructions of the cervical spine were also generated. RADIATION DOSE REDUCTION: This exam was performed according to the departmental dose-optimization program which includes automated exposure control, adjustment of the mA and/or kV according to patient size and/or use of iterative reconstruction technique. COMPARISON:  CT head 08/04/2022 FINDINGS: CT HEAD FINDINGS Brain: There is no evidence of an acute infarct, intracranial hemorrhage, mass, midline shift, or extra-axial fluid collection. There is mild cerebral atrophy. There is a background of mild chronic small vessel ischemia in the cerebral white matter with chronic lacunar infarcts again seen involving the deep  white matter and basal ganglia. Vascular: Calcified atherosclerosis at the skull base. No hyperdense vessel. Skull: No acute fracture or suspicious osseous lesion. Sinuses/Orbits: No acute finding. Other: Moderate-sized left parietal scalp hematoma. CT CERVICAL SPINE FINDINGS Alignment: Exaggerated cervical lordosis.  No significant listhesis. Skull base and vertebrae: No acute fracture or suspicious osseous lesion. Soft tissues and spinal canal: No prevertebral fluid or swelling. No visible canal hematoma. Disc levels: Mild cervical spondylosis and moderate facet arthrosis. Capacious spinal canal. Upper chest: Biapical pleuroparenchymal lung scarring. Other: Atrophic or absent left submandibular gland. Mild atherosclerosis at the carotid bifurcations. Multiple subcentimeter thyroid nodules for which no follow-up imaging is recommended.  IMPRESSION: 1. No evidence of acute intracranial abnormality or cervical spine fracture. 2. Left parietal scalp hematoma. Electronically Signed   By: Sebastian Ache M.D.   On: 03/14/2023 19:39   CT Cervical Spine Wo Contrast  Result Date: 03/14/2023 CLINICAL DATA:  Polytrauma, blunt.  Fall. EXAM: CT HEAD WITHOUT CONTRAST CT CERVICAL SPINE WITHOUT CONTRAST TECHNIQUE: Multidetector CT imaging of the head and cervical spine was performed following the standard protocol without intravenous contrast. Multiplanar CT image reconstructions of the cervical spine were also generated. RADIATION DOSE REDUCTION: This exam was performed according to the departmental dose-optimization program which includes automated exposure control, adjustment of the mA and/or kV according to patient size and/or use of iterative reconstruction technique. COMPARISON:  CT head 08/04/2022 FINDINGS: CT HEAD FINDINGS Brain: There is no evidence of an acute infarct, intracranial hemorrhage, mass, midline shift, or extra-axial fluid collection. There is mild cerebral atrophy. There is a background of mild chronic  small vessel ischemia in the cerebral white matter with chronic lacunar infarcts again seen involving the deep white matter and basal ganglia. Vascular: Calcified atherosclerosis at the skull base. No hyperdense vessel. Skull: No acute fracture or suspicious osseous lesion. Sinuses/Orbits: No acute finding. Other: Moderate-sized left parietal scalp hematoma. CT CERVICAL SPINE FINDINGS Alignment: Exaggerated cervical lordosis.  No significant listhesis. Skull base and vertebrae: No acute fracture or suspicious osseous lesion. Soft tissues and spinal canal: No prevertebral fluid or swelling. No visible canal hematoma. Disc levels: Mild cervical spondylosis and moderate facet arthrosis. Capacious spinal canal. Upper chest: Biapical pleuroparenchymal lung scarring. Other: Atrophic or absent left submandibular gland. Mild atherosclerosis at the carotid bifurcations. Multiple subcentimeter thyroid nodules for which no follow-up imaging is recommended. IMPRESSION: 1. No evidence of acute intracranial abnormality or cervical spine fracture. 2. Left parietal scalp hematoma. Electronically Signed   By: Sebastian Ache M.D.   On: 03/14/2023 19:39    Procedures .Laceration Repair  Date/Time: 03/14/2023 9:09 PM  Performed by: Jacalyn Lefevre, MD Authorized by: Jacalyn Lefevre, MD   Consent:    Consent obtained:  Verbal   Consent given by:  Patient   Alternatives discussed:  No treatment Universal protocol:    Patient identity confirmed:  Verbally with patient Anesthesia:    Anesthesia method:  Local infiltration   Local anesthetic:  Lidocaine 2% WITH epi Laceration details:    Location:  Scalp   Scalp location:  L parietal   Length (cm):  3 Pre-procedure details:    Preparation:  Patient was prepped and draped in usual sterile fashion Exploration:    Hemostasis achieved with:  Epinephrine   Contaminated: no   Treatment:    Area cleansed with:  Povidone-iodine   Irrigation solution:  Sterile saline    Debridement:  None Skin repair:    Repair method:  Staples   Number of staples:  8 Approximation:    Approximation:  Close Repair type:    Repair type:  Simple Post-procedure details:    Dressing:  Antibiotic ointment   Procedure completion:  Tolerated well, no immediate complications     Medications Ordered in ED Medications  acetaminophen (TYLENOL) tablet 1,000 mg (has no administration in time range)  lidocaine-EPINEPHrine (XYLOCAINE W/EPI) 2 %-1:200000 (PF) injection 10 mL (10 mLs Infiltration Given by Other 03/14/23 2000)    ED Course/ Medical Decision Making/ A&P  Medical Decision Making Amount and/or Complexity of Data Reviewed Labs: ordered. Radiology: ordered.  Risk OTC drugs. Prescription drug management.   This patient presents to the ED for concern of fall, this involves an extensive number of treatment options, and is a complaint that carries with it a high risk of complications and morbidity.  The differential diagnosis includes multiple trauma   Co morbidities that complicate the patient evaluation  afib (on eliquis), cad, chf, skin cancer, htn, and breast cancer   Additional history obtained:  Additional history obtained from epic chart review External records from outside source obtained and reviewed including EMS report   Lab Tests:  I Ordered, and personally interpreted labs.  The pertinent results include:  cbc nl, bmp nl   Imaging Studies ordered:  I ordered imaging studies including ct head/ct c-spine, cxr, pelvis  I independently visualized and interpreted imaging which showed  CT head/c-spine: No evidence of acute intracranial abnormality or cervical spine  fracture.  2. Left parietal scalp hematoma.  CXR: Cardiomegaly.    No acute cardiopulmonary disease.   Pelvis:  Negative.  I agree with the radiologist interpretation  Medicines ordered and prescription drug management:  I ordered  medication including tylenol  for sx  Reevaluation of the patient after these medicines showed that the patient improved I have reviewed the patients home medicines and have made adjustments as needed   Test Considered:  ct   Critical Interventions:  ct   Problem List / ED Course:  Fall on thinners:  no internal injury.  Pt able to ambulate. Lac:  stapled   Reevaluation:  After the interventions noted above, I reevaluated the patient and found that they have :improved   Social Determinants of Health:  Lives at home   Dispostion:  After consideration of the diagnostic results and the patients response to treatment, I feel that the patent would benefit from discharge with outpatient f/u.          Final Clinical Impression(s) / ED Diagnoses Final diagnoses:  Fall, initial encounter  Laceration of scalp, initial encounter    Rx / DC Orders ED Discharge Orders     None         Jacalyn Lefevre, MD 03/14/23 2112

## 2023-03-14 NOTE — ED Notes (Signed)
ED Provider at bedside. 

## 2023-03-15 NOTE — ED Notes (Addendum)
Trauma Response Nurse Documentation   [name] is a 89 y.o. female arriving to [facility] ED via EMS  On Eliquis (apixaban) daily. Trauma was activated as a Level 2 by ED charge RN based on the following trauma criteria Elderly patients > 65 with head trauma on anti-coagulation (excluding ASA).  Patient cleared for CT by Dr. Particia Nearing EDP. Pt transported to CT with trauma response nurse present to monitor. RN remained with the patient throughout their absence from the department for clinical observation.   GCS 15.  History   Past Medical History:  Diagnosis Date   Arthritis    Atrial fibrillation (HCC)    Breast cancer (HCC)    Cancer (HCC)    Basal cell many times on face   CHF (congestive heart failure) (HCC)    Coronary artery disease    Dyspnea    sob when walking stairs   Dysrhythmia    AF/SSS/NSR   Fibroid    Hypertension    Menopausal symptoms    Osteoporosis 2018   T score -2.8 stable/improved from prior DEXA   Polymyalgia (HCC)    Presence of permanent cardiac pacemaker      Past Surgical History:  Procedure Laterality Date   BASAL CELL CARCINOMA EXCISION     BREAST SURGERY Left 1958   Breast Bx   CARDIAC CATHETERIZATION  2005   CARDIAC ELECTROPHYSIOLOGY STUDY AND ABLATION     FLEXIBLE SIGMOIDOSCOPY N/A 10/27/2016   Procedure: FLEXIBLE SIGMOIDOSCOPY;  Surgeon: Wyline Mood, MD;  Location: [facility] ENDOSCOPY;  Service: Gastroenterology;  Laterality: N/A;   HYSTEROSCOPY     X 2   IMPLANTABLE CARDIOVERTER DEFIBRILLATOR (ICD) GENERATOR CHANGE N/A 05/01/2018   Procedure: PACEMAKER BATTERY CHANGE CURRENT=[facility], NEW=[employer];  Surgeon: Marcina Millard, MD;  Location: [facility] ORS;  Service: Cardiovascular;  Laterality: N/A;   INSERT / REPLACE / REMOVE PACEMAKER     SKIN BIOPSY     basal cell removed many times       Initial Focused Assessment (If applicable, or please see trauma documentation): Alert/oriented female presents via EMS from home after a fall  while getting ready to go out, wound to left side of her head with bleeding controlled.   Airway patent/unobstructed, BS clear Bleeding to head lac controlled GCS 15 PERRLA 2  CT's Completed:   CT Head and CT C-Spine   Interventions:  IV start, trauma lab draw Portable chest and pelvis XRAY CT head and c-spine Wound care, lac repair 8 staples by EDP Tylenol for pain control No tetanus status documented   Plan for disposition:  Discharge home   Consults completed:  none   Event Summary: Fall with left sided head injury, bleeding controlled. Escorted to CT and back. Trauma scans unremarkable, d/c home following lac repair.  MTP Summary (If applicable): NA  Bedside handoff with ED RN Brayton Caves.    Merica Prell O Cheryl Chay  Trauma Response RN  Please call TRN at [phone] for further assistance.

## 2023-05-08 ENCOUNTER — Telehealth: Payer: Self-pay

## 2023-05-09 ENCOUNTER — Telehealth: Payer: Self-pay

## 2023-05-09 NOTE — Telephone Encounter (Signed)
 Unscheduled manual transmission received.  Normal device function.  Persistent AF since 1/23, controlled rates, burden 28%, Eliquis  per PA report - route to triage 4 NSVT, 6-7 beats in duration, 1 EGM c/w atrial driven tachycardia 3 SVT, 12-22sec in duration, HR's 140-153 Follow up as scheduled LA, CVRS   Patient is due this month for follow up with APPs here in clinic. Will forward to EP scheduling team to get her set up for follow up.

## 2023-05-09 NOTE — Telephone Encounter (Signed)
 [name]

## 2023-05-10 NOTE — Progress Notes (Signed)
 Electrophysiology Clinic Note    Date:  05/11/2023  Patient ID:  [name], [name] 04/27/1926, MRN [id number] PCP:  Fernande Ophelia JINNY DOUGLAS, MD  Cardiologist:  Redell Cave, MD Electrophysiologist: OLE ONEIDA HOLTS, MD   Discussed the use of AI scribe software for clinical note transcription with the patient, who gave verbal consent to proceed.   Patient Profile    Chief Complaint: AFib, PPM follow-up  History of Present Illness: [name] is a 89 y.o. female with PMH notable for persis AFib, SSS s/p PPM, HTN, hyperthyroid, CKD -3a; seen today for OLE ONEIDA HOLTS, MD for routine electrophysiology followup.  She last saw Dr. Holts 05/2022 to transfer PPM care to [facility] office.  She saw PA Furth earlier today. She is planning updated labwork and starting lasix  for lower extremity edema.   On follow-up today, patient c/o increased lower extremity edema, L>R. She has also  noticed increased dizziness. She did have a mechanical fall a few months ago where she hit her head.   She diligently takes eliquis  BID, no missed doses. She and her family are traveling to [place] next week for relaxing vacation.     she denies chest pain, palpitations, dyspnea, PND, orthopnea, nausea, vomiting, syncope, weight gain, or early satiety.     Device Information: MDT dual chamber PPM, imp 09/2008; dx SSS, afib St. Jude RA and RV leads  AAD History: none    ROS:  Please see the history of present illness. All other systems are reviewed and otherwise negative.    Physical Exam    VS:  There were no vitals taken for this visit. BMI: There is no height or weight on file to calculate BMI.     05/11/2023   10:39 AM 03/14/2023    8:00 PM 03/14/2023    7:45 PM  Vitals with BMI  Height 5' 2    Weight 116 lbs 10 oz    BMI 21.32    Systolic 140 168 824  Diastolic 68 75 67  Pulse 71 55 64     Wt Readings from Last 3 Encounters:  05/11/23 116 lb 9.6 oz (52.9 kg)  03/14/23 127  lb 13.9 oz (58 kg)  03/05/23 116 lb 12.8 oz (53 kg)     GEN- The patient is well appearing, alert and oriented x 3 today.   Lungs- Clear to ausculation bilaterally, normal work of breathing.  Heart- Regular rate and rhythm, no murmurs, rubs or gallops Extremities- 2-3+ peripheral edema, warm, dry Skin-  device pocket well-healed, no tethering   Device interrogation done today and reviewed by myself:  Battery 6.8 years Lead thresholds, impedence, sensing stable  Presents in AFib, appears persistent since ~04/2023 No changes made today   Studies Reviewed   Previous EP, cardiology notes.    EKG is not ordered. Personal review of EKG from today shows: AFib with  VP at 71        TTE, 11/08/2022  1. Left ventricular ejection fraction, by estimation, is 55 to 60%. The left ventricle has normal function. The left ventricle has no regional wall motion abnormalities. There is severe asymmetric left ventricular hypertrophy of the basal-septal  segment. Left ventricular diastolic parameters are indeterminate. The average left ventricular global longitudinal strain is -15.2 %. The global longitudinal strain is abnormal.   2. Right ventricular systolic function is mildly reduced. The right ventricular size is normal. There is normal pulmonary artery systolic pressure.   3.  Left atrial size was severely dilated.   4. The mitral valve is normal in structure. Mild mitral valve regurgitation. No evidence of mitral stenosis. Moderate mitral annular calcification.   5. Tricuspid valve regurgitation is moderate.   6. The aortic valve is tricuspid. There is mild thickening of the aortic valve. Aortic valve regurgitation is mild to moderate.   7. There is borderline dilatation of the ascending aorta, measuring 38 mm.   8. The inferior vena cava is normal in size with greater than 50% respiratory variability, suggesting right atrial pressure of 3 mmHg.     Assessment and Plan     #) SND s/p  PPM Device functioning well, see pace art for details  #) persis AFib Persistent atrial fibrillation noted on device interrogation. No missed doses of Eliquis  reported. No bleeding complications noted. Patient is going out of town soon.  -Continue Eliquis  as prescribed. - ventricular rates well-controlled - Plan for cardioversion after return from vacation if still in atrial fibrillation. - Gen cards starting lasix  PRN. Reviewed safety with ambulating to bathroom  #) Hypercoag d/t persis afib CHA2DS2-VASc Score = at least 6 [CHF History: 1, HTN History: 1, Diabetes History: 0, Stroke History: 0, Vascular Disease History: 1, Age Score: 2, Gender Score: 1].  Therefore, the patient's annual risk of stroke is 9.7 %.    Stroke ppx - 2.5mg  eliquis  BID, dose reduced for age, weight No bleeding concerns Continue to monitor appropriateness of eliquis  iso falls, dizziness        Current medicines are reviewed at length with the patient today.   The patient has concerns regarding her medicines.  The following changes were made today:  none  Labs/ tests ordered today include:  No orders of the defined types were placed in this encounter. PA Furth ordered thyroid  labs, BNP, CMP,, will follow results   Disposition: Follow up with EP APP  in 2 weeks   Signed, Sharian Delia, NP  05/11/23  2:23 PM  Electrophysiology [facility]

## 2023-05-11 ENCOUNTER — Ambulatory Visit: Payer: Medicare Other | Admitting: Cardiology

## 2023-05-11 ENCOUNTER — Encounter: Payer: Self-pay | Admitting: Medical

## 2023-05-11 ENCOUNTER — Ambulatory Visit: Payer: Medicare Other | Attending: Medical | Admitting: Medical

## 2023-05-11 ENCOUNTER — Ambulatory Visit (INDEPENDENT_AMBULATORY_CARE_PROVIDER_SITE_OTHER): Payer: Medicare Other | Admitting: Cardiology

## 2023-05-11 VITALS — BP 140/68 | HR 71 | Ht 62.0 in | Wt 116.6 lb

## 2023-05-11 DIAGNOSIS — I4821 Permanent atrial fibrillation: Secondary | ICD-10-CM | POA: Insufficient documentation

## 2023-05-11 DIAGNOSIS — Z950 Presence of cardiac pacemaker: Secondary | ICD-10-CM

## 2023-05-11 DIAGNOSIS — I10 Essential (primary) hypertension: Secondary | ICD-10-CM | POA: Insufficient documentation

## 2023-05-11 DIAGNOSIS — I495 Sick sinus syndrome: Secondary | ICD-10-CM | POA: Diagnosis present

## 2023-05-11 DIAGNOSIS — R600 Localized edema: Secondary | ICD-10-CM | POA: Diagnosis not present

## 2023-05-11 DIAGNOSIS — I4819 Other persistent atrial fibrillation: Secondary | ICD-10-CM | POA: Insufficient documentation

## 2023-05-11 LAB — CUP PACEART INCLINIC DEVICE CHECK
Date Time Interrogation Session: 20250207162316
Implantable Lead Connection Status: 753985
Implantable Lead Connection Status: 753985
Implantable Lead Implant Date: 20100625
Implantable Lead Implant Date: 20100625
Implantable Lead Location: 753859
Implantable Lead Location: 753862
Implantable Pulse Generator Implant Date: 20200129

## 2023-05-11 MED ORDER — FUROSEMIDE 20 MG PO TABS: 20.0000 mg | ORAL_TABLET | Freq: Every day | ORAL | 3 refills | Status: DC

## 2023-05-11 MED ORDER — POTASSIUM CHLORIDE CRYS ER 10 MEQ PO TBCR: 10.0000 meq | EXTENDED_RELEASE_TABLET | Freq: Every day | ORAL | 3 refills | Status: DC

## 2023-05-11 NOTE — Patient Instructions (Signed)
 Medication Instructions:  Your physician recommends that you continue on your current medications as directed. Please refer to the Current Medication list given to you today.   *If you need a refill on your cardiac medications before your next appointment, please call your pharmacy*   Lab Work: No labs ordered today    Testing/Procedures: No test ordered today    Follow-Up: At [facility], you and your health needs are our priority.  As part of our continuing mission to provide you with exceptional heart care, we have created designated Provider Care Teams.  These Care Teams include your primary Cardiologist (physician) and Advanced Practice Providers (APPs -  Physician Assistants and Nurse Practitioners) who all work together to provide you with the care you need, when you need it.  We recommend signing up for the patient portal called MyChart.  Sign up information is provided on this After Visit Summary.  MyChart is used to connect with patients for Virtual Visits (Telemedicine).  Patients are able to view lab/test results, encounter notes, upcoming appointments, etc.  Non-urgent messages can be sent to your provider as well.   To learn more about what you can do with MyChart, go to [email or web address].    Your next appointment:   2 week(s)  Provider:   Suzann Riddle, NP

## 2023-05-11 NOTE — Patient Instructions (Addendum)
 Medication Instructions:  Your physician recommends the following medication changes.  START TAKING: Lasix  20 mg by mouth daily Potassium 10 meq by mouth daily  *If you need a refill on your cardiac medications before your next appointment, please call your pharmacy*   Lab Work: Your provider would like for you to have following labs drawn today (CMP, CBC, BNP, TSH, T4).       Testing/Procedures: Your physician has requested that you have an Limited echocardiogram. Echocardiography is a painless test that uses sound waves to create images of your heart. It provides your doctor with information about the size and shape of your heart and how well your heart's chambers and valves are working.   You may receive an ultrasound enhancing agent through an IV if needed to better visualize your heart during the echo. This procedure takes approximately one hour.  There are no restrictions for this procedure.  This will take place at [address] [facility]) [address], [place] [address]  Please note: We ask at that you not bring children with you during ultrasound (echo/ vascular) testing. Due to room size and safety concerns, children are not allowed in the ultrasound rooms during exams. Our front office staff cannot provide observation of children in our lobby area while testing is being conducted. An adult accompanying a patient to their appointment will only be allowed in the ultrasound room at the discretion of the ultrasound technician under special circumstances. We apologize for any inconvenience.    Follow-Up: At [facility], you and your health needs are our priority.  As part of our continuing mission to provide you with exceptional heart care, we have created designated Provider Care Teams.  These Care Teams include your primary Cardiologist (physician) and Advanced Practice Providers (APPs -  Physician Assistants and Nurse Practitioners) who all work together to  provide you with the care you need, when you need it.  We recommend signing up for the patient portal called MyChart.  Sign up information is provided on this After Visit Summary.  MyChart is used to connect with patients for Virtual Visits (Telemedicine).  Patients are able to view lab/test results, encounter notes, upcoming appointments, etc.  Non-urgent messages can be sent to your provider as well.   To learn more about what you can do with MyChart, go to [email or web address].    Your next appointment:   2 month(s)  Provider:   Mikey Fishman, PA-C

## 2023-05-11 NOTE — Progress Notes (Signed)
 Cardiology Office Note:  .   Date:  05/11/2023  ID:  Ples KANDICE Rattler, DOB 1926-10-27, MRN [id number] PCP: Fernande Ophelia JINNY DOUGLAS, MD  [facility] Providers Cardiologist:  Redell Cave, MD Electrophysiologist:  OLE ONEIDA HOLTS, MD {  History of Present Illness: Catherine   Tacori BERENICE Hodge is a 89 y.o. female with a h/o permanent Afib, SSS s/p PPM, HTN, stage 2 breast cancer who presents for follow-up.   The patient was last seen 02/2023 and was stable from a cardiac perspective.   Today, the patient reports lower leg edema 15 days ago. Nothing changes. No chest pain, no SOB. Patient does not eat a lot of salt. She is V pacing today, which is different from prior, may be in Afib. BP has been a little high at home. No recent fever or chills.   Studies Reviewed: Catherine   EKG Interpretation Date/Time:  Friday May 11 2023 10:45:56 EST Ventricular Rate:  71 PR Interval:    QRS Duration:  154 QT Interval:  436 QTC Calculation: 473 R Axis:   -58  Text Interpretation: Ventricular-paced rhythm When compared with ECG of 11-Oct-2022 12:10, Electronic ventricular pacemaker has replaced Junctional rhythm Confirmed by Franchester, Vicki Chaffin ([id number]) on 05/11/2023 10:49:39 AM    Echo 11/2022 1. Left ventricular ejection fraction, by estimation, is 55 to 60%. The  left ventricle has normal function. The left ventricle has no regional  wall motion abnormalities. There is severe asymmetric left ventricular  hypertrophy of the basal-septal  segment. Left ventricular diastolic parameters are indeterminate. The  average left ventricular global longitudinal strain is -15.2 %. The global  longitudinal strain is abnormal.   2. Right ventricular systolic function is mildly reduced. The right  ventricular size is normal. There is normal pulmonary artery systolic  pressure.   3. Left atrial size was severely dilated.   4. The mitral valve is normal in structure. Mild mitral valve  regurgitation. No evidence of  mitral stenosis. Moderate mitral annular  calcification.   5. Tricuspid valve regurgitation is moderate.   6. The aortic valve is tricuspid. There is mild thickening of the aortic  valve. Aortic valve regurgitation is mild to moderate.   7. There is borderline dilatation of the ascending aorta, measuring 38  mm.   8. The inferior vena cava is normal in size with greater than 50%  respiratory variability, suggesting right atrial pressure of 3 mmHg.    Physical Exam:   VS:  BP (!) 140/68 (BP Location: Left Arm, Patient Position: Sitting, Cuff Size: Normal)   Pulse 71   Ht 5' 2 (1.575 m)   Wt 116 lb 9.6 oz (52.9 kg)   SpO2 98%   BMI 21.33 kg/m    Wt Readings from Last 3 Encounters:  05/11/23 116 lb 9.6 oz (52.9 kg)  03/14/23 127 lb 13.9 oz (58 kg)  03/05/23 116 lb 12.8 oz (53 kg)    GEN: Well nourished, well developed in no acute distress NECK: No JVD; No carotid bruits CARDIAC: RRR, no murmurs, rubs, gallops RESPIRATORY:  Clear to auscultation without rales, wheezing or rhonchi  ABDOMEN: Soft, non-tender, non-distended EXTREMITIES:  1+ lower leg edema; No deformity   ASSESSMENT AND PLAN: .    Lower leg edema The patient reports new lower leg edema that started 15 days ago. She denies any changes. She has 1+ lower leg edema and minimal JVD. She denies chest pain or significant SOB. Decompensation may be from ?higher afib burden, she  is V pacing which is new for her. Device check today. I will repeat a limited echo. I will start lasix  20mg  daily and potassium 10meq daily. Patient is traveling next week. I will check CMET, BNP, CBC, TSH. She may not need lasix  every day. BMET in 2 weeks.   Permanent Afib SSS s/p PPM Most recent device check showed 1% afib burden. Device check today with EP. Continue Eliquis  2.5mg  BID.   HTN BP mildly elevated, may be from lower leg edema. Continue amlodipine  10mg  daily and Losartan  25mg  daily.    Dispo: Follow-up up in 2  months  Signed, Tinzley Dalia VEAR Fishman, PA-C

## 2023-05-12 LAB — CBC
Hematocrit: 39.3 % (ref 34.0–46.6)
Hemoglobin: 13 g/dL (ref 11.1–15.9)
MCH: 33.5 pg — ABNORMAL HIGH (ref 26.6–33.0)
MCHC: 33.1 g/dL (ref 31.5–35.7)
MCV: 101 fL — ABNORMAL HIGH (ref 79–97)
Platelets: 187 10*3/uL (ref 150–450)
RBC: 3.88 x10E6/uL (ref 3.77–5.28)
RDW: 12.6 % (ref 11.7–15.4)
WBC: 6.4 10*3/uL (ref 3.4–10.8)

## 2023-05-12 LAB — COMPREHENSIVE METABOLIC PANEL
ALT: 7 [IU]/L (ref 0–32)
AST: 17 [IU]/L (ref 0–40)
Albumin: 3.9 g/dL (ref 3.6–4.6)
Alkaline Phosphatase: 80 [IU]/L (ref 44–121)
BUN/Creatinine Ratio: 29 — ABNORMAL HIGH (ref 12–28)
BUN: 21 mg/dL (ref 10–36)
Bilirubin Total: 0.8 mg/dL (ref 0.0–1.2)
CO2: 21 mmol/L (ref 20–29)
Calcium: 9.3 mg/dL (ref 8.7–10.3)
Chloride: 105 mmol/L (ref 96–106)
Creatinine, Ser: 0.72 mg/dL (ref 0.57–1.00)
Globulin, Total: 2.6 g/dL (ref 1.5–4.5)
Glucose: 83 mg/dL (ref 70–99)
Potassium: 4.8 mmol/L (ref 3.5–5.2)
Sodium: 140 mmol/L (ref 134–144)
Total Protein: 6.5 g/dL (ref 6.0–8.5)
eGFR: 77 mL/min/{1.73_m2} (ref >59)

## 2023-05-12 LAB — TSH: TSH: 3.56 u[IU]/mL (ref 0.450–4.500)

## 2023-05-12 LAB — BRAIN NATRIURETIC PEPTIDE: BNP: 176.5 pg/mL — ABNORMAL HIGH (ref 0.0–100.0)

## 2023-05-12 LAB — T4: T4, Total: 4.6 ug/dL (ref 4.5–12.0)

## 2023-05-17 ENCOUNTER — Encounter: Payer: Self-pay | Admitting: Cardiology

## 2023-05-25 ENCOUNTER — Ambulatory Visit (INDEPENDENT_AMBULATORY_CARE_PROVIDER_SITE_OTHER): Payer: Medicare Other

## 2023-05-25 DIAGNOSIS — I495 Sick sinus syndrome: Secondary | ICD-10-CM | POA: Diagnosis not present

## 2023-05-29 ENCOUNTER — Ambulatory Visit: Payer: Medicare Other | Attending: Medical

## 2023-05-29 DIAGNOSIS — R600 Localized edema: Secondary | ICD-10-CM | POA: Insufficient documentation

## 2023-05-29 LAB — ECHOCARDIOGRAM LIMITED
AV Mean grad: 4 mmHg
AV Peak grad: 7 mmHg
Ao pk vel: 1.32 m/s
P 1/2 time: 514 ms
S' Lateral: 3.1 cm

## 2023-05-29 NOTE — Progress Notes (Unsigned)
 Electrophysiology Clinic Note    Date:  05/30/2023  Patient ID:  [name], [name] 1926/07/20, MRN [id number] PCP:  Lynnea Ferrier, MD  Cardiologist:  Debbe Odea, MD Electrophysiologist: Lanier Prude, MD   Discussed the use of AI scribe software for clinical note transcription with the patient, who gave verbal consent to proceed.   Patient Profile    Chief Complaint: AFib, PPM follow-up  History of Present Illness: [name] is a 89 y.o. female with PMH notable for persis AFib, SSS s/p PPM, HTN, hyperthyroid, CKD -3a; seen today for Lanier Prude, MD for routine electrophysiology followup.  She last saw Dr. Lalla Brothers 05/2022 to transfer PPM care to [facility] office.  PA Furth and I both saw patient about 2 weeks ago, where she c/o increased lower extremity edema, was in AFib during appt. Per device, AF since 04/2023.  PRN lasix started. She was traveling out of country after our appt, so close follow-up scheduled.  On follow-up today, her lower extremity edema is much improved. She did not tolerate the lasix well though, states it caused nausea, GI upset, and increased fatigue. She questions whether there are any newer medications she could take for her BP. She sporadically checks her BP at home, most readings 130-150 systolic.  She was previously on losartan but this was stopped for unclear reasons and her amlodipine was increased. Around this same time, she began having increased lower extremity edema and worsening fatigue. She now also remembers that she also started other medications around this same time.   She continues to dilgently take eliquis BID, no bleeding issues.   she denies chest pain, palpitations, dyspnea, PND, orthopnea, nausea, vomiting, syncope, weight gain, or early satiety.     Device Information: MDT dual chamber PPM, imp 09/2008; dx SSS, afib St. Jude RA and RV leads  AAD History: none    ROS:  Please see the history of present  illness. All other systems are reviewed and otherwise negative.    Physical Exam    VS:  BP (!) 151/87 (BP Location: Left Arm, Patient Position: Sitting, Cuff Size: Normal)   Pulse 88   Ht 5\' 2"  (1.575 m)   Wt 113 lb 6.4 oz (51.4 kg)   SpO2 98%   BMI 20.74 kg/m  BMI: Body mass index is 20.74 kg/m.     05/30/2023   10:28 AM 05/11/2023   10:39 AM 03/14/2023    8:00 PM  Vitals with BMI  Height 5\' 2"  5\' 2"    Weight 113 lbs 6 oz 116 lbs 10 oz   BMI 20.74 21.32   Systolic 151 140 409  Diastolic 87 68 75  Pulse 88 71 55     Wt Readings from Last 3 Encounters:  05/30/23 113 lb 6.4 oz (51.4 kg)  05/11/23 116 lb 9.6 oz (52.9 kg)  03/14/23 127 lb 13.9 oz (58 kg)     GEN- The patient is well appearing, alert and oriented x 3 today.   Lungs- Clear to ausculation bilaterally, normal work of breathing.  Heart- Regular rate and rhythm, no murmurs, rubs or gallops Extremities- No peripheral edema, warm, dry Skin-  device pocket well-healed, no tethering   Device interrogation done today and reviewed by myself:  Battery 6.5 years Lead thresholds, impedence, sensing stable  Presents in sinus No changes made today   Studies Reviewed   Previous EP, cardiology notes.    EKG is not ordered. Personal review  of EKG from  05/11/2023  shows: AFib with VP at 71        TTE, 11/08/2022  1. Left ventricular ejection fraction, by estimation, is 55 to 60%. The left ventricle has normal function. The left ventricle has no regional wall motion abnormalities. There is severe asymmetric left ventricular hypertrophy of the basal-septal  segment. Left ventricular diastolic parameters are indeterminate. The average left ventricular global longitudinal strain is -15.2 %. The global longitudinal strain is abnormal.   2. Right ventricular systolic function is mildly reduced. The right ventricular size is normal. There is normal pulmonary artery systolic pressure.   3. Left atrial size was severely  dilated.   4. The mitral valve is normal in structure. Mild mitral valve regurgitation. No evidence of mitral stenosis. Moderate mitral annular calcification.   5. Tricuspid valve regurgitation is moderate.   6. The aortic valve is tricuspid. There is mild thickening of the aortic valve. Aortic valve regurgitation is mild to moderate.   7. There is borderline dilatation of the ascending aorta, measuring 38 mm.   8. The inferior vena cava is normal in size with greater than 50% respiratory variability, suggesting right atrial pressure of 3 mmHg.     Assessment and Plan    #) HTN #) persis AFib BP elevated in office today and by home readings She believes her increased lower extremity swelling corresponds to increasing dose of amlodipine to 10mg . This is also about the time she developed persis AFib - reduce amlodipine to 5mg  daily in AM - Restart 12.5mg  losartan daily in PM Adjust lasix to PRN for increased swelling Take meds with food to see if that helps with her GI upset She is to notify office in 1 week whether she is tolerating this med regimen, home BP readings, and swelling.  If she is tolerating losartan, will need update BMP in 1-2 weeks If she does not tolerate losartan, will plan to switch to coreg  Plan discussed with patient and daughter, who are in agreement.   #) Hypercoag d/t persis afib CHA2DS2-VASc Score = at least 6 [CHF History: 1, HTN History: 1, Diabetes History: 0, Stroke History: 0, Vascular Disease History: 1, Age Score: 2, Gender Score: 1].  Therefore, the patient's annual risk of stroke is 9.7 %.    Stroke ppx - 2.5mg  eliquis BID, dose reduced for age, weight No bleeding concerns  #) SND s/p PPM Device functioning well, see pace art for details       Current medicines are reviewed at length with the patient today.   The patient has concerns regarding her medicines.  The following changes were made today:   Take lasix as needed for increase lower  extremity edema Reduce amlodipine to 5mg  daily Start 12.5mg  losartan   Labs/ tests ordered today include:  Orders Placed This Encounter  Procedures   Basic metabolic panel     Disposition: Follow up with Dr. Lalla Brothers or EP APP  in 3 months to monitor AFib burden and edema   Signed, Sherie Don, NP  05/30/23  12:26 PM  Electrophysiology [facility]

## 2023-05-30 ENCOUNTER — Ambulatory Visit: Payer: Medicare Other | Attending: Cardiology | Admitting: Cardiology

## 2023-05-30 VITALS — BP 151/87 | HR 88 | Ht 62.0 in | Wt 113.4 lb

## 2023-05-30 DIAGNOSIS — I495 Sick sinus syndrome: Secondary | ICD-10-CM | POA: Diagnosis not present

## 2023-05-30 DIAGNOSIS — Z950 Presence of cardiac pacemaker: Secondary | ICD-10-CM | POA: Diagnosis present

## 2023-05-30 DIAGNOSIS — I4819 Other persistent atrial fibrillation: Secondary | ICD-10-CM | POA: Diagnosis present

## 2023-05-30 DIAGNOSIS — D6869 Other thrombophilia: Secondary | ICD-10-CM | POA: Insufficient documentation

## 2023-05-30 LAB — CUP PACEART REMOTE DEVICE CHECK
Battery Remaining Longevity: 77 mo
Battery Voltage: 2.97 V
Brady Statistic RA Percent Paced: 1.49 %
Brady Statistic RV Percent Paced: 71.32 %
Date Time Interrogation Session: 20250224111736
Implantable Lead Connection Status: 753985
Implantable Lead Connection Status: 753985
Implantable Lead Implant Date: 20100625
Implantable Lead Implant Date: 20100625
Implantable Lead Location: 753859
Implantable Lead Location: 753862
Implantable Pulse Generator Implant Date: 20200129
Lead Channel Impedance Value: 247 Ohm
Lead Channel Impedance Value: 342 Ohm
Lead Channel Impedance Value: 361 Ohm
Lead Channel Impedance Value: 494 Ohm
Lead Channel Pacing Threshold Amplitude: 0.75 V
Lead Channel Pacing Threshold Amplitude: 2.125 V
Lead Channel Pacing Threshold Pulse Width: 0.4 ms
Lead Channel Pacing Threshold Pulse Width: 0.4 ms
Lead Channel Sensing Intrinsic Amplitude: 0.375 mV
Lead Channel Sensing Intrinsic Amplitude: 0.375 mV
Lead Channel Sensing Intrinsic Amplitude: 11.5 mV
Lead Channel Sensing Intrinsic Amplitude: 11.5 mV
Lead Channel Setting Pacing Amplitude: 1.5 V
Lead Channel Setting Pacing Amplitude: 3.25 V
Lead Channel Setting Pacing Pulse Width: 0.4 ms
Lead Channel Setting Sensing Sensitivity: 0.9 mV
Zone Setting Status: 755011
Zone Setting Status: 755011

## 2023-05-30 MED ORDER — FUROSEMIDE 20 MG PO TABS: 20.0000 mg | ORAL_TABLET | Freq: Every day | ORAL | 3 refills | Status: DC | PRN

## 2023-05-30 MED ORDER — LOSARTAN POTASSIUM 25 MG PO TABS: 12.5000 mg | ORAL_TABLET | Freq: Every day | ORAL | 3 refills | Status: DC

## 2023-05-30 MED ORDER — AMLODIPINE BESYLATE 5 MG PO TABS: 5.0000 mg | ORAL_TABLET | Freq: Every day | ORAL | 3 refills | Status: DC

## 2023-05-30 NOTE — Patient Instructions (Signed)
 Medication Instructions:  CHANGE Lasix to as needed   DECREASE Amlodipine to 5 mg daily   RESTART Losartan 12.5 mg in the evening (take with food for 1 week, let us know how you are feeling after starting this)    *If you need a refill on your cardiac medications before your next appointment, please call your pharmacy*   Lab Work: Your provider would like for you to return in 1-2 weeks to have the following labs drawn: BMET.   Please go to [facility] [address] [address], [place] [address] You do not need an appointment.  They are open from 8 am- 4:30 pm.  Lunch from 1:00 pm- 2:00 pm You do not need to be fasting.   If you have labs (blood work) drawn today and your tests are completely normal, you will receive your results only by: MyChart Message (if you have MyChart) OR A paper copy in the mail If you have any lab test that is abnormal or we need to change your treatment, we will call you to review the results.   Follow-Up: At [facility], you and your health needs are our priority.  As part of our continuing mission to provide you with exceptional heart care, we have created designated Provider Care Teams.  These Care Teams include your primary Cardiologist (physician) and Advanced Practice Providers (APPs -  Physician Assistants and Nurse Practitioners) who all work together to provide you with the care you need, when you need it.  We recommend signing up for the patient portal called "MyChart".  Sign up information is provided on this After Visit Summary.  MyChart is used to connect with patients for Virtual Visits (Telemedicine).  Patients are able to view lab/test results, encounter notes, upcoming appointments, etc.  Non-urgent messages can be sent to your provider as well.   To learn more about what you can do with MyChart, go to [email or web address].    Your next appointment:   3 month(s)  Provider:   Sherie Don, NP     Other Instructions Please contact our office with your blood pressures and if any swelling.

## 2023-05-31 ENCOUNTER — Telehealth: Payer: Self-pay

## 2023-05-31 ENCOUNTER — Other Ambulatory Visit: Payer: Self-pay

## 2023-05-31 NOTE — Telephone Encounter (Signed)
 This patients pacemaker is NOT MRI compatible d/t different system/leads. Routing to advise.

## 2023-05-31 NOTE — Telephone Encounter (Signed)
 I will forward this to the device clinic to ensure the patient's device is MRI-compatible.   Cadence David Stall, PA-C 05/31/2023 12:16 PM EST     Echo showed normal pump function , severe LVH, mildly elevated pulmonary artery pressure, moderate mitral valve disease. Lets check a cardiac MRI for LVH

## 2023-06-04 NOTE — Telephone Encounter (Signed)
 Furth, Cadence H, PA-C  You5 minutes ago (12:44 PM)    Ok, we will wait on the cMRI and continue with current follow-up to further discuss medications.

## 2023-06-05 ENCOUNTER — Encounter: Payer: Self-pay | Admitting: Cardiology

## 2023-06-26 NOTE — Progress Notes (Signed)
 Remote pacemaker transmission.

## 2023-06-26 NOTE — Addendum Note (Signed)
 Addended by: Elease Etienne A on: 06/26/2023 11:41 AM   Modules accepted: Orders

## 2023-07-09 ENCOUNTER — Encounter: Payer: Self-pay | Admitting: Medical

## 2023-07-09 ENCOUNTER — Ambulatory Visit: Payer: Medicare Other | Attending: Medical | Admitting: Medical

## 2023-07-09 VITALS — BP 148/72 | HR 82 | Resp 21 | Ht 62.0 in | Wt 109.5 lb

## 2023-07-09 DIAGNOSIS — I5032 Chronic diastolic (congestive) heart failure: Secondary | ICD-10-CM | POA: Diagnosis not present

## 2023-07-09 DIAGNOSIS — R42 Dizziness and giddiness: Secondary | ICD-10-CM | POA: Diagnosis present

## 2023-07-09 DIAGNOSIS — I4819 Other persistent atrial fibrillation: Secondary | ICD-10-CM | POA: Diagnosis not present

## 2023-07-09 DIAGNOSIS — I10 Essential (primary) hypertension: Secondary | ICD-10-CM | POA: Insufficient documentation

## 2023-07-09 MED ORDER — LOSARTAN POTASSIUM 25 MG PO TABS: 25.0000 mg | ORAL_TABLET | Freq: Every day | ORAL | 3 refills | Status: DC

## 2023-07-09 NOTE — Progress Notes (Signed)
 Cardiology Office Note:  .   Date:  07/09/2023  ID:  [name], DOB 1926-07-09, MRN [id number] PCP: Catherine Ferrier, MD  [facility] Providers Cardiologist:  Catherine Odea, MD Electrophysiologist:  Catherine Prude, MD     History of Present Illness: [name]   [name] is a 89 y.o. female with a h/o persistent Afib, SSS s/p PPM, HTN, stage 2 breast cancer who presents for follow-up for Afib.   The patient was seen 05/11/23 and had lower leg edema.she was V pacing, so suspected she was in Afib. Labs were drawn and she was given lasix. She was EP later that day and device check showed persistent Afib.   Device interrogation 2/24 showed >99% AF. Limited echo 2/25 showed LVEF 50-55%, WMA, severe LVH, mod MR, mod TR.  Today, she reports vision has been awful and has been having balance issues. She has been in and out of Afib. When she is in Afib she feels shaky and SOB. BP has been a little elevated. She denies chest pain. She has occasional swelling and takes lasix as needed.   Studies Reviewed: [name]   EKG Interpretation Date/Time:  Monday July 09 2023 11:06:53 EDT Ventricular Rate:  78 PR Interval:    QRS Duration:  152 QT Interval:  442 QTC Calculation: 503 R Axis:   -56  Text Interpretation: Ventricular-paced rhythm When compared with ECG of 11-May-2023 10:45, Vent. rate has increased BY   7 BPM Confirmed by [name], [name] ([id number]) on 07/09/2023 11:22:33 AM    Echo 11/2022 1. Left ventricular ejection fraction, by estimation, is 55 to 60%. The  left ventricle has normal function. The left ventricle has no regional  wall motion abnormalities. There is severe asymmetric left ventricular  hypertrophy of the basal-septal  segment. Left ventricular diastolic parameters are indeterminate. The  average left ventricular global longitudinal strain is -15.2 %. The global  longitudinal strain is abnormal.   2. Right ventricular systolic function is mildly reduced. The right   ventricular size is normal. There is normal pulmonary artery systolic  pressure.   3. Left atrial size was severely dilated.   4. The mitral valve is normal in structure. Mild mitral valve  regurgitation. No evidence of mitral stenosis. Moderate mitral annular  calcification.   5. Tricuspid valve regurgitation is moderate.   6. The aortic valve is tricuspid. There is mild thickening of the aortic  valve. Aortic valve regurgitation is mild to moderate.   7. There is borderline dilatation of the ascending aorta, measuring 38  mm.   8. The inferior vena cava is normal in size with greater than 50%  respiratory variability, suggesting right atrial pressure of 3 mmHg.      Physical Exam:   VS:  BP (!) 148/72   Pulse 82   Resp (!) 21   Ht 5\' 2"  (1.575 m)   Wt 109 lb 8 oz (49.7 kg)   SpO2 94%   BMI 20.03 kg/m    Wt Readings from Last 3 Encounters:  07/09/23 109 lb 8 oz (49.7 kg)  05/30/23 113 lb 6.4 oz (51.4 kg)  05/11/23 116 lb 9.6 oz (52.9 kg)    GEN: Well nourished, well developed in no acute distress NECK: No JVD; No carotid bruits CARDIAC: RRR, no murmurs, rubs, gallops RESPIRATORY:  Clear to auscultation without rales, wheezing or rhonchi  ABDOMEN: Soft, non-tender, non-distended EXTREMITIES:  No edema; No deformity   ASSESSMENT AND PLAN: .  HFpEF Recent limited echo showed LVEF 50-55%, HK of the mid to distal anteroseptal and apical region likely due to BBB, severe LVH with no outflow tract gradient, normal RVSF, ,mod MR, mild aortic valve cacification. The patient is euvolemic on exam. Pacemaker is not MRI compatible. Continue lasix 20mg  PRN swelling and Losartan.   Persistent Afib Most recent device interrogation showed >99% Afib. She follows with EP. Says she is in and out of aFib with mild symptoms. EKG shows V pacing today. Continue Eliquis 2.5mg  BID for stroke ppx.   HTN BP is elevated 165/84.  She is taking  Losartan 12.5mg  daily at night. We will increase to  25mg  daily. She will monitor dizziness at home. Continue amlodipine 5mg  in the AM  DIzziness She reports chronic dizziness and lightheadedness. I will check carotid US.       Dispo: Follow-up in 6 months  Signed, Catherine Boening David Stall, PA-C

## 2023-07-09 NOTE — Patient Instructions (Signed)
 Medication Instructions:  Your physician recommends the following medication changes.  INCREASE: Losartan to 25 mg by mouth daily   *If you need a refill on your cardiac medications before your next appointment, please call your pharmacy*  Lab Work: No labs ordered today   Testing/Procedures: Your physician has requested that you have a carotid duplex. This test is an ultrasound of the carotid arteries in your neck. It looks at blood flow through these arteries that supply the brain with blood.   Allow one hour for this exam.  There are no restrictions or special instructions.  This will take place at [address] [facility] [address], [place] [address]  Please note: We ask at that you not bring children with you during ultrasound (echo/ vascular) testing. Due to room size and safety concerns, children are not allowed in the ultrasound rooms during exams. Our front office staff cannot provide observation of children in our lobby area while testing is being conducted. An adult accompanying a patient to their appointment will only be allowed in the ultrasound room at the discretion of the ultrasound technician under special circumstances. We apologize for any inconvenience.    Follow-Up: At [facility], you and your health needs are our priority.  As part of our continuing mission to provide you with exceptional heart care, our providers are all part of one team.  This team includes your primary Cardiologist (physician) and Advanced Practice Providers or APPs (Physician Assistants and Nurse Practitioners) who all work together to provide you with the care you need, when you need it.  Your next appointment:   6 month(s)  Provider:   You may see Debbe Odea, MD or one of the following Advanced Practice Providers on your designated Care Team:   Nicolasa Ducking, NP Ames Dura, PA-C Eula Listen, PA-C Cadence Cool, PA-C Charlsie Quest, NP Carlos Levering,  NP    We recommend signing up for the patient portal called "MyChart".  Sign up information is provided on this After Visit Summary.  MyChart is used to connect with patients for Virtual Visits (Telemedicine).  Patients are able to view lab/test results, encounter notes, upcoming appointments, etc.  Non-urgent messages can be sent to your provider as well.   To learn more about what you can do with MyChart, go to [email or web address].

## 2023-07-26 ENCOUNTER — Telehealth: Payer: Self-pay | Admitting: Adult Health

## 2023-07-26 NOTE — Telephone Encounter (Signed)
 Patient scheduled appointments. Patient is aware of all appointment details.

## 2023-08-06 ENCOUNTER — Telehealth: Payer: Self-pay | Admitting: Adult Health

## 2023-08-06 ENCOUNTER — Inpatient Hospital Stay: Payer: Medicare Other | Admitting: Hematology and Oncology

## 2023-08-06 ENCOUNTER — Other Ambulatory Visit: Payer: Self-pay | Admitting: Cardiology

## 2023-08-06 NOTE — Telephone Encounter (Signed)
 Pt last saw Cadence Furth, [place] on 07/09/23, last labs 06/07/23 Creat 0.8, age 89, weight 49.7kg, based on specified criteria pt is on appropriate dosage of Eliquis  2.5mg  BID for afib.  Will refill rx.

## 2023-08-06 NOTE — Telephone Encounter (Signed)
 [name] scheduled her appointment and is acceptable to all appointment details provided.

## 2023-08-16 ENCOUNTER — Encounter: Payer: Self-pay | Admitting: Medical

## 2023-08-17 ENCOUNTER — Ambulatory Visit: Attending: Medical

## 2023-08-17 DIAGNOSIS — R42 Dizziness and giddiness: Secondary | ICD-10-CM | POA: Diagnosis present

## 2023-08-20 ENCOUNTER — Ambulatory Visit: Payer: Self-pay | Admitting: Medical

## 2023-08-20 ENCOUNTER — Other Ambulatory Visit: Payer: Self-pay | Admitting: Hematology and Oncology

## 2023-08-24 ENCOUNTER — Ambulatory Visit (INDEPENDENT_AMBULATORY_CARE_PROVIDER_SITE_OTHER): Payer: Medicare Other

## 2023-08-24 ENCOUNTER — Ambulatory Visit: Payer: Medicare Other | Admitting: Cardiology

## 2023-08-24 DIAGNOSIS — I495 Sick sinus syndrome: Secondary | ICD-10-CM

## 2023-08-28 LAB — CUP PACEART REMOTE DEVICE CHECK
Battery Remaining Longevity: 79 mo
Battery Voltage: 2.97 V
Brady Statistic AP VP Percent: 53.64 %
Brady Statistic AP VS Percent: 9.88 %
Brady Statistic AS VP Percent: 24 %
Brady Statistic AS VS Percent: 12.48 %
Brady Statistic RA Percent Paced: 63.44 %
Brady Statistic RV Percent Paced: 77.27 %
Date Time Interrogation Session: 20250523190310
Implantable Lead Connection Status: 753985
Implantable Lead Connection Status: 753985
Implantable Lead Implant Date: 20100625
Implantable Lead Implant Date: 20100625
Implantable Lead Location: 753859
Implantable Lead Location: 753862
Implantable Pulse Generator Implant Date: 20200129
Lead Channel Impedance Value: 228 Ohm
Lead Channel Impedance Value: 342 Ohm
Lead Channel Impedance Value: 380 Ohm
Lead Channel Impedance Value: 532 Ohm
Lead Channel Pacing Threshold Amplitude: 0.75 V
Lead Channel Pacing Threshold Amplitude: 1.875 V
Lead Channel Pacing Threshold Pulse Width: 0.4 ms
Lead Channel Pacing Threshold Pulse Width: 0.4 ms
Lead Channel Sensing Intrinsic Amplitude: 0.25 mV
Lead Channel Sensing Intrinsic Amplitude: 0.25 mV
Lead Channel Sensing Intrinsic Amplitude: 17.75 mV
Lead Channel Sensing Intrinsic Amplitude: 17.75 mV
Lead Channel Setting Pacing Amplitude: 1.5 V
Lead Channel Setting Pacing Amplitude: 2.75 V
Lead Channel Setting Pacing Pulse Width: 0.4 ms
Lead Channel Setting Sensing Sensitivity: 0.9 mV
Zone Setting Status: 755011
Zone Setting Status: 755011

## 2023-08-29 ENCOUNTER — Ambulatory Visit: Payer: Self-pay | Admitting: Cardiology

## 2023-08-30 ENCOUNTER — Ambulatory Visit: Admitting: Cardiology

## 2023-09-23 NOTE — Progress Notes (Unsigned)
 Electrophysiology Clinic Note    Date:  09/24/2023  Patient ID:  [name], [name] [birth year], MRN [id number] PCP:  Fernande Ophelia JINNY DOUGLAS, MD  Cardiologist:  Redell Cave, MD Electrophysiologist: OLE ONEIDA HOLTS, MD   Discussed the use of AI scribe software for clinical note transcription with the patient, who gave verbal consent to proceed.   Patient Profile    Chief Complaint: AFib, PPM follow-up  History of Present Illness: [name] is a 89 y.o. female with PMH notable for persis AFib, SSS s/p PPM, HTN, hyperthyroid, CKD -3a; seen today for OLE ONEIDA HOLTS, MD for routine electrophysiology followup.  She last saw Dr. HOLTS 05/2022 to transfer PPM care to [facility] office.  PA Furth and I both saw patient about 2 weeks later, where she c/o increased lower extremity edema, was in AFib during appt. Per device, AF since 04/2023. Lasix  was started, but patient did not tolerate the lasix  well.  I saw her in close follow-up 05/2023 where she was in sinus rhythm with improvement in lower extremity edema. Her edema thought d/t increased CCB dose, so BP meds adjusted.   She saw PA Franchester 07/2023 where she felt awful, having balance issues, in and out of AFib. BP elevated, so losartan  increased.   On follow-up today, she feels well. She is currently on prednisone therapy for her arthritis and has noticed she feels much better than she did prior to being on prednisone. She is not aware of any recent AF episodes. She continues to have intermittent lower extremity edema. Her daughter has noticed the edema tends to worsen when she is sitting with legs in a dependent position. No edema today.   BP readings have been variable as of late. Sometimes readings are in the 170-190 systolic in the mornings, but 869-859d in the evenings.  She continues to take eliquis  BID, no bleeding concerns.    she denies chest pain, palpitations, dyspnea, PND, orthopnea, nausea, vomiting, syncope, weight  gain, or early satiety.     Device Information: MDT dual chamber PPM, imp 09/2008; dx SSS, afib [employer] RA and RV leads  AAD History: none    ROS:  Please see the history of present illness. All other systems are reviewed and otherwise negative.    Physical Exam    VS:  BP 130/82 (BP Location: Left Arm, Patient Position: Sitting, Cuff Size: Normal)   Pulse 62   Ht 5' 2 (1.575 m)   Wt 106 lb 12.8 oz (48.4 kg)   SpO2 96%   BMI 19.53 kg/m  BMI: Body mass index is 19.53 kg/m.     Wt Readings from Last 3 Encounters:  09/24/23 106 lb 12.8 oz (48.4 kg)  07/09/23 109 lb 8 oz (49.7 kg)  05/30/23 113 lb 6.4 oz (51.4 kg)     GEN- The patient is well appearing, alert and oriented x 3 today.   Lungs- Clear to ausculation bilaterally, normal work of breathing.  Heart- Regular rate and rhythm, no murmurs, rubs or gallops Extremities- No peripheral edema, warm, dry Skin-  device pocket well-healed, no tethering   Device interrogation done today and reviewed by myself:  Battery 6.8 years Lead thresholds, impedence, sensing stable  Reduced p-wave sensing to 0.15, intrinsic p-wave 0.4 Frequent, brief AFib episodes Presents in sinus No changes made today   Studies Reviewed   Previous EP, cardiology notes.    EKG is not ordered. Personal review of EKG from 05/11/2023 shows:  EKG Interpretation Date/Time:  Monday September 24 2023 11:05:59 EDT Ventricular Rate:  62 PR Interval:    QRS Duration:  98 QT Interval:  384 QTC Calculation: 389 R Axis:   -42  Text Interpretation: ATRIAL PACED RHYTHM with prolonged AV conduction Confirmed by Adil Tugwell [phone]) on 09/24/2023 11:58:44 AM    TTE, 11/08/2022  1. Left ventricular ejection fraction, by estimation, is 55 to 60%. The left ventricle has normal function. The left ventricle has no regional wall motion abnormalities. There is severe asymmetric left ventricular hypertrophy of the basal-septal segment. Left ventricular diastolic  parameters are indeterminate. The average left ventricular global longitudinal strain is -15.2 %. The global longitudinal strain is abnormal.   2. Right ventricular systolic function is mildly reduced. The right ventricular size is normal. There is normal pulmonary artery systolic pressure.   3. Left atrial size was severely dilated.   4. The mitral valve is normal in structure. Mild mitral valve regurgitation. No evidence of mitral stenosis. Moderate mitral annular calcification.   5. Tricuspid valve regurgitation is moderate.   6. The aortic valve is tricuspid. There is mild thickening of the aortic valve. Aortic valve regurgitation is mild to moderate.   7. There is borderline dilatation of the ascending aorta, measuring 38 mm.   8. The inferior vena cava is normal in size with greater than 50% respiratory variability, suggesting right atrial pressure of 3 mmHg.     Assessment and Plan    #) HTN #) persis AFib Continues to have paroxysmal AF episodes, asymptomatic  Lower extremity edema has significantly improved She continues to be on prednisone, which appears to have increased her BP She does not think she'll be on prednisone in the future Continue 5mg  amlodipine , 25mg  losartan  at this time Continue lasix  PRN for increased swelling Consider AAD  #) Hypercoag d/t persis afib CHA2DS2-VASc Score = at least 6 [CHF History: 1, HTN History: 1, Diabetes History: 0, Stroke History: 0, Vascular Disease History: 1, Age Score: 2, Gender Score: 1].  Therefore, the patient's annual risk of stroke is 9.7 %.    Stroke ppx - 2.5mg  eliquis  BID, dose reduced for age, weight No bleeding concerns  #) SND s/p PPM Device functioning well, see pace art for details       Current medicines are reviewed at length with the patient today.   The patient has concerns regarding her medicines.  The following changes were made today:   none   Labs/ tests ordered today include:  Orders Placed This  Encounter  Procedures   EKG 12-Lead     Disposition: Follow up with Dr. Cindie or EP APP  in 6 months     Signed, Deonna Krummel, NP  09/24/23  12:40 PM  Electrophysiology [facility]

## 2023-09-24 ENCOUNTER — Ambulatory Visit: Attending: Cardiology | Admitting: Cardiology

## 2023-09-24 ENCOUNTER — Ambulatory Visit: Payer: Self-pay | Admitting: Cardiology

## 2023-09-24 VITALS — BP 130/82 | HR 62 | Ht 62.0 in | Wt 106.8 lb

## 2023-09-24 DIAGNOSIS — I4819 Other persistent atrial fibrillation: Secondary | ICD-10-CM | POA: Insufficient documentation

## 2023-09-24 DIAGNOSIS — I495 Sick sinus syndrome: Secondary | ICD-10-CM | POA: Diagnosis present

## 2023-09-24 DIAGNOSIS — Z950 Presence of cardiac pacemaker: Secondary | ICD-10-CM | POA: Diagnosis not present

## 2023-09-24 DIAGNOSIS — D6869 Other thrombophilia: Secondary | ICD-10-CM | POA: Insufficient documentation

## 2023-09-24 LAB — CUP PACEART INCLINIC DEVICE CHECK
Date Time Interrogation Session: 20250623124542
Implantable Lead Connection Status: 753985
Implantable Lead Connection Status: 753985
Implantable Lead Implant Date: 20100625
Implantable Lead Implant Date: 20100625
Implantable Lead Location: 753859
Implantable Lead Location: 753862
Implantable Pulse Generator Implant Date: 20200129

## 2023-10-02 NOTE — Addendum Note (Signed)
 Addended by: VICCI SELLER A on: 10/02/2023 11:05 AM   Modules accepted: Orders

## 2023-10-02 NOTE — Progress Notes (Signed)
 Remote pacemaker transmission.

## 2023-10-12 ENCOUNTER — Encounter: Admitting: Adult Health

## 2023-10-25 ENCOUNTER — Encounter: Admitting: Adult Health

## 2023-10-30 ENCOUNTER — Telehealth: Payer: Self-pay | Admitting: *Deleted

## 2023-10-30 NOTE — Telephone Encounter (Signed)
 Received message from [name] that patient needed to change her appt with Dr. Loretha because she will be out of town.  New aptt scheduled for 8/26 @1 :15pm. [facility] will give patient appt info.

## 2023-11-02 ENCOUNTER — Ambulatory Visit: Admitting: Hematology and Oncology

## 2023-11-05 ENCOUNTER — Inpatient Hospital Stay: Admitting: Hematology and Oncology

## 2023-11-19 ENCOUNTER — Telehealth: Payer: Self-pay | Admitting: Cardiology

## 2023-11-19 MED ORDER — LOSARTAN POTASSIUM 25 MG PO TABS: 25.0000 mg | ORAL_TABLET | Freq: Every day | ORAL | 3 refills | Status: AC

## 2023-11-19 NOTE — Telephone Encounter (Signed)
 Notified EC that prescription has been sent to her pharmacy. She verbalized understanding with no further questions at this time.

## 2023-11-19 NOTE — Telephone Encounter (Signed)
  Pt c/o medication issue:  1. Name of Medication: losartan  (COZAAR ) 25 MG tablet   2. How are you currently taking this medication (dosage and times per day)?    Take 1 tablet (25 mg total) by mouth daily.    3. Are you having a reaction (difficulty breathing--STAT)? na  4. What is your medication issue? Daughter is calling saying that Suzann Riddle upped her mother's prescription from 1/2 pill daily to full 25 mg daily. She needs a new prescription stating that sent to pharmacy. She needs a refill now.

## 2023-11-23 ENCOUNTER — Ambulatory Visit (INDEPENDENT_AMBULATORY_CARE_PROVIDER_SITE_OTHER): Payer: Medicare Other

## 2023-11-23 DIAGNOSIS — I495 Sick sinus syndrome: Secondary | ICD-10-CM

## 2023-11-23 LAB — CUP PACEART REMOTE DEVICE CHECK
Battery Remaining Longevity: 68 mo
Battery Voltage: 2.96 V
Brady Statistic AP VP Percent: 42.07 %
Brady Statistic AP VS Percent: 6.16 %
Brady Statistic AS VP Percent: 40.53 %
Brady Statistic AS VS Percent: 11.21 %
Brady Statistic RA Percent Paced: 37.96 %
Brady Statistic RV Percent Paced: 79.14 %
Date Time Interrogation Session: 20250821195604
Implantable Lead Connection Status: 753985
Implantable Lead Connection Status: 753985
Implantable Lead Implant Date: 20100625
Implantable Lead Implant Date: 20100625
Implantable Lead Location: 753859
Implantable Lead Location: 753862
Implantable Pulse Generator Implant Date: 20200129
Lead Channel Impedance Value: 247 Ohm
Lead Channel Impedance Value: 323 Ohm
Lead Channel Impedance Value: 342 Ohm
Lead Channel Impedance Value: 475 Ohm
Lead Channel Pacing Threshold Amplitude: 0.875 V
Lead Channel Pacing Threshold Amplitude: 1.75 V
Lead Channel Pacing Threshold Pulse Width: 0.4 ms
Lead Channel Pacing Threshold Pulse Width: 0.4 ms
Lead Channel Sensing Intrinsic Amplitude: 0.25 mV
Lead Channel Sensing Intrinsic Amplitude: 0.25 mV
Lead Channel Sensing Intrinsic Amplitude: 15.75 mV
Lead Channel Sensing Intrinsic Amplitude: 15.75 mV
Lead Channel Setting Pacing Amplitude: 1.75 V
Lead Channel Setting Pacing Amplitude: 2.75 V
Lead Channel Setting Pacing Pulse Width: 0.4 ms
Lead Channel Setting Sensing Sensitivity: 0.9 mV
Zone Setting Status: 755011
Zone Setting Status: 755011

## 2023-11-26 ENCOUNTER — Ambulatory Visit: Payer: Self-pay | Admitting: Cardiology

## 2023-11-26 ENCOUNTER — Telehealth: Payer: Self-pay

## 2023-11-26 NOTE — Telephone Encounter (Signed)
Spoke with patient and confirmed appointment for 8/26

## 2023-11-27 ENCOUNTER — Inpatient Hospital Stay: Attending: Hematology and Oncology | Admitting: Hematology and Oncology

## 2023-11-27 ENCOUNTER — Encounter: Payer: Self-pay | Admitting: Hematology and Oncology

## 2023-11-27 VITALS — BP 160/87 | HR 88 | Temp 98.7°F | Resp 16 | Wt 108.3 lb

## 2023-11-27 DIAGNOSIS — C50411 Malignant neoplasm of upper-outer quadrant of right female breast: Secondary | ICD-10-CM | POA: Diagnosis not present

## 2023-11-27 DIAGNOSIS — R61 Generalized hyperhidrosis: Secondary | ICD-10-CM | POA: Diagnosis not present

## 2023-11-27 DIAGNOSIS — Z79811 Long term (current) use of aromatase inhibitors: Secondary | ICD-10-CM | POA: Insufficient documentation

## 2023-11-27 DIAGNOSIS — Z79899 Other long term (current) drug therapy: Secondary | ICD-10-CM | POA: Diagnosis not present

## 2023-11-27 DIAGNOSIS — R110 Nausea: Secondary | ICD-10-CM | POA: Insufficient documentation

## 2023-11-27 DIAGNOSIS — Z1732 Human epidermal growth factor receptor 2 negative status: Secondary | ICD-10-CM | POA: Insufficient documentation

## 2023-11-27 DIAGNOSIS — R42 Dizziness and giddiness: Secondary | ICD-10-CM | POA: Diagnosis not present

## 2023-11-27 DIAGNOSIS — K5900 Constipation, unspecified: Secondary | ICD-10-CM | POA: Diagnosis not present

## 2023-11-27 DIAGNOSIS — Z170 Estrogen receptor positive status [ER+]: Secondary | ICD-10-CM | POA: Insufficient documentation

## 2023-11-27 DIAGNOSIS — Z1721 Progesterone receptor positive status: Secondary | ICD-10-CM | POA: Diagnosis not present

## 2023-11-27 NOTE — Progress Notes (Signed)
 [facility] CONSULT NOTE  Patient Care Team: Fernande Ophelia JINNY DOUGLAS, MD as PCP - General Cindie Ole DASEN, MD as PCP - Electrophysiology (Cardiology) Darliss Rogue, MD as PCP - Cardiology (Cardiology) Tyree Nanetta SAILOR, RN as Oncology Nurse Navigator Glean, Stephane BROCKS, RN (Inactive) as Oncology Nurse Navigator Aron Shoulders, MD as Consulting Physician (General Surgery) Loretha Ash, MD as Consulting Physician (Hematology and Oncology) Dewey Rush, MD as Consulting Physician (Radiation Oncology)  CHIEF COMPLAINTS/PURPOSE OF CONSULTATION:  Newly diagnosed breast cancer  HISTORY OF PRESENTING ILLNESS:  Ples KANDICE Rattler 89 y.o. female is here because of recent diagnosis of right breast cancer  I reviewed her records extensively and collaborated the history with the patient.  SUMMARY OF ONCOLOGIC HISTORY: Oncology History  Malignant neoplasm of upper-outer quadrant of right breast in female, estrogen receptor positive (HCC)  11/01/2022 Mammogram   Screening mammogram with no indeterminate asymmetry right breast. Additional views with possible ultrasound are recommended.  Ultrasound showed 6 x 8 mm irregular mass in the right breast which was thought to be indeterminate, biopsy recommended   11/15/2022 Pathology Results   Right breast needle core biopsy at 12:00 showed grade 2 invasive mammary carcinoma, prognostic showed ER 100% positive strong staining, PR 60% positive moderate to strong staining intensity group 5 HER2 negative and Ki-67 of 15%   11/27/2022 Initial Diagnosis   Malignant neoplasm of upper-outer quadrant of right breast in female, estrogen receptor positive (HCC)   11/29/2022 Cancer Staging   Staging form: Breast, AJCC 8th Edition - Clinical stage from 11/29/2022: Stage IA (cT1b, cN0, cM0, G2, ER+, PR+, HER2-) - Signed by Lanell Donald Stagger, PA-C on 11/29/2022 Stage prefix: Initial diagnosis Method of lymph node assessment: Clinical Histologic grading  system: 3 grade system   12/12/2022 Genetic Testing   Negative [facility] CancerNext +RNAinsight Panel.  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 Report date is 12/12/2022.   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  Amended report date is 07/27/2023.   The [facility] CancerNext+RNAinsight Panel includes sequencing, rearrangement analysis, and RNA analysis for the following 34 genes: APC, ATM, BARD1, BMPR1A, BRCA1, BRCA2, BRIP1, CDH1, CDK4, CDKN2A, CHEK2, DICER1, MLH1, MSH2, MSH6, MUTYH, NF1, NTHL1, PALB2, PMS2, PTEN, RAD51C, RAD51D, SMAD4, SMARCA4, STK11 and TP53 (sequencing and deletion/duplication); AXIN2, HOXB13, MSH3, POLD1 and POLE (sequencing only); EPCAM and GREM1 (deletion/duplication only).    Discussed the use of AI scribe software for clinical note transcription with the patient, who gave verbal consent to proceed.  History of Present Illness Lesta KEAISHA SUBLETTE is a 89 year old female with breast cancer who presents for follow-up of her treatment with letrozole .  She is adherent to her letrozole  regimen without missing any doses. She experiences night sweats and nausea, which she attributes to the timing of her medication intake. She currently takes letrozole  at night and is considering changing the timing to morning. She also has sluggish bowels and uses Metamucil to manage this, along with ensuring adequate water intake.  She has a history of dizziness, described as vertigo, and notes that her vision has been affected. She has been receiving infusions for thyroid  eye disease. Despite these issues, she adapts by using large print books and Audible on her iPad to continue reading. Her daughters are supportive, providing her with resources to help with her vision challenges. She uses a walker to manage her dizziness and prevent falls.  Her recent mammogram showed a decrease in the size of the tumor in  her right breast, now measuring 4 millimeters, down from 0.6 by 0.7 by 1  centimeter.  MEDICAL HISTORY:  Past Medical History:  Diagnosis Date   Arthritis    Atrial fibrillation (HCC)    Breast cancer (HCC)    Cancer (HCC)    Basal cell many times on face   CHF (congestive heart failure) (HCC)    Coronary artery disease    Dyspnea    sob when walking stairs   Dysrhythmia    AF/SSS/NSR   Fibroid    Hypertension    Menopausal symptoms    Osteoporosis 2018   T score -2.8 stable/improved from prior DEXA   Polymyalgia (HCC)    Presence of permanent cardiac pacemaker     SURGICAL HISTORY: Past Surgical History:  Procedure Laterality Date   BASAL CELL CARCINOMA EXCISION     BREAST SURGERY Left 1958   Breast Bx   CARDIAC CATHETERIZATION  2005   CARDIAC ELECTROPHYSIOLOGY STUDY AND ABLATION     FLEXIBLE SIGMOIDOSCOPY N/A 10/27/2016   Procedure: FLEXIBLE SIGMOIDOSCOPY;  Surgeon: Therisa Bi, MD;  Location: [facility] ENDOSCOPY;  Service: Gastroenterology;  Laterality: N/A;   HYSTEROSCOPY     X 2   IMPLANTABLE CARDIOVERTER DEFIBRILLATOR (ICD) GENERATOR CHANGE N/A 05/01/2018   Procedure: PACEMAKER BATTERY CHANGE CURRENT=[facility]. JUDE, NEW=MEDTRONIC;  Surgeon: Ammon Blunt, MD;  Location: [facility] ORS;  Service: Cardiovascular;  Laterality: N/A;   INSERT / REPLACE / REMOVE PACEMAKER     SKIN BIOPSY     basal cell removed many times    SOCIAL HISTORY: Social History   Socioeconomic History   Marital status: Widowed    Spouse name: Not on file   Number of children: Not on file   Years of education: Not on file   Highest education level: Not on file  Occupational History   Occupation: RN    Comment: retired  Tobacco Use   Smoking status: Never   Smokeless tobacco: Never  Vaping Use   Vaping status: Never Used  Substance and Sexual Activity   Alcohol use: Yes    Alcohol/week: 4.0 standard drinks of alcohol    Types: 4 Standard drinks or equivalent per week   Drug use: No   Sexual activity: Not Currently    Birth control/protection: Post-menopausal     Comment: 1st intercourse 62 yo-1 partner  Other Topics Concern   Not on file  Social History Narrative   Not on file   Social Drivers of Health   Financial Resource Strain: Low Risk  (11/01/2023)   Received from [facility]   Overall Financial Resource Strain (CARDIA)    Difficulty of Paying Living Expenses: Not hard at all  Food Insecurity: No Food Insecurity (11/01/2023)   Received from [facility]   Hunger Vital Sign    Within the past 12 months, you worried that your food would run out before you got the money to buy more.: Never true    Within the past 12 months, the food you bought just didn't last and you didn't have money to get more.: Never true  Transportation Needs: No Transportation Needs (11/01/2023)   Received from [facility] - Transportation    In the past 12 months, has lack of transportation kept you from medical appointments or from getting medications?: No    Lack of Transportation (Non-Medical): No  Physical Activity: Not on file  Stress: Not on file  Social Connections: Not on file  Intimate Partner Violence: Not At Risk (12/01/2022)   Humiliation, Afraid, Rape, and Kick questionnaire    Fear of Current or Ex-Partner: No    Emotionally Abused: No    Physically Abused: No    Sexually Abused: No    FAMILY HISTORY: Family History  Problem Relation Age of Onset   Hypertension Mother    Heart disease Mother    Heart disease Father    Osteoporosis Father     ALLERGIES:  is allergic to moxifloxacin and tobramycin.  MEDICATIONS:  Current Outpatient Medications  Medication Sig Dispense Refill   acetaminophen  (TYLENOL ) 500 MG tablet Take 500 mg by mouth every 6 (six) hours as needed for moderate pain.     amLODipine  (NORVASC ) 5 MG tablet Take 1 tablet (5 mg total) by mouth daily. 90 tablet 3   Biotin 1 MG CAPS Take 1 capsule by mouth daily. (Patient not taking: Reported on 09/24/2023)      cyanocobalamin (VITAMIN B12) 1000 MCG/ML injection Inject 1,000 mcg into the muscle every 30 (thirty) days.     ELIQUIS  2.5 MG TABS tablet TAKE 1 TABLET BY MOUTH TWICE A DAY 180 tablet 1   furosemide  (LASIX ) 20 MG tablet Take 1 tablet (20 mg total) by mouth daily as needed. (Patient not taking: Reported on 09/24/2023) 90 tablet 3   letrozole  (FEMARA ) 2.5 MG tablet TAKE ONE TABLET EVERY DAY 90 tablet 3   losartan  (COZAAR ) 25 MG tablet Take 1 tablet (25 mg total) by mouth daily. 90 tablet 3   Melatonin 10 MG TABS Take 10 mg by mouth as needed.     methimazole (TAPAZOLE) 5 MG tablet Take 5 mg by mouth daily.     potassium chloride  (KLOR-CON  M) 10 MEQ tablet Take 10 mEq by mouth daily as needed. (Patient not taking: Reported on 09/24/2023)     No current facility-administered medications for this visit.    PHYSICAL EXAMINATION: ECOG PERFORMANCE STATUS: 0 - Asymptomatic  Vitals:   11/27/23 1309  BP: (!) 160/87  Pulse: 88  Resp: 16  Temp: 98.7 F (37.1 C)  SpO2: 100%    Filed Weights   11/27/23 1309  Weight: 108 lb 4.8 oz (49.1 kg)    Right breast: No obvious palpable mass. No regional adenopathy.  LABORATORY DATA:  I have reviewed the data as listed Lab Results  Component Value Date   WBC 6.4 05/11/2023   HGB 13.0 05/11/2023   HCT 39.3 05/11/2023   MCV 101 (H) 05/11/2023   PLT 187 05/11/2023   Lab Results  Component Value Date   NA 140 05/11/2023   K 4.8 05/11/2023   CL 105 05/11/2023   CO2 21 05/11/2023    RADIOGRAPHIC STUDIES: I have personally reviewed the radiological reports and agreed with the findings in the report.  ASSESSMENT AND PLAN:   Malignant neoplasm of upper-outer quadrant of right breast in female, estrogen receptor positive (HCC) This is a very pleasant 89 year old female patient with newly diagnosed right breast invasive ductal carcinoma, grade 2, ER/PR positive HER2 negative referred to breast MDC for additional recommendations.  During her initial  visit, we have discussed about SOC being surgery followed by adjuvant antiestrogen therapy.  Patient gave me permission to speak to Dr. Dominick During Labor Day weekend have gotten a phone call from Dr. Dominick at [name] who happens to be a very good family friend of Ms. Jackowski and apparently the patient expressed concern over going off of anticoagulation for surgery and wanted to  proceed more conservatively.  Hence we have discussed about considering antiestrogen therapy alone. She is now on letrozole . Started on Letrozole  in September 2024.   Assessment and Plan Assessment & Plan Malignant neoplasm of right breast, responding to letrozole  Neoplasm responding well to letrozole  with significant tumor size reduction. - Continue letrozole  therapy indefinitely. - Schedule mammogram and ultrasound before next visit.  Night sweats, nausea, and constipation secondary to letrozole  therapy Night sweats, nausea, and constipation are side effects of letrozole . Current regimen may contribute to symptoms. - Advise taking letrozole  in the morning with breakfast to potentially reduce night sweats and nausea. - Continue Metamucil for constipation. - Increase water intake to help with night sweats and constipation.  Dizziness, likely multifactorial including suspected autonomic neuropathy Dizziness likely multifactorial, possibly related to autonomic neuropathy and blood pressure changes. - Continue using a walker for stability during episodes of dizziness. - Monitor blood pressure at home.     All questions were answered. The patient knows to call the clinic with any problems, questions or concerns.    Amber Stalls, MD 11/27/23

## 2023-11-27 NOTE — Assessment & Plan Note (Signed)
 This is a very pleasant 89 year old female patient with newly diagnosed right breast invasive ductal carcinoma, grade 2, ER/PR positive HER2 negative referred to breast MDC for additional recommendations.  During her initial visit, we have discussed about SOC being surgery followed by adjuvant antiestrogen therapy.  Patient gave me permission to speak to Dr. Dominick During Labor Day weekend have gotten a phone call from Dr. Dominick at [facility] who happens to be a very good family friend of Ms. Insco and apparently the patient expressed concern over going off of anticoagulation for surgery and wanted to proceed more conservatively.  Hence we have discussed about considering antiestrogen therapy alone. She is now on letrozole . Started on Letrozole  in September 2024.   Assessment and Plan Assessment & Plan Malignant neoplasm of right breast, responding to letrozole  Neoplasm responding well to letrozole  with significant tumor size reduction. - Continue letrozole  therapy indefinitely. - Schedule mammogram and ultrasound before next visit.  Night sweats, nausea, and constipation secondary to letrozole  therapy Night sweats, nausea, and constipation are side effects of letrozole . Current regimen may contribute to symptoms. - Advise taking letrozole  in the morning with breakfast to potentially reduce night sweats and nausea. - Continue Metamucil for constipation. - Increase water intake to help with night sweats and constipation.  Dizziness, likely multifactorial including suspected autonomic neuropathy Dizziness likely multifactorial, possibly related to autonomic neuropathy and blood pressure changes. - Continue using a walker for stability during episodes of dizziness. - Monitor blood pressure at home.

## 2023-11-28 ENCOUNTER — Telehealth: Payer: Self-pay | Admitting: Hematology and Oncology

## 2023-11-28 NOTE — Telephone Encounter (Signed)
 left vm for pt about scheduled appt date and time

## 2023-12-25 NOTE — Progress Notes (Signed)
 Remote PPM Transmission

## 2024-01-04 ENCOUNTER — Ambulatory Visit: Admitting: Medical

## 2024-01-28 ENCOUNTER — Other Ambulatory Visit: Payer: Self-pay | Admitting: Cardiology

## 2024-01-28 NOTE — Telephone Encounter (Signed)
 Prescription refill request for Eliquis  received. Indication: AF Last office visit: 09/24/23  GORMAN Needle NP Scr: 0.7 on 10/10/23  Epic Age: 89 Weight: 48.4kg  Based on above findings Eliquis  2.5mg  twice daily is the appropriate dose.  Refill approved.

## 2024-02-07 ENCOUNTER — Inpatient Hospital Stay: Admitting: Adult Health

## 2024-02-22 ENCOUNTER — Ambulatory Visit: Payer: Medicare Other

## 2024-02-22 DIAGNOSIS — I495 Sick sinus syndrome: Secondary | ICD-10-CM | POA: Diagnosis not present

## 2024-02-22 LAB — CUP PACEART REMOTE DEVICE CHECK
Battery Remaining Longevity: 61 mo
Battery Voltage: 2.96 V
Brady Statistic AP VP Percent: 36.17 %
Brady Statistic AP VS Percent: 12.58 %
Brady Statistic AS VP Percent: 33.22 %
Brady Statistic AS VS Percent: 17.95 %
Brady Statistic RA Percent Paced: 36.07 %
Brady Statistic RV Percent Paced: 74.8 %
Date Time Interrogation Session: 20251120222345
Implantable Lead Connection Status: 753985
Implantable Lead Connection Status: 753985
Implantable Lead Implant Date: 20100625
Implantable Lead Implant Date: 20100625
Implantable Lead Location: 753859
Implantable Lead Location: 753862
Implantable Pulse Generator Implant Date: 20200129
Lead Channel Impedance Value: 228 Ohm
Lead Channel Impedance Value: 323 Ohm
Lead Channel Impedance Value: 323 Ohm
Lead Channel Impedance Value: 456 Ohm
Lead Channel Pacing Threshold Amplitude: 0.875 V
Lead Channel Pacing Threshold Amplitude: 1.75 V
Lead Channel Pacing Threshold Pulse Width: 0.4 ms
Lead Channel Pacing Threshold Pulse Width: 0.4 ms
Lead Channel Sensing Intrinsic Amplitude: 0.75 mV
Lead Channel Sensing Intrinsic Amplitude: 0.75 mV
Lead Channel Sensing Intrinsic Amplitude: 12.25 mV
Lead Channel Sensing Intrinsic Amplitude: 12.25 mV
Lead Channel Setting Pacing Amplitude: 1.75 V
Lead Channel Setting Pacing Amplitude: 2.75 V
Lead Channel Setting Pacing Pulse Width: 0.4 ms
Lead Channel Setting Sensing Sensitivity: 0.9 mV
Zone Setting Status: 755011
Zone Setting Status: 755011

## 2024-02-24 ENCOUNTER — Ambulatory Visit: Payer: Self-pay | Admitting: Cardiology

## 2024-02-25 NOTE — Progress Notes (Signed)
 Remote PPM Transmission

## 2024-02-26 ENCOUNTER — Ambulatory Visit: Attending: Medical | Admitting: Medical

## 2024-02-26 ENCOUNTER — Encounter: Payer: Self-pay | Admitting: Medical

## 2024-02-26 VITALS — BP 130/60 | HR 80 | Ht 62.0 in | Wt 108.6 lb

## 2024-02-26 DIAGNOSIS — R600 Localized edema: Secondary | ICD-10-CM | POA: Diagnosis not present

## 2024-02-26 DIAGNOSIS — I5032 Chronic diastolic (congestive) heart failure: Secondary | ICD-10-CM | POA: Insufficient documentation

## 2024-02-26 DIAGNOSIS — I4819 Other persistent atrial fibrillation: Secondary | ICD-10-CM | POA: Diagnosis not present

## 2024-02-26 DIAGNOSIS — I10 Essential (primary) hypertension: Secondary | ICD-10-CM | POA: Diagnosis present

## 2024-02-26 DIAGNOSIS — R42 Dizziness and giddiness: Secondary | ICD-10-CM | POA: Diagnosis present

## 2024-02-26 MED ORDER — FUROSEMIDE 20 MG PO TABS: 20.0000 mg | ORAL_TABLET | Freq: Every day | ORAL | 3 refills | Status: AC

## 2024-02-26 MED ORDER — POTASSIUM CHLORIDE CRYS ER 10 MEQ PO TBCR: 10.0000 meq | EXTENDED_RELEASE_TABLET | Freq: Every day | ORAL | 3 refills | Status: AC

## 2024-02-26 NOTE — Progress Notes (Signed)
 Cardiology Office Note   Date:  02/26/2024  ID:  DEJANIRA PAMINTUAN, DOB [birth year], MRN [id number] PCP: Fernande Ophelia JINNY DOUGLAS, MD  [facility] Providers Cardiologist:  Redell Cave, MD Electrophysiologist:  OLE ONEIDA HOLTS, MD   History of Present Illness [name] is a 89 y.o. female  with a h/o persistent Afib, SSS s/p PPM, HTN, stage 2 breast cancer, and HFpEF who presents for lower leg edema.    The patient was seen 05/11/23 and had lower leg edema.she was V pacing, so suspected she was in Afib. Labs were drawn and she was given lasix . She was EP later that day and device check showed persistent Afib.    Device interrogation 2/24 showed >99% AF. Limited echo 2/25 showed LVEF 50-55%, WMA, severe LVH, mod MR, mod TR.  The patient was seen 07/2022 reporting balance issues, in and out of Afib. BP was high, so losartan  was increased.   She saw EP 09/24/23 reporting dependent edema, worse on prednisone.   Today, the patient reports worsening edema for the last 2 weeks. She has vertigo when she gets in and out of the bed. She has dizziness at random times that can occur when she walks. She occasionally uses a cane or walker. The patient denies heart racing or palpitations. She denies chest pain and shortness of breath. She gets shortn of breath when she gets Afibk, but this is not frequent.   Studies Reviewed EKG Interpretation Date/Time:  Tuesday February 26 2024 15:52:17 EST Ventricular Rate:  80 PR Interval:  192 QRS Duration:  160 QT Interval:  440 QTC Calculation: 507 R Axis:   -56  Text Interpretation: Atrial-sensed ventricular-paced rhythm When compared with ECG of 24-Sep-2023 11:05, Previous ECG has undetermined rhythm, needs review Confirmed by Franchester, Mare Ludtke ([id number]) on 02/26/2024 3:55:23 PM    Echo limited 05/2023  1. Left ventricular ejection fraction, by estimation, is 50 to 55%. Left  ventricular ejection fraction by PLAX is 52 %. The left ventricle has  low  normal function. The left ventricle demonstrates regional wall motion  abnormalities (hypokinesis of the  mid to distal anteroseptal and apical region, possibly secondary to  conduction abnormality/bundle branch block). There is severe asymmetric  left ventricular hypertrophy of the basal-septal segment, no outflow tract  gradient measured. Left ventricular  diastolic parameters are indeterminate.   2. Right ventricular systolic function is normal. The right ventricular  size is normal. There is mildly elevated pulmonary artery systolic  pressure. The estimated right ventricular systolic pressure is 36.0 mmHg.   3. Left atrial size was moderately dilated.   4. The mitral valve is normal in structure. Moderate mitral valve  regurgitation. No evidence of mitral stenosis.   5. Tricuspid valve regurgitation is moderate.   6. The aortic valve is tricuspid. There is mild calcification of the  aortic valve. Aortic valve regurgitation is mild to moderate. Aortic valve  sclerosis is present, with no evidence of aortic valve stenosis.   7. The inferior vena cava is dilated in size with >50% respiratory  variability, suggesting right atrial pressure of 8 mmHg.    Echo 11/2022 1. Left ventricular ejection fraction, by estimation, is 55 to 60%. The  left ventricle has normal function. The left ventricle has no regional  wall motion abnormalities. There is severe asymmetric left ventricular  hypertrophy of the basal-septal  segment. Left ventricular diastolic parameters are indeterminate. The  average left ventricular global longitudinal strain is -15.2 %. The global  longitudinal strain is abnormal.   2. Right ventricular systolic function is mildly reduced. The right  ventricular size is normal. There is normal pulmonary artery systolic  pressure.   3. Left atrial size was severely dilated.   4. The mitral valve is normal in structure. Mild mitral valve  regurgitation. No evidence of  mitral stenosis. Moderate mitral annular  calcification.   5. Tricuspid valve regurgitation is moderate.   6. The aortic valve is tricuspid. There is mild thickening of the aortic  valve. Aortic valve regurgitation is mild to moderate.   7. There is borderline dilatation of the ascending aorta, measuring 38  mm.   8. The inferior vena cava is normal in size with greater than 50%  respiratory variability, suggesting right atrial pressure of 3 mmHg.       Physical Exam VS:  BP 130/60 (BP Location: Left Arm, Patient Position: Sitting, Cuff Size: Normal)   Pulse 80 Comment: 70 oximeter  Ht 5' 2 (1.575 m)   Wt 108 lb 9.6 oz (49.3 kg)   SpO2 97%   BMI 19.86 kg/m        Wt Readings from Last 3 Encounters:  02/26/24 108 lb 9.6 oz (49.3 kg)  11/27/23 108 lb 4.8 oz (49.1 kg)  09/24/23 106 lb 12.8 oz (48.4 kg)    GEN: Well nourished, well developed in no acute distress NECK: No JVD; No carotid bruits CARDIAC: RRR, + murmurs, no rubs, gallops RESPIRATORY:  Clear to auscultation without rales, wheezing or rhonchi  ABDOMEN: Soft, non-tender, non-distended EXTREMITIES:  mild lower leg edema; No deformity   ASSESSMENT AND PLAN  Lower leg edema chronic diastolic heart failure Patient reports lower leg edema for the last 2 weeks. According to device checks Afib burden has increased over the last 6 months., which may be driving heart failure. She has not symptomatically noted more Afib episodes. She denies chest pain and only feels SOB when she's in Afib. Prior echo showed LVEF 50-55%, HK 2/2 LBBB, normal RV function, mod MR/TR, mild to mod AI. She has mild edema on exam. Weight is not up, but she says she has been losing weight. I will given patient lasix  20mg  + potassium to take as needed for lower leg edema. I will repeat a limited echocardiogram. Continue Losartan  25mg  daily.   Dizziness Vertigo She has h/o dizziness and vertigo. She saw PCP who prescriubed meclizine and referred to ENT.  BP is good today. Stop amlodipine  as above, which may improve dizziness if it's related to orthostasis. May also be from ?Afib, but symptom does not normally go along with Afib. She occasionally uses a walker and cane. Prior carotid US  showed no disease.   Paroxysmal Afib Sss s/p PPM Per device check Afib burden 1.89%>21.9%>27% over the last 6 months. Continue Eliquis  2.5mg BID for stroke ppx. Suspect EP would not want to add on AA for rhythm control to maintain NSR. I will check a Mag level. Continue Eliquis  5mg BID. PCP recently reduced dose of methimazole.  HTN BP is good. Stop amlodipine  as above for swelling. Continue Losartan  25mg  daily.      Dispo: Follow-up in 2 months  Signed, Maxamillian Tienda VEAR Fishman, PA-C

## 2024-02-26 NOTE — Patient Instructions (Signed)
 Medication Instructions:  Your physician recommends the following medication changes.  STOP TAKING: Amlodipine   START TAKING: Lasix  20 mg together with Potassium 10 mg, as needed for swelling.   *If you need a refill on your cardiac medications before your next appointment, please call your pharmacy*  Lab Work: Your provider would like for you to have following labs drawn today Magnesium.     Testing/Procedures: Your physician has requested that you have an echocardiogram. Echocardiography is a painless test that uses sound waves to create images of your heart. It provides your doctor with information about the size and shape of your heart and how well your heart's chambers and valves are working.   You may receive an ultrasound enhancing agent through an IV if needed to better visualize your heart during the echo. This procedure takes approximately one hour.  There are no restrictions for this procedure.  This will take place at [address] [facility]) [address], [place] [address]  Please note: We ask at that you not bring children with you during ultrasound (echo/ vascular) testing. Due to room size and safety concerns, children are not allowed in the ultrasound rooms during exams. Our front office staff cannot provide observation of children in our lobby area while testing is being conducted. An adult accompanying a patient to their appointment will only be allowed in the ultrasound room at the discretion of the ultrasound technician under special circumstances. We apologize for any inconvenience.   Follow-Up: At [facility], you and your health needs are our priority.  As part of our continuing mission to provide you with exceptional heart care, our providers are all part of one team.  This team includes your primary Cardiologist (physician) and Advanced Practice Providers or APPs (Physician Assistants and Nurse Practitioners) who all work together to provide  you with the care you need, when you need it.  Your next appointment:   2 month(s)  Provider:   Mikey Fishman, PA-C

## 2024-03-03 ENCOUNTER — Telehealth: Payer: Self-pay | Admitting: Medical

## 2024-03-03 NOTE — Telephone Encounter (Signed)
 Called patient to advise that we are still needed to have the mag lab collected - she will have these collected when she comes in for her echo

## 2024-03-03 NOTE — Telephone Encounter (Signed)
  Patient called she said last week when she saw [name] she ordered labs to check her magnesium. After her visit she went to the labs and it was already closed. She said she already started taking lasix  and would like to know if she still need to get her labs done

## 2024-03-06 ENCOUNTER — Other Ambulatory Visit: Payer: Self-pay | Admitting: Emergency Medicine

## 2024-03-06 ENCOUNTER — Ambulatory Visit: Attending: Medical

## 2024-03-06 DIAGNOSIS — R600 Localized edema: Secondary | ICD-10-CM | POA: Diagnosis not present

## 2024-03-06 DIAGNOSIS — I4819 Other persistent atrial fibrillation: Secondary | ICD-10-CM

## 2024-03-06 DIAGNOSIS — Z79899 Other long term (current) drug therapy: Secondary | ICD-10-CM

## 2024-03-06 LAB — ECHOCARDIOGRAM LIMITED
AR max vel: 2.41 cm2
AV Area VTI: 2.45 cm2
AV Area mean vel: 2.33 cm2
AV Mean grad: 5 mmHg
AV Peak grad: 10 mmHg
Ao pk vel: 1.58 m/s
MV M vel: 5.57 m/s
MV Peak grad: 124.1 mmHg
P 1/2 time: 477 ms
S' Lateral: 2.7 cm

## 2024-03-07 ENCOUNTER — Ambulatory Visit: Payer: Self-pay | Admitting: Medical

## 2024-03-07 LAB — MAGNESIUM: Magnesium: 2.4 mg/dL — ABNORMAL HIGH (ref 1.6–2.3)

## 2024-03-11 MED ORDER — EMPAGLIFLOZIN 10 MG PO TABS: 10.0000 mg | ORAL_TABLET | Freq: Every day | ORAL | 3 refills | Status: AC

## 2024-03-17 NOTE — Progress Notes (Unsigned)
°  Electrophysiology Office Follow up Visit Note:    Date:  03/19/2024   ID:  [name], DOB 09-Oct-1926, MRN [id number]  PCP:  Fernande Ophelia JINNY DOUGLAS, MD  [facility] Cardiologist:  Redell Cave, MD  [facility] Electrophysiologist:  OLE ONEIDA HOLTS, MD    Interval History:     [name] is a 89 y.o. female who presents for a follow up visit.   The patient last saw [name] 23, 2025.  She has a history of persistent atrial fibrillation, sick sinus syndrome with a permanent pacemaker in situ, hypertension, hypothyroidism, CKD 3A.  She is on Eliquis  for stroke prophylaxis.  She is family today in clinic.  She is doing well.      Past medical, surgical, social and family history were reviewed.  ROS:   Please see the history of present illness.    All other systems reviewed and are negative.  EKGs/Labs/Other Studies Reviewed:    The following studies were reviewed today:  March 19, 2024 in-clinic device interrogation personally reviewed Battery and lead parameter stable        Physical Exam:    VS:  BP 126/80 (BP Location: Left Arm, Patient Position: Sitting, Cuff Size: Normal)   Pulse 70   Ht 5' 5 (1.651 m)   Wt 106 lb (48.1 kg)   SpO2 95%   BMI 17.64 kg/m     Wt Readings from Last 3 Encounters:  03/19/24 106 lb (48.1 kg)  02/26/24 108 lb 9.6 oz (49.3 kg)  11/27/23 108 lb 4.8 oz (49.1 kg)     GEN: no distress CARD: RRR, No MRG.  Generator pocket well-healed RESP: No IWOB. CTAB.      ASSESSMENT:    1. Persistent atrial fibrillation (HCC)   2. Cardiac pacemaker in situ   3. Sick sinus syndrome (HCC)    PLAN:    In order of problems listed above:   #Symptomatic bradycardia #Permanent pacemaker in situ Device functioning appropriately.  Continue remote monitoring  #Persistent atrial fibrillation On Eliquis  for stroke prophylaxis  I discussed my upcoming departure from [name] during today's clinic appointment.   The patient will continue to follow-up with one of my EP partners moving forward.  Follow-up 1 year with EP APP  Signed, Ole Holts, MD, [facility], [facility] 03/19/2024 11:07 AM    Electrophysiology  [facility]

## 2024-03-19 ENCOUNTER — Encounter: Payer: Self-pay | Admitting: Cardiology

## 2024-03-19 ENCOUNTER — Ambulatory Visit: Attending: Cardiology | Admitting: Cardiology

## 2024-03-19 VITALS — BP 126/80 | HR 70 | Ht 65.0 in | Wt 106.0 lb

## 2024-03-19 DIAGNOSIS — I495 Sick sinus syndrome: Secondary | ICD-10-CM | POA: Insufficient documentation

## 2024-03-19 DIAGNOSIS — I4819 Other persistent atrial fibrillation: Secondary | ICD-10-CM | POA: Diagnosis present

## 2024-03-19 DIAGNOSIS — Z950 Presence of cardiac pacemaker: Secondary | ICD-10-CM | POA: Diagnosis present

## 2024-03-19 LAB — CUP PACEART INCLINIC DEVICE CHECK
Date Time Interrogation Session: 20251217114210
Implantable Lead Connection Status: 753985
Implantable Lead Connection Status: 753985
Implantable Lead Implant Date: 20100625
Implantable Lead Implant Date: 20100625
Implantable Lead Location: 753859
Implantable Lead Location: 753862
Implantable Pulse Generator Implant Date: 20200129

## 2024-03-19 NOTE — Patient Instructions (Signed)

## 2024-03-24 ENCOUNTER — Ambulatory Visit: Payer: Self-pay | Admitting: Cardiology

## 2024-04-07 ENCOUNTER — Telehealth: Payer: Self-pay

## 2024-04-07 NOTE — Telephone Encounter (Signed)
 Alert remote transmission:  1 or more monitored VT episodes detected. 3 monitored VT events, 1/3, longest duration 3sec, V>A, HR's 153-161  6 NSVT, 5 occurred 1/3  LM for patient on VM at both numbers. Need to assess symptoms, health and consider appt with Suzann Riddle NP in [place] this week.  (She has openings 1/6 and 1/7 at present).

## 2024-04-07 NOTE — Telephone Encounter (Signed)
 LM again just to clarify if patient had any symptoms on Saturday during events.  Also, to give ER precautions if any severe symptoms before appt on Wednesday.

## 2024-04-07 NOTE — Telephone Encounter (Signed)
 Spoke w/ patient - she is scheduled for 1/7 w/ EP APP. She's gonna check with transportation and call me back tomorrow to confirm appt.

## 2024-04-08 NOTE — Telephone Encounter (Signed)
 Patient returned my call inquiring about a later appt. I let her know that the 2:45 for her 1/7 appt is the last appt of the day for [name]. She states her dilemma is that she has a daughter in [place], a daughter having surgery, and a friend who is delivering meals on wheels today up until noon. She states that as soon as her friend is done delivering meals, she will call me back to confirm the appt.

## 2024-04-08 NOTE — Telephone Encounter (Signed)
 Patient returned call - she confirmed appt for tomorrow stating she will be there!

## 2024-04-09 ENCOUNTER — Ambulatory Visit: Attending: Cardiology | Admitting: Cardiology

## 2024-04-09 VITALS — BP 160/60 | HR 62 | Ht 62.0 in | Wt 110.6 lb

## 2024-04-09 DIAGNOSIS — I10 Essential (primary) hypertension: Secondary | ICD-10-CM | POA: Insufficient documentation

## 2024-04-09 DIAGNOSIS — I4819 Other persistent atrial fibrillation: Secondary | ICD-10-CM | POA: Insufficient documentation

## 2024-04-09 DIAGNOSIS — I495 Sick sinus syndrome: Secondary | ICD-10-CM | POA: Diagnosis present

## 2024-04-09 DIAGNOSIS — Z950 Presence of cardiac pacemaker: Secondary | ICD-10-CM | POA: Diagnosis not present

## 2024-04-09 DIAGNOSIS — I472 Ventricular tachycardia, unspecified: Secondary | ICD-10-CM | POA: Diagnosis present

## 2024-04-09 MED ORDER — AMIODARONE HCL 200 MG PO TABS: 200.0000 mg | ORAL_TABLET | Freq: Every day | ORAL | 3 refills | Status: AC

## 2024-04-09 NOTE — Progress Notes (Signed)
 "     Electrophysiology Clinic Note    Date:  04/09/2024  Patient ID:  [name] September 17, 1926, MRN [id number] PCP:  Fernande Ophelia JINNY DOUGLAS, MD  Cardiologist:  Redell Cave, MD  Electrophysiologist:  Fonda Kitty, MD  Electrophysiology APP:  Hazaiah Edgecombe, NP    Discussed the use of AI scribe software for clinical note transcription with the patient, who gave verbal consent to proceed.   Patient Profile    Chief Complaint: VT episode on device  History of Present Illness: [name] is a 89 y.o. female with PMH notable for persis AFib, SSS s/p PPM, HTN, hyperthyroid, CKD -3a ; seen today for Fonda Kitty, MD (Previously Dr. Cindie) for acute visit due to VT episode on PPM.    She recently saw Dr. Cindie 03/2024 at which time she was doing well. Device clinic received an alert 1/5 for VT event on PPM.   Today, she tells me that the past few weeks have been very difficult for her. Last week, a family member passed away and her son had surgery. She has also been having increased knee pain, was on PO steroid for about 2 weeks, then 2 days ago received a steroid knee injection. She is concerned about her BP, states that most readings are 160 or higher systolic for the past week. She does not recall BP readings prior to a week ago.   She denies chest pain, chest pressure. She does have intermittent palpitations that have not worsened or increased in severity in several months.  She has ongoing dizziness and separately vertigo symptoms, states they feel different. She has had stable dizziness for several months.   Her hyperthyroid is managed by [facility].      Arrhythmia/Device History MDT dual chamber PPM, imp 09/2008; dx SSS, afib [employer] RA and RV leads    AAD History: none     ROS:  Please see the history of present illness. All other systems are reviewed and otherwise negative.    Physical Exam    VS:  BP (!) 160/60 (BP Location: Left Arm, Patient  Position: Sitting, Cuff Size: Normal)   Pulse 62   Ht 5' 2 (1.575 m)   Wt 110 lb 9.6 oz (50.2 kg)   SpO2 97%   BMI 20.23 kg/m  BMI: Body mass index is 20.23 kg/m.           Wt Readings from Last 3 Encounters:  04/09/24 110 lb 9.6 oz (50.2 kg)  03/19/24 106 lb (48.1 kg)  02/26/24 108 lb 9.6 oz (49.3 kg)      GEN- The patient is thin, alert and oriented x 3 today.   Lungs- Clear to ausculation bilaterally, normal work of breathing.  Heart- Regular rate and rhythm, murmur appreciated  Extremities- Trace peripheral edema, warm, dry Skin-  device pocket well-healed, no tethering   Brief check performed without iterative lead testing/measurements Presents in [place], ongoing since 04/07/2024  Episode 9462 - 1/3 at 239pm; lasted 48 seconds VT - V>A, unclear atrial EGM vs low voltage p-waves. Appears MMVT with cycle length 390-459ms  Episode 9460 1/3 at 234pm, lasted 4+ minutes Appears MM VT     Studies Reviewed   Previous EP, cardiology notes.    EKG is ordered. Personal review of EKG from today shows:    EKG Interpretation Date/Time:  Wednesday April 09 2024 14:58:57 EST Ventricular Rate:  62 PR Interval:    QRS Duration:  150 QT Interval:  440 QTC Calculation: 446 R Axis:   -60  Text Interpretation: Ventricular-paced rhythm When compared with ECG of 26-Feb-2024 15:52, Vent. rate has decreased BY  18 BPM Confirmed by Yanique Mulvihill [phone]) on 04/09/2024 3:02:49 PM     TTE, 03/06/2024  1. Left ventricular ejection fraction, by estimation, is 45 to 50%. The left ventricle has mildly decreased function. There is mild left ventricular hypertrophy. Left ventricular diastolic parameters are indeterminate.   2. Right ventricular systolic function is normal. The right ventricular size is normal. There is mildly elevated pulmonary artery systolic pressure. The estimated right ventricular systolic pressure is 44.5 mmHg.   3. Left atrial size was severely dilated.   4. Right  atrial size was severely dilated.   5. The mitral valve is degenerative. Mild to moderate mitral valve regurgitation. No evidence of mitral stenosis. Severe mitral annular calcification.   6. The tricuspid valve is abnormal. Tricuspid valve regurgitation is moderate to severe.   7. The aortic valve is tricuspid. Aortic valve regurgitation is moderate. Aortic valve sclerosis/calcification is present, without any evidence of aortic stenosis.   8. The inferior vena cava is dilated in size with >50% respiratory variability, suggesting right atrial pressure of 8 mmHg.   Comparison(s): A prior study was performed on 05/29/2023. LV ejection fraction appears slightly worse.   TTE, 11/08/2022  1. Left ventricular ejection fraction, by estimation, is 55 to 60%. The left ventricle has normal function. The left ventricle has no regional wall motion abnormalities. There is severe asymmetric left ventricular hypertrophy of the basal-septal segment. Left ventricular diastolic parameters are indeterminate. The average left ventricular global longitudinal strain is -15.2 %. The global longitudinal strain is abnormal.   2. Right ventricular systolic function is mildly reduced. The right ventricular size is normal. There is normal pulmonary artery systolic pressure.   3. Left atrial size was severely dilated.   4. The mitral valve is normal in structure. Mild mitral valve regurgitation. No evidence of mitral stenosis. Moderate mitral annular calcification.   5. Tricuspid valve regurgitation is moderate.   6. The aortic valve is tricuspid. There is mild thickening of the aortic valve. Aortic valve regurgitation is mild to moderate.   7. There is borderline dilatation of the ascending aorta, measuring 38 mm.   8. The inferior vena cava is normal in size with greater than 50% respiratory variability, suggesting right atrial pressure of 3 mmHg.   Assessment and Plan     #) MM VT Identified via PPM Longest episode 4  minutes in duration with rates 140-150 Patient asymptomatic of episode, has long-standing dizziness that pre-dates VT episodes No chest pain, chest pressure Update CMP, mag, thyroid  labs Do not favor ischemic evaluation given no ischemic s/s and advanced age. If VT recurs, consider ischemic eval Start amiodarone  - 200mg  BID x 2 weeks, then 200mg  daily  #) persis AFib #) [place] Continues to have significant Afib burden, 11% currently Start amiodarone  as above  #) Hypercoag d/t persis afib CHA2DS2-VASc Score = at least 6 [CHF History: 1, HTN History: 1, Diabetes History: 0, Stroke History: 0, Vascular Disease History: 1, Age Score: 2, Gender Score: 1].  Therefore, the patient's annual risk of stroke is 9.7 %.    Stroke ppx - 2.5mg  eliquis  BID, appropriately dosed No bleeding concerns  #) hyperthyroid Update thyroid  labs as above Continue to monitor thyroid  function with amiodarone  initiation I've asked her to notify her endocrinologist regarding starting amidoarone  #) HTN Continue to monitor BP once  a day after medications Historically Bp under good control Likely elevated d/t stress and pain       Current medicines are reviewed at length with the patient today.   The patient does not have concerns regarding her medicines.  The following changes were made today:   START 200mg  amiodarone  - take twice a day for 2 weeks, then reduce to once a day  Labs/ tests ordered today include:  Orders Placed This Encounter  Procedures   CBC   Comprehensive metabolic panel with GFR   Magnesium   T4, free   TSH   EKG 12-Lead     Disposition: Follow up with Dr. Kennyth or EP APP in 4 weeks   Signed, Chantal Needle, NP  04/09/2024  4:49 PM  Electrophysiology [facility] "

## 2024-04-09 NOTE — Patient Instructions (Addendum)
 Medication Instructions:  Your physician recommends the following medication changes.    START TAKING: Amiodarone  200 mg twice a day for two weeks then reduce to 200 mg once a day.  *If you need a refill on your cardiac medications before your next appointment, please call your pharmacy*  Lab Work: Your provider would like for you to have following labs drawn today CBC, CMP, Magnesium, TSH and T4.     Testing/Procedures: No test ordered today   Blood Pressure Log daily, after taking medications.  Follow-Up: At [facility], you and your health needs are our priority.  As part of our continuing mission to provide you with exceptional heart care, our providers are all part of one team.  This team includes your primary Cardiologist (physician) and Advanced Practice Providers or APPs (Physician Assistants and Nurse Practitioners) who all work together to provide you with the care you need, when you need it.  Your next appointment:   1 month(s)  Provider:   Suzann Riddle, NP     Please call Endocrinology to let them know that you have started Amiodarone .

## 2024-04-10 ENCOUNTER — Other Ambulatory Visit
Admission: RE | Admit: 2024-04-10 | Discharge: 2024-04-10 | Disposition: A | Discharge status: Home / Self Care | Attending: Cardiology | Admitting: Cardiology

## 2024-04-10 ENCOUNTER — Telehealth: Payer: Self-pay

## 2024-04-10 ENCOUNTER — Other Ambulatory Visit: Payer: Self-pay | Admitting: Emergency Medicine

## 2024-04-10 ENCOUNTER — Ambulatory Visit: Payer: Self-pay | Admitting: Cardiology

## 2024-04-10 DIAGNOSIS — I495 Sick sinus syndrome: Secondary | ICD-10-CM

## 2024-04-10 LAB — COMPREHENSIVE METABOLIC PANEL WITH GFR
ALT: 14 IU/L (ref 0–32)
AST: 13 IU/L (ref 0–40)
Albumin: 4 g/dL (ref 3.6–4.6)
Alkaline Phosphatase: 74 IU/L (ref 48–129)
BUN/Creatinine Ratio: 48 — ABNORMAL HIGH (ref 12–28)
BUN: 34 mg/dL (ref 10–36)
Bilirubin Total: 1 mg/dL (ref 0.0–1.2)
CO2: 21 mmol/L (ref 20–29)
Calcium: 9.6 mg/dL (ref 8.7–10.3)
Chloride: 105 mmol/L (ref 96–106)
Creatinine, Ser: 0.71 mg/dL (ref 0.57–1.00)
Globulin, Total: 2.7 g/dL (ref 1.5–4.5)
Glucose: 97 mg/dL (ref 70–99)
Potassium: 6 mmol/L (ref 3.5–5.2)
Sodium: 139 mmol/L (ref 134–144)
Total Protein: 6.7 g/dL (ref 6.0–8.5)
eGFR: 78 mL/min/1.73

## 2024-04-10 LAB — CBC
Hematocrit: 38.1 % (ref 34.0–46.6)
Hemoglobin: 12.5 g/dL (ref 11.1–15.9)
MCH: 30 pg (ref 26.6–33.0)
MCHC: 32.8 g/dL (ref 31.5–35.7)
MCV: 92 fL (ref 79–97)
Platelets: 254 x10E3/uL (ref 150–450)
RBC: 4.16 x10E6/uL (ref 3.77–5.28)
RDW: 13.8 % (ref 11.7–15.4)
WBC: 13.4 x10E3/uL — ABNORMAL HIGH (ref 3.4–10.8)

## 2024-04-10 LAB — BASIC METABOLIC PANEL WITH GFR
Anion gap: 8 (ref 5–15)
BUN: 33 mg/dL — ABNORMAL HIGH (ref 8–23)
CO2: 25 mmol/L (ref 22–32)
Calcium: 9.5 mg/dL (ref 8.9–10.3)
Chloride: 104 mmol/L (ref 98–111)
Creatinine, Ser: 0.67 mg/dL (ref 0.44–1.00)
GFR, Estimated: >60 mL/min
Glucose, Bld: 106 mg/dL — ABNORMAL HIGH (ref 70–99)
Potassium: 5.3 mmol/L — ABNORMAL HIGH (ref 3.5–5.1)
Sodium: 137 mmol/L (ref 135–145)

## 2024-04-10 LAB — MAGNESIUM: Magnesium: 2.5 mg/dL — ABNORMAL HIGH (ref 1.6–2.3)

## 2024-04-10 LAB — TSH: TSH: 0.941 u[IU]/mL (ref 0.450–4.500)

## 2024-04-10 LAB — T4, FREE: Free T4: 1.2 ng/dL (ref 0.82–1.77)

## 2024-04-10 NOTE — Telephone Encounter (Signed)
 I spoke with Ms. Facemire with an update from Suzann Riddle, NP in regards to labs. Today's potassium recheck was improved. Patient instructed to stop taking her potassium supplement. Labs will be rechecked at patient's next appointment. Patient reports continued elevated blood pressures since Cardiology appointment yesterday, ranging from the 170's to 190's and most recently this afternoon at 190/100 on home cuff. Ms. Blima denies any adverse symptoms such as headache, vision changes or chest discomfort. Emergency room precautions discussed. Patient verbalized understanding and all questions answered at this time. Suzann Riddle, NP notified.

## 2024-04-10 NOTE — Telephone Encounter (Signed)
-----   Message from Suzann Riddle, NP sent at 04/10/2024  3:09 PM EST ----- Potassium improved on today's recheck.  I see a K supplement on medication list, let's please stop that.   We'll recheck again at next appointment.

## 2024-04-10 NOTE — Telephone Encounter (Signed)
 Notified of a elevated potassium level on routine lab monitoring (K 6.0, normal renal function, normal bicarb). Attempted to call patient and family x4 with no response. Will message ordering provider, Riddle, for follow-up.   Dorn Kapur, MD Department of Cardiology

## 2024-04-11 ENCOUNTER — Telehealth: Payer: Self-pay | Admitting: Cardiology

## 2024-04-11 NOTE — Telephone Encounter (Signed)
 Pt c/o medication issue:  1. Name of Medication: amiodarone  (PACERONE ) 200 MG tablet   2. How are you currently taking this medication (dosage and times per day)?    3. Are you having a reaction (difficulty breathing--STAT)? no  4. What is your medication issue? Patient states their has been spike in her bp while taking this medication. Calling to see if she needs to go back to her other medication. Please advise

## 2024-04-11 NOTE — Telephone Encounter (Signed)
 Called and spoke with the patient.  Patient states that her blood pressure spiked yesterday, it was 197/115.  So, she decided to take Amlodipine  5 mg and went and laid down on her bed and relaxed for a while and then rechecked her blood pressure.  And it was 150/68.  She reported BP of 164/87 for the day before yesterday and 150/68 for today.  Patient states taking the BP with the same machine in the same manner for all 3 days.  Patient would like to know if she can go back on Amlodipine  5 mg daily.  Advised the patient that her request, along with the information provided would be forwarded to Suzann Riddle, NP and we will get back to her with any advice/recommendations.  Patient expressed gratitude for the call.

## 2024-04-14 NOTE — Telephone Encounter (Signed)
 Spoke with patient and gave her instructions from Cadence [place], PA-C and Suzann Riddle, NP. Patient verbalized understanding. No further needs at this time. BP is under control at this time and she will keep her follow up appointment for 2/6.

## 2024-04-14 NOTE — Telephone Encounter (Signed)
 Pt calling back to f/u on call from 1/9 regarding elevated BP. Pt also wanted to make provider aware that she had an old prescription for Amlodipine  5 MG that she started back taking 4 days ago due to BP getting up to 193/90. Please advise

## 2024-04-17 ENCOUNTER — Telehealth: Payer: Self-pay | Admitting: Cardiology

## 2024-04-17 ENCOUNTER — Other Ambulatory Visit (HOSPITAL_COMMUNITY): Payer: Self-pay

## 2024-04-17 MED ORDER — AMLODIPINE BESYLATE 5 MG PO TABS
5.0000 mg | ORAL_TABLET | Freq: Every day | ORAL | 3 refills | Status: DC
  Filled 2024-04-17: qty 90, 90d supply, fill #0

## 2024-04-17 NOTE — Telephone Encounter (Signed)
 Pt c/o medication issue:  1. Name of Medication:    2. How are you currently taking this medication (dosage and times per day)?    3. Are you having a reaction (difficulty breathing--STAT)? no  4. What is your medication issue? Patient was calling for a refill but its not listed under medication. Please  advise

## 2024-04-17 NOTE — Telephone Encounter (Signed)
 Pt called requesting for an updated Rx for amlodipine  be sent to her pharmacy. Per the below recommendations, amlodipine  5 mg sent to pharmacy as requested.     Furth, Cadence H, PA-C to Me     04/14/24 12:16 PM Due to recent hyperkalemia, keep losartan  at current dose. Ok to restart amlodipine  5mg  daily. Lets have her come in for a visit also

## 2024-04-17 NOTE — Addendum Note (Signed)
 Addended by: DESIDERIO RUSSELL SAILOR on: 04/17/2024 12:59 PM   Modules accepted: Orders

## 2024-04-22 ENCOUNTER — Other Ambulatory Visit: Payer: Self-pay | Admitting: Cardiology

## 2024-04-29 ENCOUNTER — Other Ambulatory Visit (HOSPITAL_COMMUNITY): Payer: Self-pay

## 2024-05-09 ENCOUNTER — Ambulatory Visit: Admitting: Medical

## 2024-05-09 ENCOUNTER — Encounter: Payer: Self-pay | Admitting: Medical

## 2024-05-09 VITALS — BP 138/60 | HR 76 | Ht 62.0 in | Wt 108.0 lb

## 2024-05-09 DIAGNOSIS — I5032 Chronic diastolic (congestive) heart failure: Secondary | ICD-10-CM

## 2024-05-09 DIAGNOSIS — I472 Ventricular tachycardia, unspecified: Secondary | ICD-10-CM

## 2024-05-09 DIAGNOSIS — I4819 Other persistent atrial fibrillation: Secondary | ICD-10-CM

## 2024-05-09 DIAGNOSIS — Z950 Presence of cardiac pacemaker: Secondary | ICD-10-CM

## 2024-05-09 DIAGNOSIS — I495 Sick sinus syndrome: Secondary | ICD-10-CM

## 2024-05-09 DIAGNOSIS — I10 Essential (primary) hypertension: Secondary | ICD-10-CM

## 2024-05-09 MED ORDER — AMLODIPINE BESYLATE 2.5 MG PO TABS: 5.0000 mg | ORAL_TABLET | Freq: Every day | ORAL | 3 refills | Status: AC

## 2024-05-09 NOTE — Patient Instructions (Signed)
 Medication Instructions:  Your physician recommends the following medication changes.  DECREASE: Amlodipine  2.5 mg by mouth daily   *If you need a refill on your cardiac medications before your next appointment, please call your pharmacy*  Lab Work: No labs ordered today    Testing/Procedures: No test ordered today   Follow-Up: At [facility], you and your health needs are our priority.  As part of our continuing mission to provide you with exceptional heart care, our providers are all part of one team.  This team includes your primary Cardiologist (physician) and Advanced Practice Providers or APPs (Physician Assistants and Nurse Practitioners) who all work together to provide you with the care you need, when you need it.  Your next appointment:   3 month(s)  Provider:   Redell Cave, MD or Cadence [place], PA-C

## 2024-05-09 NOTE — Progress Notes (Unsigned)
 " Cardiology Office Note   Date:  05/09/2024  ID:  [name], DOB [birth year], MRN [id number] PCP: Fernande Ophelia JINNY DOUGLAS, MD  [facility] Providers Cardiologist:  Redell Cave, MD Electrophysiologist:  Fonda Kitty, MD  Electrophysiology APP:  Riddle, Suzann, NP   History of Present Illness [name] is a 89 y.o. female   with a h/o persistent Afib, VT, SSS s/p PPM, HTN, stage 2 breast cancer, hyperthyroidism, and HFpEF who presents for lower leg edema.    The patient was seen 05/11/23 and had lower leg edema.she was V pacing, so suspected she was in Afib. Labs were drawn and she was given lasix . She was EP later that day and device check showed persistent Afib.    Device interrogation 2/24 showed >99% AF. Limited echo 2/25 showed LVEF 50-55%, WMA, severe LVH, mod MR, mod TR.   The patient was seen 02/2024 for lower leg edema. Limited echo showed LVEF 45-50%. Amlodipine  was stopped and Jardiance  was started.  She was last seen 04/09/24 for VT event on PPM. Patient was asymptomatic. No chest pain reported. She was started on amiodarone .  Today, the patient reports elevated blood pressures. She noted systolics into the 200s. She restarted amlodipine . BP at home 120s-130s/70s on amlodipine  and losartan . She has chronic dizziness from vertigo. She denies chest pain, SOB. She has mild lower leg edema since restart amlodipine . She takes lasix  every other day.   Studies Reviewed EKG Interpretation Date/Time:  Friday May 09 2024 13:37:06 EST Ventricular Rate:  76 PR Interval:  178 QRS Duration:  176 QT Interval:  456 QTC Calculation: 513 R Axis:   -68  Text Interpretation: AV dual-paced rhythm When compared with ECG of 09-Apr-2024 14:58, Vent. rate has increased BY  14 BPM Confirmed by Franchester, Abrar Koone ([id number]) on 05/09/2024 1:46:32 PM    Limited echo 03/2024  1. Left ventricular ejection fraction, by estimation, is 45 to 50%. The  left ventricle has mildly decreased  function. There is mild left  ventricular hypertrophy. Left ventricular diastolic parameters are  indeterminate.   2. Right ventricular systolic function is normal. The right ventricular  size is normal. There is mildly elevated pulmonary artery systolic  pressure. The estimated right ventricular systolic pressure is 44.5 mmHg.   3. Left atrial size was severely dilated.   4. Right atrial size was severely dilated.   5. The mitral valve is degenerative. Mild to moderate mitral valve  regurgitation. No evidence of mitral stenosis. Severe mitral annular  calcification.   6. The tricuspid valve is abnormal. Tricuspid valve regurgitation is  moderate to severe.   7. The aortic valve is tricuspid. Aortic valve regurgitation is moderate.  Aortic valve sclerosis/calcification is present, without any evidence of  aortic stenosis.   8. The inferior vena cava is dilated in size with >50% respiratory  variability, suggesting right atrial pressure of 8 mmHg.   Comparison(s): A prior study was performed on 05/29/2023. LV ejection  fraction appears slightly worse.   Echo limited 05/2023  1. Left ventricular ejection fraction, by estimation, is 50 to 55%. Left  ventricular ejection fraction by PLAX is 52 %. The left ventricle has low  normal function. The left ventricle demonstrates regional wall motion  abnormalities (hypokinesis of the  mid to distal anteroseptal and apical region, possibly secondary to  conduction abnormality/bundle branch block). There is severe asymmetric  left ventricular hypertrophy of the basal-septal segment, no outflow tract  gradient measured. Left ventricular  diastolic  parameters are indeterminate.   2. Right ventricular systolic function is normal. The right ventricular  size is normal. There is mildly elevated pulmonary artery systolic  pressure. The estimated right ventricular systolic pressure is 36.0 mmHg.   3. Left atrial size was moderately dilated.   4. The  mitral valve is normal in structure. Moderate mitral valve  regurgitation. No evidence of mitral stenosis.   5. Tricuspid valve regurgitation is moderate.   6. The aortic valve is tricuspid. There is mild calcification of the  aortic valve. Aortic valve regurgitation is mild to moderate. Aortic valve  sclerosis is present, with no evidence of aortic valve stenosis.   7. The inferior vena cava is dilated in size with >50% respiratory  variability, suggesting right atrial pressure of 8 mmHg.      Echo 11/2022 1. Left ventricular ejection fraction, by estimation, is 55 to 60%. The  left ventricle has normal function. The left ventricle has no regional  wall motion abnormalities. There is severe asymmetric left ventricular  hypertrophy of the basal-septal  segment. Left ventricular diastolic parameters are indeterminate. The  average left ventricular global longitudinal strain is -15.2 %. The global  longitudinal strain is abnormal.   2. Right ventricular systolic function is mildly reduced. The right  ventricular size is normal. There is normal pulmonary artery systolic  pressure.   3. Left atrial size was severely dilated.   4. The mitral valve is normal in structure. Mild mitral valve  regurgitation. No evidence of mitral stenosis. Moderate mitral annular  calcification.   5. Tricuspid valve regurgitation is moderate.   6. The aortic valve is tricuspid. There is mild thickening of the aortic  valve. Aortic valve regurgitation is mild to moderate.   7. There is borderline dilatation of the ascending aorta, measuring 38  mm.   8. The inferior vena cava is normal in size with greater than 50%  respiratory variability, suggesting right atrial pressure of 3 mmHg.   Physical Exam VS:  BP 138/60 (BP Location: Left Arm, Patient Position: Sitting, Cuff Size: Normal)   Pulse 76   Ht 5' 2 (1.575 m)   Wt 108 lb (49 kg)   SpO2 96%   BMI 19.75 kg/m        Wt Readings from Last 3  Encounters:  05/09/24 108 lb (49 kg)  04/09/24 110 lb 9.6 oz (50.2 kg)  03/19/24 106 lb (48.1 kg)    GEN: Well nourished, well developed in no acute distress NECK: No JVD; No carotid bruits CARDIAC: RRR, no murmurs, rubs, gallops RESPIRATORY:  Clear to auscultation without rales, wheezing or rhonchi  ABDOMEN: Soft, non-tender, non-distended EXTREMITIES:  trace edema; No deformity   ASSESSMENT AND PLAN  HTN Amlodipine  previously stopped for lower leg edema with improvement of swelling. A month ago she noted systolics were up to 200s last month, so she restarted amlodipine  5 mg daily.  She was also taking Losartan  25mg  daily. She did not have significant symptoms with elevated blood pressure. Blood pressure improved at home, 120s - 130s/70s. Blood pressure today 138/60. She has noted lower leg edema after restarting amlodipine . We will decrease amlodipine  to 2.5 mg daily and continue losartan  25 mg daily.   HFmrEF Most recent echo showed LVEF 45-50%. Amlodipine  was stopped and she was started on Jardiance . Patient takes Lasix  every other day.  We are decreasing amlodipine  as above.  Patient wears compression socks. Continue Losartan .   Vertigo Patient has chronic dizziness/vertigo.  Persistent Afib  SSS s/p PPM VT Patient started on amiodarone  for VT. She follows with EP. Most recent AF burden 19.2%, 6 NSVT, longest 24 beats. continue Eliquis  2.5mg  BID.    Dispo: follow-up in 3 months  Signed, Seva Chancy VEAR Fishman, PA-C   "

## 2024-05-22 ENCOUNTER — Ambulatory Visit: Admitting: Cardiology

## 2024-05-23 ENCOUNTER — Ambulatory Visit

## 2024-05-30 ENCOUNTER — Inpatient Hospital Stay: Admitting: Hematology and Oncology

## 2024-08-06 ENCOUNTER — Ambulatory Visit: Admitting: Cardiology

## 2024-08-22 ENCOUNTER — Ambulatory Visit

## 2024-11-21 ENCOUNTER — Ambulatory Visit

## 2025-02-20 ENCOUNTER — Ambulatory Visit

## 2025-05-22 ENCOUNTER — Ambulatory Visit
